# Patient Record
Sex: Female | Born: 1960 | Race: White | Hispanic: No | Marital: Married | Smoking: Current every day smoker
Health system: Southern US, Community
[De-identification: ages and names within clinical notes are randomized; demographics above are authoritative.]

## PROBLEM LIST (undated history)

## (undated) DIAGNOSIS — K7460 Unspecified cirrhosis of liver: Secondary | ICD-10-CM

## (undated) DIAGNOSIS — G43909 Migraine, unspecified, not intractable, without status migrainosus: Secondary | ICD-10-CM

## (undated) DIAGNOSIS — B1920 Unspecified viral hepatitis C without hepatic coma: Secondary | ICD-10-CM

## (undated) DIAGNOSIS — E7800 Pure hypercholesterolemia, unspecified: Secondary | ICD-10-CM

## (undated) DIAGNOSIS — F419 Anxiety disorder, unspecified: Secondary | ICD-10-CM

## (undated) DIAGNOSIS — M549 Dorsalgia, unspecified: Secondary | ICD-10-CM

## (undated) DIAGNOSIS — I10 Essential (primary) hypertension: Secondary | ICD-10-CM

## (undated) DIAGNOSIS — E119 Type 2 diabetes mellitus without complications: Secondary | ICD-10-CM

## (undated) DIAGNOSIS — M1990 Unspecified osteoarthritis, unspecified site: Secondary | ICD-10-CM

## (undated) HISTORY — PX: TONSILLECTOMY: SUR1361

## (undated) HISTORY — PX: TUBAL LIGATION: SHX77

## (undated) HISTORY — PX: CHOLECYSTECTOMY: SHX55

---

## 2006-08-11 ENCOUNTER — Emergency Department (HOSPITAL_COMMUNITY): Admission: EM | Admit: 2006-08-11 | Discharge: 2006-08-11 | Payer: Self-pay | Admitting: Emergency Medicine

## 2007-08-25 ENCOUNTER — Emergency Department (HOSPITAL_COMMUNITY): Admission: EM | Admit: 2007-08-25 | Discharge: 2007-08-25 | Payer: Self-pay | Admitting: Emergency Medicine

## 2010-09-24 DIAGNOSIS — M1990 Unspecified osteoarthritis, unspecified site: Secondary | ICD-10-CM

## 2010-09-24 HISTORY — DX: Unspecified osteoarthritis, unspecified site: M19.90

## 2013-02-11 ENCOUNTER — Emergency Department (HOSPITAL_COMMUNITY)
Admission: EM | Admit: 2013-02-11 | Discharge: 2013-02-11 | Disposition: A | Discharge status: Home / Self Care | Payer: Self-pay | Attending: Emergency Medicine | Admitting: Emergency Medicine

## 2013-02-11 ENCOUNTER — Encounter (HOSPITAL_COMMUNITY): Payer: Self-pay | Admitting: Emergency Medicine

## 2013-02-11 DIAGNOSIS — IMO0002 Reserved for concepts with insufficient information to code with codable children: Secondary | ICD-10-CM | POA: Insufficient documentation

## 2013-02-11 DIAGNOSIS — Z8639 Personal history of other endocrine, nutritional and metabolic disease: Secondary | ICD-10-CM | POA: Insufficient documentation

## 2013-02-11 DIAGNOSIS — F17200 Nicotine dependence, unspecified, uncomplicated: Secondary | ICD-10-CM | POA: Insufficient documentation

## 2013-02-11 DIAGNOSIS — Z862 Personal history of diseases of the blood and blood-forming organs and certain disorders involving the immune mechanism: Secondary | ICD-10-CM | POA: Insufficient documentation

## 2013-02-11 DIAGNOSIS — Y9289 Other specified places as the place of occurrence of the external cause: Secondary | ICD-10-CM | POA: Insufficient documentation

## 2013-02-11 DIAGNOSIS — S46909A Unspecified injury of unspecified muscle, fascia and tendon at shoulder and upper arm level, unspecified arm, initial encounter: Secondary | ICD-10-CM | POA: Insufficient documentation

## 2013-02-11 DIAGNOSIS — S8990XA Unspecified injury of unspecified lower leg, initial encounter: Secondary | ICD-10-CM | POA: Insufficient documentation

## 2013-02-11 DIAGNOSIS — S99929A Unspecified injury of unspecified foot, initial encounter: Secondary | ICD-10-CM | POA: Insufficient documentation

## 2013-02-11 DIAGNOSIS — F411 Generalized anxiety disorder: Secondary | ICD-10-CM | POA: Insufficient documentation

## 2013-02-11 DIAGNOSIS — Z79899 Other long term (current) drug therapy: Secondary | ICD-10-CM | POA: Insufficient documentation

## 2013-02-11 DIAGNOSIS — R296 Repeated falls: Secondary | ICD-10-CM | POA: Insufficient documentation

## 2013-02-11 DIAGNOSIS — W19XXXA Unspecified fall, initial encounter: Secondary | ICD-10-CM

## 2013-02-11 DIAGNOSIS — Y9389 Activity, other specified: Secondary | ICD-10-CM | POA: Insufficient documentation

## 2013-02-11 DIAGNOSIS — S4980XA Other specified injuries of shoulder and upper arm, unspecified arm, initial encounter: Secondary | ICD-10-CM | POA: Insufficient documentation

## 2013-02-11 DIAGNOSIS — I10 Essential (primary) hypertension: Secondary | ICD-10-CM | POA: Insufficient documentation

## 2013-02-11 HISTORY — DX: Anxiety disorder, unspecified: F41.9

## 2013-02-11 HISTORY — DX: Pure hypercholesterolemia, unspecified: E78.00

## 2013-02-11 HISTORY — DX: Essential (primary) hypertension: I10

## 2013-02-11 MED ORDER — IBUPROFEN 800 MG PO TABS
800.0000 mg | ORAL_TABLET | Freq: Once | ORAL | Status: AC
  Administered 2013-02-11: 800 mg via ORAL
  Filled 2013-02-11: qty 1

## 2013-02-11 MED ORDER — HYDROCODONE-ACETAMINOPHEN 5-325 MG PO TABS
1.0000 | ORAL_TABLET | Freq: Once | ORAL | Status: AC
  Administered 2013-02-11: 1 via ORAL
  Filled 2013-02-11: qty 1

## 2013-02-11 MED ORDER — HYDROCODONE-ACETAMINOPHEN 5-325 MG PO TABS: 1.0000 | ORAL_TABLET | ORAL | Status: DC | PRN

## 2013-02-11 NOTE — ED Notes (Signed)
Pt alert & oriented x4, stable gait. Patient given discharge instructions, paperwork & prescription(s). Patient  instructed to stop at the registration desk to finish any additional paperwork. Patient verbalized understanding. Pt left department w/ no further questions. 

## 2013-02-11 NOTE — ED Notes (Signed)
Pt states she feel Sunday getting out the shower, reports pain has just continued to get worse.

## 2013-02-11 NOTE — ED Provider Notes (Addendum)
History     CSN: [id number]  Arrival date & time 02/11/13  0309   First MD Initiated Contact with Patient 02/11/13 0501      Chief Complaint  Patient presents with  . Fall  . Leg Pain  . Shoulder Pain  . Back Pain    (Consider location/radiation/quality/duration/timing/severity/associated sxs/prior treatment) HPI HPI Comments: Melissa Huynh is a 52 y.o. female who presents to the Emergency Department complaining of pain to her back, left arm, right leg as a result in a fall in the shower on Sunday. She fell getting out of the shower and did not feel she had hurt herself. On Monday she began to feel pain in a variety of placed and took tylenol and ibuprofen. Yesterday the pain was worse and she has been unable to sleep. She is here for pain management. She denies fever, chills, weakness, inability to use extremities.     Past Medical History  Diagnosis Date  . Hypertension   . High cholesterol   . Anxiety     Past Surgical History  Procedure Laterality Date  . Cholecystectomy    . Tonsillectomy      No family history on file.  History  Substance Use Topics  . Smoking status: Current Every Day Smoker  . Smokeless tobacco: Not on file  . Alcohol Use: No    OB History   Grav Para Term Preterm Abortions TAB SAB Ect Mult Living                  Review of Systems  Constitutional: Negative for fever.       10  Systems reviewed and are negative for acute change except as noted in the HPI.  HENT: Negative for congestion.   Eyes: Negative for discharge and redness.  Respiratory: Negative for cough and shortness of breath.   Cardiovascular: Negative for chest pain.  Gastrointestinal: Negative for vomiting and abdominal pain.  Musculoskeletal: Positive for back pain.       Left arm pain, right leg pain  Skin: Negative for rash.  Neurological: Negative for syncope, numbness and headaches.  Psychiatric/Behavioral:       No behavior change.    Allergies  Cheese;  Flexeril; Penicillins; Toradol; and Tramadol  Home Medications   Current Outpatient Rx  Name  Route  Sig  Dispense  Refill  . clonazePAM (KLONOPIN) 0.5 MG tablet   Oral   Take 0.5 mg by mouth 3 (three) times daily as needed for anxiety.         Marland Kitchen lisinopril-hydrochlorothiazide (PRINZIDE,ZESTORETIC) 20-12.5 MG per tablet   Oral   Take 1 tablet by mouth 2 (two) times daily.           BP 118/60  Pulse 124  Temp(Src) 98.2 F (36.8 C) (Oral)  Resp 18  Ht 5\' 9"  (1.753 m)  Wt 189 lb (85.73 kg)  BMI 27.9 kg/m2  SpO2 97%  LMP 01/16/2013  Physical Exam  Nursing note and vitals reviewed. Constitutional: She appears well-developed and well-nourished.  Awake, alert, nontoxic appearance.  HENT:  Head: Normocephalic and atraumatic.  Right Ear: External ear normal.  Left Ear: External ear normal.  Eyes: EOM are normal. Pupils are equal, round, and reactive to light. Right eye exhibits no discharge. Left eye exhibits no discharge.  Neck: Normal range of motion. Neck supple.  Cardiovascular: Normal rate and intact distal pulses.   Pulmonary/Chest: Effort normal and breath sounds normal. She exhibits no tenderness.  Abdominal: Soft. Bowel  sounds are normal. There is no tenderness. There is no rebound.  Musculoskeletal: She exhibits no tenderness.  Baseline ROM, no obvious new focal weakness.NO spinal tenderness with percussion. Mild lower back pain with palpation. Left arm and shoulder with FROM, no bruising or lesions noted. Right leg with FROM, no bruising or lesions.   Neurological:  Mental status and motor strength appears baseline for patient and situation.  Skin: No rash noted.  Psychiatric: She has a normal mood and affect.    ED Course  Procedures (including critical care time)   MDM  Patient with pain to back, left arm and right leg after a fall on Sunday. Given hydrocodone. Pt stable in ED with no significant deterioration in condition.The patient appears reasonably  screened and/or stabilized for discharge and I doubt any other medical condition or other [facility] requiring further screening, evaluation, or treatment in the ED at this time prior to discharge.  MDM Reviewed: nursing note and vitals           Nicoletta Dress. Colon Branch, MD 02/11/13 4782  Nicoletta Dress. Colon Branch, MD 02/11/13 [phone]

## 2013-02-11 NOTE — ED Notes (Signed)
Patient states she fell Sunday morning while getting out of shower.  Patient c/o back pain, left shoulder pain and right leg pain.

## 2013-03-29 ENCOUNTER — Other Ambulatory Visit: Payer: Self-pay

## 2013-03-29 ENCOUNTER — Encounter (HOSPITAL_COMMUNITY): Payer: Self-pay | Admitting: *Deleted

## 2013-03-29 ENCOUNTER — Emergency Department (HOSPITAL_COMMUNITY)
Admission: EM | Admit: 2013-03-29 | Discharge: 2013-03-30 | Disposition: A | Discharge status: Home / Self Care | Payer: Self-pay | Attending: Emergency Medicine | Admitting: Emergency Medicine

## 2013-03-29 DIAGNOSIS — M79609 Pain in unspecified limb: Secondary | ICD-10-CM | POA: Insufficient documentation

## 2013-03-29 DIAGNOSIS — F411 Generalized anxiety disorder: Secondary | ICD-10-CM | POA: Insufficient documentation

## 2013-03-29 DIAGNOSIS — Z880 Allergy status to penicillin: Secondary | ICD-10-CM | POA: Insufficient documentation

## 2013-03-29 DIAGNOSIS — Z862 Personal history of diseases of the blood and blood-forming organs and certain disorders involving the immune mechanism: Secondary | ICD-10-CM | POA: Insufficient documentation

## 2013-03-29 DIAGNOSIS — F17200 Nicotine dependence, unspecified, uncomplicated: Secondary | ICD-10-CM | POA: Insufficient documentation

## 2013-03-29 DIAGNOSIS — Y92009 Unspecified place in unspecified non-institutional (private) residence as the place of occurrence of the external cause: Secondary | ICD-10-CM | POA: Insufficient documentation

## 2013-03-29 DIAGNOSIS — Z8639 Personal history of other endocrine, nutritional and metabolic disease: Secondary | ICD-10-CM | POA: Insufficient documentation

## 2013-03-29 DIAGNOSIS — T25229A Burn of second degree of unspecified foot, initial encounter: Secondary | ICD-10-CM | POA: Insufficient documentation

## 2013-03-29 DIAGNOSIS — Y939 Activity, unspecified: Secondary | ICD-10-CM | POA: Insufficient documentation

## 2013-03-29 DIAGNOSIS — M549 Dorsalgia, unspecified: Secondary | ICD-10-CM | POA: Insufficient documentation

## 2013-03-29 DIAGNOSIS — Z79899 Other long term (current) drug therapy: Secondary | ICD-10-CM | POA: Insufficient documentation

## 2013-03-29 DIAGNOSIS — X088XXA Exposure to other specified smoke, fire and flames, initial encounter: Secondary | ICD-10-CM | POA: Insufficient documentation

## 2013-03-29 DIAGNOSIS — I10 Essential (primary) hypertension: Secondary | ICD-10-CM | POA: Insufficient documentation

## 2013-03-29 DIAGNOSIS — G8929 Other chronic pain: Secondary | ICD-10-CM

## 2013-03-29 MED ORDER — IBUPROFEN 800 MG PO TABS: 800.0000 mg | ORAL_TABLET | Freq: Three times a day (TID) | ORAL | Status: DC

## 2013-03-29 MED ORDER — IBUPROFEN 800 MG PO TABS: 800.0000 mg | ORAL_TABLET | Freq: Once | ORAL | Status: DC
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
## 2013-03-30 MED ORDER — ACETAMINOPHEN 500 MG PO TABS
1000.0000 mg | ORAL_TABLET | Freq: Once | ORAL | Status: AC
  Administered 2013-03-30: 1000 mg via ORAL

## 2013-11-27 ENCOUNTER — Encounter (HOSPITAL_COMMUNITY): Payer: Self-pay | Admitting: Emergency Medicine

## 2013-11-27 ENCOUNTER — Emergency Department (HOSPITAL_COMMUNITY): Payer: Self-pay

## 2013-11-27 ENCOUNTER — Emergency Department (HOSPITAL_COMMUNITY)
Admission: EM | Admit: 2013-11-27 | Discharge: 2013-11-28 | Disposition: A | Discharge status: Home / Self Care | Payer: Self-pay | Attending: Emergency Medicine | Admitting: Emergency Medicine

## 2013-11-27 DIAGNOSIS — M51369 Other intervertebral disc degeneration, lumbar region without mention of lumbar back pain or lower extremity pain: Secondary | ICD-10-CM

## 2013-11-27 DIAGNOSIS — Y9289 Other specified places as the place of occurrence of the external cause: Secondary | ICD-10-CM | POA: Insufficient documentation

## 2013-11-27 DIAGNOSIS — M5137 Other intervertebral disc degeneration, lumbosacral region: Secondary | ICD-10-CM | POA: Insufficient documentation

## 2013-11-27 DIAGNOSIS — E119 Type 2 diabetes mellitus without complications: Secondary | ICD-10-CM | POA: Insufficient documentation

## 2013-11-27 DIAGNOSIS — F411 Generalized anxiety disorder: Secondary | ICD-10-CM | POA: Insufficient documentation

## 2013-11-27 DIAGNOSIS — Z791 Long term (current) use of non-steroidal anti-inflammatories (NSAID): Secondary | ICD-10-CM | POA: Insufficient documentation

## 2013-11-27 DIAGNOSIS — M51379 Other intervertebral disc degeneration, lumbosacral region without mention of lumbar back pain or lower extremity pain: Secondary | ICD-10-CM | POA: Insufficient documentation

## 2013-11-27 DIAGNOSIS — Z79899 Other long term (current) drug therapy: Secondary | ICD-10-CM | POA: Insufficient documentation

## 2013-11-27 DIAGNOSIS — Z862 Personal history of diseases of the blood and blood-forming organs and certain disorders involving the immune mechanism: Secondary | ICD-10-CM | POA: Insufficient documentation

## 2013-11-27 DIAGNOSIS — S39012A Strain of muscle, fascia and tendon of lower back, initial encounter: Secondary | ICD-10-CM

## 2013-11-27 DIAGNOSIS — W010XXA Fall on same level from slipping, tripping and stumbling without subsequent striking against object, initial encounter: Secondary | ICD-10-CM | POA: Insufficient documentation

## 2013-11-27 DIAGNOSIS — Z880 Allergy status to penicillin: Secondary | ICD-10-CM | POA: Insufficient documentation

## 2013-11-27 DIAGNOSIS — Y9389 Activity, other specified: Secondary | ICD-10-CM | POA: Insufficient documentation

## 2013-11-27 DIAGNOSIS — I10 Essential (primary) hypertension: Secondary | ICD-10-CM | POA: Insufficient documentation

## 2013-11-27 DIAGNOSIS — W1809XA Striking against other object with subsequent fall, initial encounter: Secondary | ICD-10-CM | POA: Insufficient documentation

## 2013-11-27 DIAGNOSIS — F17200 Nicotine dependence, unspecified, uncomplicated: Secondary | ICD-10-CM | POA: Insufficient documentation

## 2013-11-27 DIAGNOSIS — Z8639 Personal history of other endocrine, nutritional and metabolic disease: Secondary | ICD-10-CM | POA: Insufficient documentation

## 2013-11-27 DIAGNOSIS — M5136 Other intervertebral disc degeneration, lumbar region: Secondary | ICD-10-CM

## 2013-11-27 DIAGNOSIS — S335XXA Sprain of ligaments of lumbar spine, initial encounter: Secondary | ICD-10-CM | POA: Insufficient documentation

## 2013-11-27 HISTORY — DX: Type 2 diabetes mellitus without complications: E11.9

## 2013-11-27 MED ORDER — HYDROCODONE-ACETAMINOPHEN 5-325 MG PO TABS: 1.0000 | ORAL_TABLET | ORAL | Status: DC | PRN

## 2013-11-27 MED ORDER — DIAZEPAM 5 MG PO TABS
5.0000 mg | ORAL_TABLET | Freq: Once | ORAL | Status: AC
Start: 2013-11-27 — End: 2013-11-27
  Administered 2013-11-27: 5 mg via ORAL
  Filled 2013-11-27: qty 1

## 2013-11-27 MED ORDER — HYDROCODONE-ACETAMINOPHEN 5-325 MG PO TABS
1.0000 | ORAL_TABLET | Freq: Once | ORAL | Status: AC
  Administered 2013-11-27: 1 via ORAL
  Filled 2013-11-27: qty 1

## 2013-11-27 MED ORDER — ONDANSETRON HCL 4 MG PO TABS
4.0000 mg | ORAL_TABLET | Freq: Once | ORAL | Status: AC
  Administered 2013-11-27: 4 mg via ORAL
  Filled 2013-11-27: qty 1

## 2013-11-27 MED ORDER — DIAZEPAM 5 MG PO TABS: 5.0000 mg | ORAL_TABLET | Freq: Three times a day (TID) | ORAL | Status: DC

## 2013-11-27 MED ORDER — MORPHINE SULFATE 4 MG/ML IJ SOLN
8.0000 mg | Freq: Once | INTRAMUSCULAR | Status: AC
  Administered 2013-11-27: 8 mg via INTRAMUSCULAR
  Filled 2013-11-27: qty 2

## 2013-11-27 NOTE — ED Provider Notes (Signed)
CSN: [id number]     Arrival date & time 11/27/13  2039 History   This chart was scribed for Ivery Quale by Ladona Ridgel Day, ED scribe. This patient was seen in room APFT21/APFT21 and the patient's care was started at 2039.  Chief Complaint  Patient presents with  . Fall   The history is provided by the patient. No language interpreter was used.   HPI Comments: Melissa Huynh is a 53 y.o. female who presents to the Emergency Department complaining of sudden onset back pain after a fall while getting out of a vehicle, slipped on ice and hit her chin on a handhold and then fell to the ground; this fall occured yesterday AM at 930, she denies any hx of chronic back problems. She takes no blood thinner medicines and has no bleeding disorders. She reports a similar previous episode years ago in which she injured her back. Today she reports painful ROM with her back and having muscle soreness all over.  Past Medical History  Diagnosis Date  . Hypertension   . High cholesterol   . Anxiety   . Diabetes mellitus without complication    Past Surgical History  Procedure Laterality Date  . Cholecystectomy    . Tonsillectomy    . Cesarean section    . Tubal ligation     History reviewed. No pertinent family history. History  Substance Use Topics  . Smoking status: Current Every Day Smoker    Types: Cigarettes  . Smokeless tobacco: Not on file  . Alcohol Use: No   OB History   Grav Para Term Preterm Abortions TAB SAB Ect Mult Living                 Review of Systems  Constitutional: Negative for fever and chills.  Respiratory: Negative for cough and shortness of breath.   Cardiovascular: Negative for chest pain.  Gastrointestinal: Negative for abdominal pain.  Musculoskeletal: Positive for back pain.  All other systems reviewed and are negative.    Allergies  Cheese; Flexeril; Penicillins; Toradol; and Tramadol  Home Medications   Current Outpatient Rx  Name  Route  Sig  Dispense   Refill  . clonazePAM (KLONOPIN) 0.5 MG tablet   Oral   Take 0.5 mg by mouth 3 (three) times daily as needed for anxiety.         Marland Kitchen ibuprofen (ADVIL,MOTRIN) 800 MG tablet   Oral   Take 1 tablet (800 mg total) by mouth 3 (three) times daily.   21 tablet   0   . lisinopril-hydrochlorothiazide (PRINZIDE,ZESTORETIC) 20-12.5 MG per tablet   Oral   Take 1 tablet by mouth 2 (two) times daily.         . metFORMIN (GLUCOPHAGE) 500 MG tablet   Oral   Take 500 mg by mouth 2 (two) times daily with a meal.         . metoprolol tartrate (LOPRESSOR) 25 MG tablet   Oral   Take 25 mg by mouth 2 (two) times daily.          Triage Vitals: BP 149/93  Pulse 64  Temp(Src) 97.5 F (36.4 C) (Oral)  Resp 20  Ht 6' (1.829 m)  Wt 185 lb (83.915 kg)  BMI 25.08 kg/m2  SpO2 99%  LMP 11/20/2013  Physical Exam  Nursing note and vitals reviewed. Constitutional: She is oriented to person, place, and time. She appears well-developed and well-nourished. No distress.  HENT:  Head: Normocephalic and atraumatic.  No mandible step off  Eyes: Conjunctivae are normal. Right eye exhibits no discharge. Left eye exhibits no discharge.  Neck: Normal range of motion.  Cardiovascular: Normal rate, regular rhythm and normal heart sounds.   No murmur heard. Pulmonary/Chest: Effort normal and breath sounds normal. No respiratory distress. She has no wheezes. She has no rales.  Abdominal: Bowel sounds are normal.  Musculoskeletal: Normal range of motion. She exhibits tenderness. She exhibits no edema.  Right and left lumbar paraspinal tenderness. Tenderness over the lumbar spine but no palpable step off. No visible hematoma of the back.   Neurological: She is alert and oriented to person, place, and time.  Skin: Skin is warm and dry.  Psychiatric: She has a normal mood and affect. Thought content normal.    ED Course  Procedures (including critical care time) DIAGNOSTIC STUDIES: Oxygen Saturation is 99%  on room air, normal by my interpretation.    COORDINATION OF CARE: At 1021 PM Discussed treatment plan with patient which includes pain medicine, lumbar spine X-ray. Patient agrees.   Labs Review Labs Reviewed - No data to display Imaging Review No results found.   EKG Interpretation None      MDM Pt sustained a fall and re-injured the lower back. Pt L spine reveals ddd. There is a healing transverse process fx . This is believed to have been present on a Feb xray. Pt [place] Rx for valium and norco given to the patient. Pt to apply heat to the lower back and rest back.   Final diagnoses:  None    *I have reviewed nursing notes, vital signs, and all appropriate lab and imaging results for this patient.**  I personally performed the services described in this documentation, which was scribed in my presence. The recorded information has been reviewed and is accurate.     Kathie DikeHobson M Kie Calvin, PA-C 11/28/13 [phone]
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
## 2013-11-27 NOTE — ED Notes (Signed)
Fell on ice when getting out of car yesterday, back pain, struck chin on door handle of car.  No LOC,  Back of head struck ground.   Neck is "stiff"

## 2013-11-28 NOTE — ED Provider Notes (Signed)
Medical screening examination/treatment/procedure(s) were performed by non-physician practitioner and as supervising physician I was immediately available for consultation/collaboration.   EKG Interpretation None        Tullio Chausse N Elyna Pangilinan, DO 11/28/13 0112 

## 2013-12-03 ENCOUNTER — Emergency Department (HOSPITAL_COMMUNITY)
Admission: EM | Admit: 2013-12-03 | Discharge: 2013-12-04 | Disposition: A | Discharge status: Home / Self Care | Payer: Self-pay | Attending: Emergency Medicine | Admitting: Emergency Medicine

## 2013-12-03 ENCOUNTER — Encounter (HOSPITAL_COMMUNITY): Payer: Self-pay | Admitting: Emergency Medicine

## 2013-12-03 DIAGNOSIS — F411 Generalized anxiety disorder: Secondary | ICD-10-CM | POA: Insufficient documentation

## 2013-12-03 DIAGNOSIS — I10 Essential (primary) hypertension: Secondary | ICD-10-CM | POA: Insufficient documentation

## 2013-12-03 DIAGNOSIS — F17200 Nicotine dependence, unspecified, uncomplicated: Secondary | ICD-10-CM | POA: Insufficient documentation

## 2013-12-03 DIAGNOSIS — R300 Dysuria: Secondary | ICD-10-CM | POA: Insufficient documentation

## 2013-12-03 DIAGNOSIS — IMO0002 Reserved for concepts with insufficient information to code with codable children: Secondary | ICD-10-CM | POA: Insufficient documentation

## 2013-12-03 DIAGNOSIS — M5416 Radiculopathy, lumbar region: Secondary | ICD-10-CM

## 2013-12-03 DIAGNOSIS — Z79899 Other long term (current) drug therapy: Secondary | ICD-10-CM | POA: Insufficient documentation

## 2013-12-03 DIAGNOSIS — Z880 Allergy status to penicillin: Secondary | ICD-10-CM | POA: Insufficient documentation

## 2013-12-03 DIAGNOSIS — E119 Type 2 diabetes mellitus without complications: Secondary | ICD-10-CM | POA: Insufficient documentation

## 2013-12-03 DIAGNOSIS — Z791 Long term (current) use of non-steroidal anti-inflammatories (NSAID): Secondary | ICD-10-CM | POA: Insufficient documentation

## 2013-12-03 MED ORDER — HYDROMORPHONE HCL PF 2 MG/ML IJ SOLN
2.0000 mg | Freq: Once | INTRAMUSCULAR | Status: AC
  Administered 2013-12-04: 2 mg via INTRAMUSCULAR
  Filled 2013-12-03: qty 1

## 2013-12-03 NOTE — ED Provider Notes (Signed)
CSN: [id number]     Arrival date & time 12/03/13  2251 History  This chart was scribed for Shon Batonourtney F Othel Dicostanzo, MD by Bennett Scrapehristina Taylor, ED Scribe. This patient was seen in room APA19/APA19 and the patient's care was started at 11:50 PM.    Chief Complaint  Patient presents with  . Back Pain    The history is provided by the patient. No language interpreter was used.    HPI Comments: Melissa Huynh is a 53 y.o. female who presents to the Emergency Department complaining of severe lower back pain that stared worsening yesterday. She was seen last week for the same back pain and had x-rays done that showed an old lower lumbar fx and DDD.  She was started on pain medications (Norco & Valium) and given a [facility]. She states that she has an appointment with specialist next week. Since then,  states that the pain has been worsening with new radiation down the right leg and new associated decreased sensation in the right foot. She has been taking IBU 800 mg every 4 to 6 hours with no improvement since running out of the pain medications yesterday. She states that she has started walking with a cane due to weakness felt secondary to the pain. At baseline, she ambulates unassisted. She denies any urinary or bowel incontinence or saddle anesthesia. She has a h/o HTN and is on 20 mg lisinopril BID and 25 mg lopressor BID.  Past Medical History  Diagnosis Date  . Hypertension   . High cholesterol   . Anxiety   . Diabetes mellitus without complication    Past Surgical History  Procedure Laterality Date  . Cholecystectomy    . Tonsillectomy    . Cesarean section    . Tubal ligation     No family history on file. History  Substance Use Topics  . Smoking status: Current Every Day Smoker    Types: Cigarettes  . Smokeless tobacco: Not on file  . Alcohol Use: No   No OB history provided.  Review of Systems  Constitutional: Negative for fever.  Respiratory: Negative for  cough, chest tightness and shortness of breath.   Cardiovascular: Negative for chest pain.  Gastrointestinal: Negative for abdominal pain.  Genitourinary: Positive for dysuria.       Denies urinary retention  Musculoskeletal: Positive for back pain and gait problem.  Skin: Negative for wound.  Neurological: Positive for weakness and numbness. Negative for headaches.  Psychiatric/Behavioral: Negative for confusion.  All other systems reviewed and are negative.      Allergies  Cheese; Flexeril; Penicillins; Toradol; and Tramadol  Home Medications   Current Outpatient Rx  Name  Route  Sig  Dispense  Refill  . clonazePAM (KLONOPIN) 0.5 MG tablet   Oral   Take 0.5 mg by mouth 3 (three) times daily as needed for anxiety.         Marland Kitchen. lisinopril-hydrochlorothiazide (PRINZIDE,ZESTORETIC) 20-12.5 MG per tablet   Oral   Take 1 tablet by mouth 2 (two) times daily.         . metFORMIN (GLUCOPHAGE) 500 MG tablet   Oral   Take 500 mg by mouth 2 (two) times daily with a meal.         . metoprolol tartrate (LOPRESSOR) 25 MG tablet   Oral   Take 25 mg by mouth 2 (two) times daily.         . diazepam (VALIUM) 5 MG tablet  Oral   Take 1 tablet (5 mg total) by mouth 3 (three) times daily after meals.   21 tablet   0   . HYDROcodone-acetaminophen (NORCO) 5-325 MG per tablet   Oral   Take 1 tablet by mouth every 4 (four) hours as needed for moderate pain.   20 tablet   0   . ibuprofen (ADVIL,MOTRIN) 800 MG tablet   Oral   Take 1 tablet (800 mg total) by mouth 3 (three) times daily.   21 tablet   0   . methylPREDNIsolone (MEDROL DOSPACK) 4 MG tablet      follow package directions   21 tablet   0   . oxyCODONE-acetaminophen (PERCOCET/ROXICET) 5-325 MG per tablet   Oral   Take 1 tablet by mouth every 6 (six) hours as needed for severe pain.   15 tablet   0    Triage Vitals: BP 128/82  Pulse 67  Temp(Src) 98.1 F (36.7 C) (Oral)  Resp 18  Ht 6' (1.829 m)  Wt 187  lb (84.823 kg)  BMI 25.36 kg/m2  SpO2 98%  LMP 11/20/2013  Physical Exam  Nursing note and vitals reviewed. Constitutional: She is oriented to person, place, and time. She appears well-developed and well-nourished.  Uncomfortable appearing  HENT:  Head: Normocephalic and atraumatic.  Cardiovascular: Normal rate, regular rhythm and normal heart sounds.   No murmur heard. Pulmonary/Chest: Effort normal and breath sounds normal. No respiratory distress. She has no wheezes.  Abdominal: Soft. There is no tenderness.  Musculoskeletal:  No midline tenderness to palpation, no step off or deformity noted  Neurological: She is alert and oriented to person, place, and time.  5 out of 5 strength in the bilateral hip flexors, knee extensors, knee flexors, plantar flexion, and dorsiflexion. 1+ bilateral lower extremity patellar reflexes. No clonus noted. Decreased sensation over the dorsum of the right foot throughout all nerve distributions.  Skin: Skin is warm and dry.  Psychiatric: She has a normal mood and affect.    ED Course  Procedures (including critical care time)  DIAGNOSTIC STUDIES: Oxygen Saturation is 98% on RA, normal by my interpretation.    COORDINATION OF CARE:   Labs Review Labs Reviewed  URINALYSIS, ROUTINE W REFLEX MICROSCOPIC   Imaging Review No results found.   EKG Interpretation None      MDM   Final diagnoses:  Lumbar radicular pain   Patient presents with back pain. Patient reports worsening pain and now is having radicular symptoms including decreased sensation of the right foot. On neurologic exam she has good strength bilaterally and intact reflexes. She does have decreased sensation but not over a specific nerve root. No evidence of cauda equina. I have reviewed patient's recent x-rays which showed an old TP fracture and degenerative disc disease. Suspect patient may have a herniated disc. Do not have access to MRI at this time. Given that the patient  does not have any evidence of acute cauda equina, feel patient can have an urgent MRI as an outpatient tomorrow. Patient was given IM Dilaudid and reports improvement of her pain. Urinalysis was obtained given her symptoms of dysuria and shows no evidence of infection. MRI was ordered. Patient was given strict return precautions.  After history, exam, and medical workup I feel the patient has been appropriately medically screened and is safe for discharge home. Pertinent diagnoses were discussed with the patient. Patient was given return precautions.      Shon Baton, MD 12/04/13 [phone]

## 2013-12-03 NOTE — ED Notes (Signed)
Pt states she was seen here a week ago and diagnosed with a   "fracture in my lower back"  Pt states she ran out of her pain meds 2 days ago and is having severe pain.

## 2013-12-04 LAB — URINALYSIS, ROUTINE W REFLEX MICROSCOPIC
Bilirubin Urine: NEGATIVE
GLUCOSE, UA: NEGATIVE mg/dL
HGB URINE DIPSTICK: NEGATIVE
KETONES UR: NEGATIVE mg/dL
LEUKOCYTES UA: NEGATIVE
Nitrite: NEGATIVE
PROTEIN: NEGATIVE mg/dL
Specific Gravity, Urine: 1.015 (ref 1.005–1.030)
UROBILINOGEN UA: 0.2 mg/dL (ref 0.0–1.0)
pH: 5.5 (ref 5.0–8.0)

## 2013-12-04 MED ORDER — OXYCODONE-ACETAMINOPHEN 5-325 MG PO TABS
1.0000 | ORAL_TABLET | Freq: Four times a day (QID) | ORAL | Status: DC | PRN
Start: 2013-12-04 — End: 2014-01-23

## 2013-12-04 MED ORDER — METHYLPREDNISOLONE (PAK) 4 MG PO TABS: ORAL_TABLET | ORAL | Status: DC

## 2013-12-04 NOTE — Discharge Instructions (Signed)
Radicular Pain  You were seen for back pain. Your noted on prior imaging to have degenerative disc disease. Her treated for her pain. If no evidence of cord impingement at this time. However, given your symptoms may have a herniated disc. MRI will be scheduled for you tomorrow.  Radicular pain in either the arm or leg is usually from a bulging or herniated disk in the spine. A piece of the herniated disk may press against the nerves as the nerves exit the spine. This causes pain which is felt at the tips of the nerves down the arm or leg. Other causes of radicular pain may include:  Fractures.  Heart disease.  Cancer.  An abnormal and usually degenerative state of the nervous system or nerves (neuropathy). Diagnosis may require CT or MRI scanning to determine the primary cause.  Nerves that start at the neck (nerve roots) may cause radicular pain in the outer shoulder and arm. It can spread down to the thumb and fingers. The symptoms vary depending on which nerve root has been affected. In most cases radicular pain improves with conservative treatment. Neck problems may require physical therapy, a neck collar, or cervical traction. Treatment may take many weeks, and surgery may be considered if the symptoms do not improve.  Conservative treatment is also recommended for sciatica. Sciatica causes pain to radiate from the lower back or buttock area down the leg into the foot. Often there is a history of back problems. Most patients with sciatica are better after 2 to 4 weeks of rest and other supportive care. Short term bed rest can reduce the disk pressure considerably. Sitting, however, is not a good position since this increases the pressure on the disk. You should avoid bending, lifting, and all other activities which make the problem worse. Traction can be used in severe cases. Surgery is usually reserved for patients who do not improve within the first months of treatment. Only take  over-the-counter or prescription medicines for pain, discomfort, or fever as directed by your caregiver. Narcotics and muscle relaxants may help by relieving more severe pain and spasm and by providing mild sedation. Cold or massage can give significant relief. Spinal manipulation is not recommended. It can increase the degree of disc protrusion. Epidural steroid injections are often effective treatment for radicular pain. These injections deliver medicine to the spinal nerve in the space between the protective covering of the spinal cord and back bones (vertebrae). Your caregiver can give you more information about steroid injections. These injections are most effective when given within two weeks of the onset of pain.  You should see your caregiver for follow up care as recommended. A program for neck and back injury rehabilitation with stretching and strengthening exercises is an important part of management.  SEEK IMMEDIATE MEDICAL CARE IF:  You develop increased pain, weakness, or numbness in your arm or leg.  You develop difficulty with bladder or bowel control.  You develop abdominal pain. Document Released: 10/18/2004 Document Revised: 12/03/2011 Document Reviewed: 01/03/2009 [facility] Patient Information 2014 [place], [place].

## 2013-12-14 ENCOUNTER — Emergency Department (HOSPITAL_COMMUNITY)
Admission: EM | Admit: 2013-12-14 | Discharge: 2013-12-14 | Disposition: A | Discharge status: Home / Self Care | Payer: Self-pay | Attending: Emergency Medicine | Admitting: Emergency Medicine

## 2013-12-14 ENCOUNTER — Encounter (HOSPITAL_COMMUNITY): Payer: Self-pay | Admitting: Emergency Medicine

## 2013-12-14 DIAGNOSIS — I10 Essential (primary) hypertension: Secondary | ICD-10-CM | POA: Insufficient documentation

## 2013-12-14 DIAGNOSIS — F411 Generalized anxiety disorder: Secondary | ICD-10-CM | POA: Insufficient documentation

## 2013-12-14 DIAGNOSIS — Z880 Allergy status to penicillin: Secondary | ICD-10-CM | POA: Insufficient documentation

## 2013-12-14 DIAGNOSIS — Z791 Long term (current) use of non-steroidal anti-inflammatories (NSAID): Secondary | ICD-10-CM | POA: Insufficient documentation

## 2013-12-14 DIAGNOSIS — G8929 Other chronic pain: Secondary | ICD-10-CM | POA: Insufficient documentation

## 2013-12-14 DIAGNOSIS — Z79899 Other long term (current) drug therapy: Secondary | ICD-10-CM | POA: Insufficient documentation

## 2013-12-14 DIAGNOSIS — M549 Dorsalgia, unspecified: Secondary | ICD-10-CM | POA: Insufficient documentation

## 2013-12-14 DIAGNOSIS — E119 Type 2 diabetes mellitus without complications: Secondary | ICD-10-CM | POA: Insufficient documentation

## 2013-12-14 DIAGNOSIS — F17200 Nicotine dependence, unspecified, uncomplicated: Secondary | ICD-10-CM | POA: Insufficient documentation

## 2013-12-14 MED ORDER — HYDROCODONE-ACETAMINOPHEN 5-325 MG PO TABS: 1.0000 | ORAL_TABLET | Freq: Four times a day (QID) | ORAL | Status: DC | PRN

## 2013-12-14 MED ORDER — MORPHINE SULFATE 4 MG/ML IJ SOLN
4.0000 mg | Freq: Once | INTRAMUSCULAR | Status: AC
  Administered 2013-12-14: 4 mg via INTRAMUSCULAR
  Filled 2013-12-14: qty 1

## 2013-12-14 NOTE — ED Notes (Signed)
C/o pain to legs while urinating but has constant pain to mid back as well, denies burning on urination, one episode of incont of urine but admits to having a "low bladder" and that bladder has dropped

## 2013-12-14 NOTE — ED Provider Notes (Signed)
CSN: [id number]     Arrival date & time 12/14/13  1121 History   First MD Initiated Contact with Patient 12/14/13 1133     Chief Complaint  Patient presents with  . Back Pain     (Consider location/radiation/quality/duration/timing/severity/associated sxs/prior Treatment) The history is provided by the patient. No language interpreter was used.  Pt is a 53 year old female who presents with back pain. He reports that this is his typical chronic back pain. He denies numbness or tingling. No urinary symptoms. He reports that he tried to get into his PCP and was unable.   Past Medical History  Diagnosis Date  . Hypertension   . High cholesterol   . Anxiety   . Diabetes mellitus without complication    Past Surgical History  Procedure Laterality Date  . Cholecystectomy    . Tonsillectomy    . Cesarean section    . Tubal ligation     No family history on file. History  Substance Use Topics  . Smoking status: Current Every Day Smoker    Types: Cigarettes  . Smokeless tobacco: Not on file  . Alcohol Use: No   OB History   Grav Para Term Preterm Abortions TAB SAB Ect Mult Living                 Review of Systems  Genitourinary: Negative for dysuria, urgency and decreased urine volume.  Musculoskeletal: Positive for back pain. Negative for gait problem and joint swelling.  Neurological: Negative for weakness and numbness.  All other systems reviewed and are negative.      Allergies  Cheese; Flexeril; Penicillins; Toradol; and Tramadol  Home Medications   Current Outpatient Rx  Name  Route  Sig  Dispense  Refill  . clonazePAM (KLONOPIN) 0.5 MG tablet   Oral   Take 0.5 mg by mouth 3 (three) times daily as needed for anxiety.         Marland Kitchen HYDROcodone-acetaminophen (NORCO) 5-325 MG per tablet   Oral   Take 1 tablet by mouth every 4 (four) hours as needed for moderate pain.   20 tablet   0   . ibuprofen (ADVIL,MOTRIN) 800 MG tablet   Oral   Take 1 tablet (800 mg  total) by mouth 3 (three) times daily.   21 tablet   0   . lisinopril-hydrochlorothiazide (PRINZIDE,ZESTORETIC) 20-12.5 MG per tablet   Oral   Take 1 tablet by mouth 2 (two) times daily.         . metFORMIN (GLUCOPHAGE) 500 MG tablet   Oral   Take 500 mg by mouth 2 (two) times daily with a meal.         . metoprolol tartrate (LOPRESSOR) 25 MG tablet   Oral   Take 25 mg by mouth 2 (two) times daily.         Marland Kitchen oxyCODONE-acetaminophen (PERCOCET/ROXICET) 5-325 MG per tablet   Oral   Take 1 tablet by mouth every 6 (six) hours as needed for severe pain.   15 tablet   0   . methylPREDNIsolone (MEDROL DOSPACK) 4 MG tablet      follow package directions   21 tablet   0    BP 115/74  Pulse 75  Temp(Src) 98.2 F (36.8 C)  Resp 18  Ht 6' (1.829 m)  Wt 180 lb (81.647 kg)  BMI 24.41 kg/m2  SpO2 98%  LMP 11/20/2013 Physical Exam  Nursing note and vitals reviewed. Constitutional: She is oriented to  person, place, and time. She appears well-developed and well-nourished. No distress.  HENT:  Head: Normocephalic and atraumatic.  Eyes: Conjunctivae and EOM are normal.  Cardiovascular: Normal rate, regular rhythm and normal heart sounds.   Pulmonary/Chest: Effort normal and breath sounds normal. No respiratory distress. She has no wheezes.  Musculoskeletal: Normal range of motion.  Neurological: She is alert and oriented to person, place, and time.  Skin: Skin is warm and dry.  Psychiatric: She has a normal mood and affect. Her behavior is normal. Judgment and thought content normal.    ED Course  Procedures (including critical care time) Labs Review Labs Reviewed - No data to display Imaging Review No results found.   EKG Interpretation None      MDM   Final diagnoses:  Back pain    Morphine injection given. No numbness or tingling. Good ROM, no urinary symptoms, good strength and sensation. No gait disturbances, focal deficits or weakness. Discussed plan of  care with pt. And advised to follow-up with PCP. Prescription for for hydrocodone given.       Irish EldersKelly Anahlia Iseminger, NP 12/18/13 1309

## 2013-12-14 NOTE — Discharge Instructions (Signed)
Back Pain, Adult Low back pain is very common. About 1 in 5 people have back pain.The cause of low back pain is rarely dangerous. The pain often gets better over time.About half of people with a sudden onset of back pain feel better in just 2 weeks. About 8 in 10 people feel better by 6 weeks.  CAUSES Some common causes of back pain include:  Strain of the muscles or ligaments supporting the spine.  Wear and tear (degeneration) of the spinal discs.  Arthritis.  Direct injury to the back. DIAGNOSIS Most of the time, the direct cause of low back pain is not known.However, back pain can be treated effectively even when the exact cause of the pain is unknown.Answering your caregiver's questions about your overall health and symptoms is one of the most accurate ways to make sure the cause of your pain is not dangerous. If your caregiver needs more information, he or she may order lab work or imaging tests (X-rays or MRIs).However, even if imaging tests show changes in your back, this usually does not require surgery. HOME CARE INSTRUCTIONS For many people, back pain returns.Since low back pain is rarely dangerous, it is often a condition that people can learn to [facility] their own.   Remain active. It is stressful on the back to sit or stand in one place. Do not sit, drive, or stand in one place for more than 30 minutes at a time. Take short walks on level surfaces as soon as pain allows.Try to increase the length of time you walk each day.  Do not stay in bed.Resting more than 1 or 2 days can delay your recovery.  Do not avoid exercise or work.Your body is made to move.It is not dangerous to be active, even though your back may hurt.Your back will likely heal faster if you return to being active before your pain is gone.  Pay attention to your body when you bend and lift. Many people have less discomfortwhen lifting if they bend their knees, keep the load close to their bodies,and  avoid twisting. Often, the most comfortable positions are those that put less stress on your recovering back.  Find a comfortable position to sleep. Use a firm mattress and lie on your side with your knees slightly bent. If you lie on your back, put a pillow under your knees.  Only take over-the-counter or prescription medicines as directed by your caregiver. Over-the-counter medicines to reduce pain and inflammation are often the most helpful.Your caregiver may prescribe muscle relaxant drugs.These medicines help dull your pain so you can more quickly return to your normal activities and healthy exercise.  Put ice on the injured area.  Put ice in a plastic bag.  Place a towel between your skin and the bag.  Leave the ice on for 15-20 minutes, 03-04 times a day for the first 2 to 3 days. After that, ice and heat may be alternated to reduce pain and spasms.  Ask your caregiver about trying back exercises and gentle massage. This may be of some benefit.  Avoid feeling anxious or stressed.Stress increases muscle tension and can worsen back pain.It is important to recognize when you are anxious or stressed and learn ways to manage it.Exercise is a great option. SEEK MEDICAL CARE IF:  You have pain that is not relieved with rest or medicine.  You have pain that does not improve in 1 week.  You have new symptoms.  You are generally not feeling well. SEEK  IMMEDIATE MEDICAL CARE IF:   You have pain that radiates from your back into your legs.  You develop new bowel or bladder control problems.  You have unusual weakness or numbness in your arms or legs.  You develop nausea or vomiting.  You develop abdominal pain.  You feel faint. Document Released: 09/10/2005 Document Revised: 03/11/2012 Document Reviewed: 01/29/2011 [facility] IncExitCare Patient Information 2014 [place]ExitCare, [place]LLC.    Follow-up with your doctor at the clinic Take meds as prescribed

## 2013-12-14 NOTE — ED Notes (Signed)
Pt here for continued back pain. Pt states she was unable to be seen at pcp office due to issues with insurance and income.

## 2013-12-14 NOTE — ED Notes (Signed)
Latrelle DodrillK. Walker, NP at bedside

## 2013-12-14 NOTE — ED Notes (Signed)
Pt with continued back pain, had an appt today but unable to see due to pt's financial status, states she finished Medrol pack from last visit and has ran out of pain meds as well

## 2013-12-21 NOTE — ED Provider Notes (Signed)
Medical screening examination/treatment/procedure(s) were performed by non-physician practitioner and as supervising physician I was immediately available for consultation/collaboration.   EKG Interpretation None        Chandlar Staebell W. Quayshawn Nin, MD 12/21/13 0737 

## 2014-01-23 ENCOUNTER — Emergency Department (HOSPITAL_COMMUNITY): Payer: Self-pay

## 2014-01-23 ENCOUNTER — Emergency Department (HOSPITAL_COMMUNITY)
Admission: EM | Admit: 2014-01-23 | Discharge: 2014-01-23 | Disposition: A | Discharge status: Home / Self Care | Payer: Self-pay | Attending: Emergency Medicine | Admitting: Emergency Medicine

## 2014-01-23 ENCOUNTER — Encounter (HOSPITAL_COMMUNITY): Payer: Self-pay | Admitting: Emergency Medicine

## 2014-01-23 DIAGNOSIS — I10 Essential (primary) hypertension: Secondary | ICD-10-CM | POA: Insufficient documentation

## 2014-01-23 DIAGNOSIS — Z79899 Other long term (current) drug therapy: Secondary | ICD-10-CM | POA: Insufficient documentation

## 2014-01-23 DIAGNOSIS — E119 Type 2 diabetes mellitus without complications: Secondary | ICD-10-CM | POA: Insufficient documentation

## 2014-01-23 DIAGNOSIS — M545 Low back pain, unspecified: Secondary | ICD-10-CM

## 2014-01-23 DIAGNOSIS — Z880 Allergy status to penicillin: Secondary | ICD-10-CM | POA: Insufficient documentation

## 2014-01-23 DIAGNOSIS — Y9389 Activity, other specified: Secondary | ICD-10-CM | POA: Insufficient documentation

## 2014-01-23 DIAGNOSIS — Y929 Unspecified place or not applicable: Secondary | ICD-10-CM | POA: Insufficient documentation

## 2014-01-23 DIAGNOSIS — F17200 Nicotine dependence, unspecified, uncomplicated: Secondary | ICD-10-CM | POA: Insufficient documentation

## 2014-01-23 DIAGNOSIS — S5001XA Contusion of right elbow, initial encounter: Secondary | ICD-10-CM

## 2014-01-23 DIAGNOSIS — IMO0002 Reserved for concepts with insufficient information to code with codable children: Secondary | ICD-10-CM | POA: Insufficient documentation

## 2014-01-23 DIAGNOSIS — S5000XA Contusion of unspecified elbow, initial encounter: Secondary | ICD-10-CM | POA: Insufficient documentation

## 2014-01-23 DIAGNOSIS — F411 Generalized anxiety disorder: Secondary | ICD-10-CM | POA: Insufficient documentation

## 2014-01-23 DIAGNOSIS — W010XXA Fall on same level from slipping, tripping and stumbling without subsequent striking against object, initial encounter: Secondary | ICD-10-CM | POA: Insufficient documentation

## 2014-01-23 MED ORDER — HYDROCODONE-ACETAMINOPHEN 5-325 MG PO TABS
ORAL_TABLET | ORAL | Status: DC
Start: 2014-01-23 — End: 2014-01-27

## 2014-01-23 MED ORDER — CYCLOBENZAPRINE HCL 10 MG PO TABS
10.0000 mg | ORAL_TABLET | Freq: Once | ORAL | Status: AC
  Administered 2014-01-23: 10 mg via ORAL
  Filled 2014-01-23: qty 1

## 2014-01-23 MED ORDER — METHOCARBAMOL 500 MG PO TABS: 1000.0000 mg | ORAL_TABLET | Freq: Three times a day (TID) | ORAL | Status: DC

## 2014-01-23 MED ORDER — OXYCODONE-ACETAMINOPHEN 5-325 MG PO TABS
1.0000 | ORAL_TABLET | Freq: Once | ORAL | Status: AC
  Administered 2014-01-23: 1 via ORAL
  Filled 2014-01-23: qty 1

## 2014-01-23 NOTE — ED Notes (Addendum)
Pt c/o mid to lower back pain and right elbow pain. Pt was moving a couch with her sister and heard her right elbow pop. This pt has taken ibuprofen and aleve with no relief.

## 2014-01-23 NOTE — ED Notes (Signed)
Pt received discharge instructions and prescriptions, verbalized understanding and has no further questions. Pt ambulated to exit in stable condition with sling in place.  Advised to return to emergency department with new or worsening symptoms.  

## 2014-01-23 NOTE — Discharge Instructions (Signed)
Contusion A contusion is a deep bruise. Contusions happen when an injury causes bleeding under the skin. Signs of bruising include pain, puffiness (swelling), and discolored skin. The contusion may turn blue, purple, or yellow. HOME CARE   Put ice on the injured area.  Put ice in a plastic bag.  Place a towel between your skin and the bag.  Leave the ice on for 15-20 minutes, 03-04 times a day.  Only take medicine as told by your doctor.  Rest the injured area.  If possible, raise (elevate) the injured area to lessen puffiness. GET HELP RIGHT AWAY IF:   You have more bruising or puffiness.  You have pain that is getting worse.  Your puffiness or pain is not helped by medicine. MAKE SURE YOU:   Understand these instructions.  Will watch your condition.  Will get help right away if you are not doing well or get worse. Document Released: 02/27/2008 Document Revised: 12/03/2011 Document Reviewed: 07/16/2011 [facility]oralExitCare Patient Information 2014 [place] CenterExitCare, [place]LLC.  Back Pain, Adult Back pain is very common. The pain often gets better over time. The cause of back pain is usually not dangerous. Most people can learn to manage their back pain on their own.  HOME CARE   Stay active. Start with short walks on flat ground if you can. Try to walk farther each day.  Do not sit, drive, or stand in one place for more than 30 minutes. Do not stay in bed.  Do not avoid exercise or work. Activity can help your back heal faster.  Be careful when you bend or lift an object. Bend at your knees, keep the object close to you, and do not twist.  Sleep on a firm mattress. Lie on your side, and bend your knees. If you lie on your back, put a pillow under your knees.  Only take medicines as told by your doctor.  Put ice on the injured area.  Put ice in a plastic bag.  Place a towel between your skin and the bag.  Leave the ice on for 15-20 minutes, 03-04 times a day for the first 2 to 3 days.  After that, you can switch between ice and heat packs.  Ask your doctor about back exercises or massage.  Avoid feeling anxious or stressed. Find good ways to deal with stress, such as exercise. GET HELP RIGHT AWAY IF:   Your pain does not go away with rest or medicine.  Your pain does not go away in 1 week.  You have new problems.  You do not feel well.  The pain spreads into your legs.  You cannot control when you poop (bowel movement) or pee (urinate).  Your arms or legs feel weak or lose feeling (numbness).  You feel sick to your stomach (nauseous) or throw up (vomit).  You have belly (abdominal) pain.  You feel like you may pass out (faint). MAKE SURE YOU:   Understand these instructions.  Will watch your condition.  Will get help right away if you are not doing well or get worse. Document Released: 02/27/2008 Document Revised: 12/03/2011 Document Reviewed: 01/29/2011 [facility]Exit[facility] Patient Information 2014 [place]ExitCare, [place]LL[place].

## 2014-01-24 NOTE — ED Provider Notes (Signed)
CSN: [id number]     Arrival date & time 01/23/14  1824 History   First MD Initiated Contact with Patient 01/23/14 1849     Chief Complaint  Patient presents with  . Elbow Pain     (Consider location/radiation/quality/duration/timing/severity/associated sxs/prior Treatment) Patient is a 53 y.o. female presenting with arm injury. The history is provided by the patient.  Arm Injury Location:  Elbow Time since incident:  1 day Injury: yes   Mechanism of injury: fall   Mechanism of injury comment:  Patient was helping someone move a sofa and slipped and fell back, landing on her lower back and right elbow. Fall:    Fall occurred:  Standing   Impact surface:  Hard floor   Point of impact:  Back (right elbow)   Entrapped after fall: no   Elbow location:  R elbow Pain details:    Quality:  Aching and throbbing   Radiates to:  Does not radiate   Severity:  Moderate   Onset quality:  Sudden   Timing:  Constant   Progression:  Unchanged Chronicity:  New Handedness:  Right-handed Dislocation: no   Foreign body present:  No foreign bodies Prior injury to area:  No Relieved by:  Nothing Worsened by:  Movement Ineffective treatments:  NSAIDs and acetaminophen Associated symptoms: back pain   Associated symptoms: no decreased range of motion, no fever, no muscle weakness, no neck pain, no numbness, no stiffness, no swelling and no tingling   Associated symptoms comment:  She also c/o pain across her lower back.  She denies dysuria, numbness or weakness, incontinence of bladder or bowel, neck pain or head injury   Past Medical History  Diagnosis Date  . Hypertension   . High cholesterol   . Anxiety   . Diabetes mellitus without complication    Past Surgical History  Procedure Laterality Date  . Cholecystectomy    . Tonsillectomy    . Cesarean section    . Tubal ligation     History reviewed. No pertinent family history. History  Substance Use Topics  . Smoking status:  Current Every Day Smoker    Types: Cigarettes  . Smokeless tobacco: Not on file  . Alcohol Use: No   OB History   Grav Para Term Preterm Abortions TAB SAB Ect Mult Living                 Review of Systems  Constitutional: Negative for fever.  Respiratory: Negative for shortness of breath.   Gastrointestinal: Negative for vomiting, abdominal pain and constipation.  Genitourinary: Negative for dysuria, hematuria, flank pain, decreased urine volume and difficulty urinating.       No perineal numbness or incontinence of urine or feces  Musculoskeletal: Positive for arthralgias and back pain. Negative for joint swelling, neck pain and stiffness.       Right elbow pain  Skin: Negative for rash.  Neurological: Negative for dizziness, syncope, weakness, numbness and headaches.  All other systems reviewed and are negative.     Allergies  Cheese; Flexeril; Penicillins; Toradol; and Tramadol  Home Medications   Prior to Admission medications   Medication Sig Start Date End Date Taking? Authorizing Provider  clonazePAM (KLONOPIN) 0.5 MG tablet Take 0.5 mg by mouth 3 (three) times daily as needed for anxiety.   Yes Historical Provider, MD  ibuprofen (ADVIL,MOTRIN) 200 MG tablet Take 800 mg by mouth every 6 (six) hours as needed.   Yes Historical Provider, MD  lisinopril-hydrochlorothiazide (PRINZIDE,ZESTORETIC) 20-12.5  MG per tablet Take 1 tablet by mouth 2 (two) times daily.   Yes Historical Provider, MD  metFORMIN (GLUCOPHAGE) 500 MG tablet Take 500 mg by mouth 2 (two) times daily with a meal.   Yes Historical Provider, MD  metoprolol tartrate (LOPRESSOR) 25 MG tablet Take 25 mg by mouth 2 (two) times daily.   Yes Historical Provider, MD  HYDROcodone-acetaminophen (NORCO/VICODIN) 5-325 MG per tablet Take one-two tabs po q 4-6 hrs prn pain 01/23/14   Sagrario Lineberry L. Temisha Murley, PA-C  HYDROcodone-acetaminophen (NORCO/VICODIN) 5-325 MG per tablet Take one-two tabs po q 4-6 hrs prn pain 01/23/14   Shaneen Reeser L.  Divina Neale, PA-C  methocarbamol (ROBAXIN) 500 MG tablet Take 2 tablets (1,000 mg total) by mouth 3 (three) times daily. 01/23/14   Lebert Lovern L. Merlin Ege, PA-C   BP 156/95  Pulse 104  Temp(Src) 98.1 F (36.7 C) (Oral)  Resp 20  SpO2 99%  LMP 01/18/2014 Physical Exam  Nursing note and vitals reviewed. Constitutional: She is oriented to person, place, and time. She appears well-developed and well-nourished. No distress.  HENT:  Head: Normocephalic and atraumatic.  Neck: Normal range of motion. Neck supple.  Cardiovascular: Normal rate, regular rhythm, normal heart sounds and intact distal pulses.   No murmur heard. Pulmonary/Chest: Effort normal and breath sounds normal. No respiratory distress.  Abdominal: Soft. She exhibits no distension. There is no tenderness.  Musculoskeletal: She exhibits tenderness. She exhibits no edema.       Right elbow: She exhibits normal range of motion, no swelling, no effusion and no deformity. Tenderness found. Olecranon process tenderness noted.       Lumbar back: She exhibits tenderness and pain. She exhibits normal range of motion, no bony tenderness, no swelling, no deformity, no laceration and normal pulse.  ttp of the lumbar paraspinal muscles.  No spinal tenderness.  DP pulses are brisk and symmetrical.  Distal sensation intact.  Hip Flexors/Extensors are intact.  Pt has normal strength against resistance of bilateral lower extremities.   Pt has ttp of the olecranon process and lateral epicondyle of the right elbow.  Pt has full ROM of the elbow with mild to moderate crepitus throughout ROM.  Distal sensation intact, grip strength is strong and symmetrical.  No erythema or edema of the bursa  Neurological: She is alert and oriented to person, place, and time. She has normal strength. No sensory deficit. She exhibits normal muscle tone. Coordination and gait normal.  Reflex Scores:      Patellar reflexes are 2+ on the right side and 2+ on the left side.       Achilles reflexes are 2+ on the right side and 2+ on the left side. Skin: Skin is warm and dry. No rash noted.    ED Course  Procedures (including critical care time) Labs Review Labs Reviewed - No data to display  Imaging Review Dg Lumbar Spine Complete  01/23/2014   CLINICAL DATA:  Mid to lower back pain after moving a couch.  EXAM: LUMBAR SPINE - COMPLETE 4+ VIEW  COMPARISON:  11/27/2013.  FINDINGS: Five non-rib-bearing lumbar vertebrae. Anterior and lateral spur formation at multiple levels with disc space narrowing and vacuum phenomena. No fractures, pars defects or subluxations. Mild atheromatous arterial calcifications. Cholecystectomy clips.  IMPRESSION: 1. No fracture or subluxation. 2. Multilevel degenerative changes.   Electronically Signed   By: Gordan PaymentSteve  Reid M.D.   On: 01/23/2014 19:35   Dg Elbow Complete Right  01/23/2014   CLINICAL DATA:  Right elbow  pain after moving a couch and hearing a popping sound.  EXAM: RIGHT ELBOW - COMPLETE 3+ VIEW  COMPARISON:  None.  FINDINGS: Moderate fragmented anterior spur formation with corticated margins involving the radial head and distal humerus. There is also spur formation at the articulation of the radial head and proximal ulna. There is also radial subluxation of the radial head relative to the capitellum. No fracture or effusion seen.  IMPRESSION: Chronic changes, as described above, most likely due to old trauma. No acute fracture seen.   Electronically Signed   By: Gordan PaymentSteve  Reid M.D.   On: 01/23/2014 19:39     EKG Interpretation None      MDM   Final diagnoses:  Contusion of elbow, right  Low back pain    Pt is well appearing, non-toxic.  NV intact.  Ambulates with steady gait.  No concerning sx's for septic joint.  Sling given for comfort and XR results discussed with pt.  She denies previous injury to the elbow.  Referral info given for ortho.  Pt agrees to symptomatic tx and close f/u.  She appears stable for d/c and agrees to plan  including vicodin and robaxin.      Renly Roots L. Ima Hafner, PA-C 01/24/14 2359

## 2014-01-25 MED FILL — Hydrocodone-Acetaminophen Tab 5-325 MG: ORAL | Qty: 6 | Status: AC

## 2014-01-25 NOTE — ED Provider Notes (Signed)
Medical screening examination/treatment/procedure(s) were performed by non-physician practitioner and as supervising physician I was immediately available for consultation/collaboration.   EKG Interpretation None        Laray AngerKathleen M Dearion Huot, DO 01/25/14 1351

## 2014-01-27 ENCOUNTER — Encounter (HOSPITAL_COMMUNITY): Payer: Self-pay | Admitting: Emergency Medicine

## 2014-01-27 ENCOUNTER — Emergency Department (HOSPITAL_COMMUNITY)
Admission: EM | Admit: 2014-01-27 | Discharge: 2014-01-27 | Disposition: A | Discharge status: Home / Self Care | Payer: Self-pay | Attending: Emergency Medicine | Admitting: Emergency Medicine

## 2014-01-27 DIAGNOSIS — F411 Generalized anxiety disorder: Secondary | ICD-10-CM | POA: Insufficient documentation

## 2014-01-27 DIAGNOSIS — R296 Repeated falls: Secondary | ICD-10-CM | POA: Insufficient documentation

## 2014-01-27 DIAGNOSIS — M549 Dorsalgia, unspecified: Secondary | ICD-10-CM

## 2014-01-27 DIAGNOSIS — Z880 Allergy status to penicillin: Secondary | ICD-10-CM | POA: Insufficient documentation

## 2014-01-27 DIAGNOSIS — E119 Type 2 diabetes mellitus without complications: Secondary | ICD-10-CM | POA: Insufficient documentation

## 2014-01-27 DIAGNOSIS — I10 Essential (primary) hypertension: Secondary | ICD-10-CM | POA: Insufficient documentation

## 2014-01-27 DIAGNOSIS — Y939 Activity, unspecified: Secondary | ICD-10-CM | POA: Insufficient documentation

## 2014-01-27 DIAGNOSIS — F17200 Nicotine dependence, unspecified, uncomplicated: Secondary | ICD-10-CM | POA: Insufficient documentation

## 2014-01-27 DIAGNOSIS — Y929 Unspecified place or not applicable: Secondary | ICD-10-CM | POA: Insufficient documentation

## 2014-01-27 DIAGNOSIS — Z79899 Other long term (current) drug therapy: Secondary | ICD-10-CM | POA: Insufficient documentation

## 2014-01-27 DIAGNOSIS — IMO0002 Reserved for concepts with insufficient information to code with codable children: Secondary | ICD-10-CM | POA: Insufficient documentation

## 2014-01-27 MED ORDER — HYDROCODONE-ACETAMINOPHEN 5-325 MG PO TABS: 1.0000 | ORAL_TABLET | Freq: Four times a day (QID) | ORAL | Status: DC | PRN

## 2014-01-27 NOTE — ED Notes (Signed)
Pt c/o back pain that began one week ago after a fall. Pt was seen here last week and given pain medications for home. Pt states she is out of her meds and the pain has not improved.

## 2014-01-27 NOTE — ED Provider Notes (Signed)
Medical screening examination/treatment/procedure(s) were performed by non-physician practitioner and as supervising physician I was immediately available for consultation/collaboration.   EKG Interpretation None       Jamisyn Langer, MD 01/27/14 1455 

## 2014-01-27 NOTE — Discharge Instructions (Signed)
Back Pain, Adult Low back pain is very common. About 1 in 5 people have back pain.The cause of low back pain is rarely dangerous. The pain often gets better over time.About half of people with a sudden onset of back pain feel better in just 2 weeks. About 8 in 10 people feel better by 6 weeks.  CAUSES Some common causes of back pain include:  Strain of the muscles or ligaments supporting the spine.  Wear and tear (degeneration) of the spinal discs.  Arthritis.  Direct injury to the back. DIAGNOSIS Most of the time, the direct cause of low back pain is not known.However, back pain can be treated effectively even when the exact cause of the pain is unknown.Answering your caregiver's questions about your overall health and symptoms is one of the most accurate ways to make sure the cause of your pain is not dangerous. If your caregiver needs more information, he or she may order lab work or imaging tests (X-rays or MRIs).However, even if imaging tests show changes in your back, this usually does not require surgery. HOME CARE INSTRUCTIONS For many people, back pain returns.Since low back pain is rarely dangerous, it is often a condition that people can learn to manageon their own.   Remain active. It is stressful on the back to sit or stand in one place. Do not sit, drive, or stand in one place for more than 30 minutes at a time. Take short walks on level surfaces as soon as pain allows.Try to increase the length of time you walk each day.  Do not stay in bed.Resting more than 1 or 2 days can delay your recovery.  Do not avoid exercise or work.Your body is made to move.It is not dangerous to be active, even though your back may hurt.Your back will likely heal faster if you return to being active before your pain is gone.  Pay attention to your body when you bend and lift. Many people have less discomfortwhen lifting if they bend their knees, keep the load close to their bodies,and  avoid twisting. Often, the most comfortable positions are those that put less stress on your recovering back.  Find a comfortable position to sleep. Use a firm mattress and lie on your side with your knees slightly bent. If you lie on your back, put a pillow under your knees.  Only take over-the-counter or prescription medicines as directed by your caregiver. Over-the-counter medicines to reduce pain and inflammation are often the most helpful.Your caregiver may prescribe muscle relaxant drugs.These medicines help dull your pain so you can more quickly return to your normal activities and healthy exercise.  Put ice on the injured area.  Put ice in a plastic bag.  Place a towel between your skin and the bag.  Leave the ice on for 15-20 minutes, 03-04 times a day for the first 2 to 3 days. After that, ice and heat may be alternated to reduce pain and spasms.  Ask your caregiver about trying back exercises and gentle massage. This may be of some benefit.  Avoid feeling anxious or stressed.Stress increases muscle tension and can worsen back pain.It is important to recognize when you are anxious or stressed and learn ways to manage it.Exercise is a great option. SEEK MEDICAL CARE IF:  You have pain that is not relieved with rest or medicine.  You have pain that does not improve in 1 week.  You have new symptoms.  You are generally not feeling well. SEEK   IMMEDIATE MEDICAL CARE IF:   You have pain that radiates from your back into your legs.  You develop new bowel or bladder control problems.  You have unusual weakness or numbness in your arms or legs.  You develop nausea or vomiting.  You develop abdominal pain.  You feel faint. Document Released: 09/10/2005 Document Revised: 03/11/2012 Document Reviewed: 01/29/2011 [employer] Patient Information 2014 [employer].  

## 2014-01-27 NOTE — ED Provider Notes (Signed)
CSN: [id number]     Arrival date & time 01/27/14  1330 History   First MD Initiated Contact with Patient 01/27/14 1339     Chief Complaint  Patient presents with  . Back Pain     (Consider location/radiation/quality/duration/timing/severity/associated sxs/prior Treatment) HPI Comments: Pt c/o lower back and elbow pain that started after a fall a week ago. Pt denies any numbness, weakness or incontinence. Pt states that seh was given something for pain and she is out and she is continuing to have pain. She states that she doesn't have a pcp so she came in here today.   The history is provided by the patient. No language interpreter was used.    Past Medical History  Diagnosis Date  . Hypertension   . High cholesterol   . Anxiety   . Diabetes mellitus without complication    Past Surgical History  Procedure Laterality Date  . Cholecystectomy    . Tonsillectomy    . Cesarean section    . Tubal ligation     History reviewed. No pertinent family history. History  Substance Use Topics  . Smoking status: Current Every Day Smoker    Types: Cigarettes  . Smokeless tobacco: Not on file  . Alcohol Use: No   OB History   Grav Para Term Preterm Abortions TAB SAB Ect Mult Living                 Review of Systems  Constitutional: Negative.   Respiratory: Negative.   Cardiovascular: Negative.       Allergies  Cheese; Flexeril; Penicillins; Toradol; and Tramadol  Home Medications   Prior to Admission medications   Medication Sig Start Date End Date Taking? Authorizing Provider  clonazePAM (KLONOPIN) 0.5 MG tablet Take 0.5 mg by mouth 3 (three) times daily as needed for anxiety.   Yes Historical Provider, MD  ibuprofen (ADVIL,MOTRIN) 200 MG tablet Take 800 mg by mouth every 6 (six) hours as needed for moderate pain.    Yes Historical Provider, MD  lisinopril-hydrochlorothiazide (PRINZIDE,ZESTORETIC) 20-12.5 MG per tablet Take 1 tablet by mouth 2 (two) times daily.   Yes  Historical Provider, MD  metFORMIN (GLUCOPHAGE) 500 MG tablet Take 500 mg by mouth 2 (two) times daily with a meal.   Yes Historical Provider, MD  methocarbamol (ROBAXIN) 500 MG tablet Take 2 tablets (1,000 mg total) by mouth 3 (three) times daily. 01/23/14  Yes Tammy L. Triplett, PA-C  metoprolol tartrate (LOPRESSOR) 25 MG tablet Take 25 mg by mouth 2 (two) times daily.   Yes Historical Provider, MD  HYDROcodone-acetaminophen (NORCO/VICODIN) 5-325 MG per tablet Take 1-2 tablets by mouth every 6 (six) hours as needed. 01/27/14   Teressa LowerVrinda Alorah Mcree, NP   BP 179/105  Pulse 105  Temp(Src) 98.3 F (36.8 C) (Oral)  Resp 18  Ht 6' (1.829 m)  Wt 182 lb (82.555 kg)  BMI 24.68 kg/m2  SpO2 95%  LMP 01/18/2014 Physical Exam  Nursing note and vitals reviewed. Constitutional: She is oriented to person, place, and time. She appears well-developed and well-nourished.  Cardiovascular: Normal rate and regular rhythm.   Pulmonary/Chest: Effort normal.  Musculoskeletal:  Lumbar spine tenderness  Neurological: She is alert and oriented to person, place, and time. Coordination normal.  Moves all extremities without any problem. Strength is equall  Skin: Skin is warm and dry.  Psychiatric: She has a normal mood and affect.    ED Course  Procedures (including critical care time) Labs Review Labs Reviewed -  No data to display  Imaging Review No results found.   EKG Interpretation None      MDM   Final diagnoses:  Back pain    Will treat with a couple more hydrocodone. Discussed with pt that she needs to follow up and we will not give her more pain medication for this     Teressa LowerVrinda Dashiel Bergquist, NP 01/27/14 1357

## 2014-02-09 ENCOUNTER — Emergency Department (HOSPITAL_COMMUNITY)
Admission: EM | Admit: 2014-02-09 | Discharge: 2014-02-09 | Disposition: A | Discharge status: Home / Self Care | Payer: Self-pay | Attending: Emergency Medicine | Admitting: Emergency Medicine

## 2014-02-09 ENCOUNTER — Encounter (HOSPITAL_COMMUNITY): Payer: Self-pay | Admitting: Emergency Medicine

## 2014-02-09 ENCOUNTER — Emergency Department (HOSPITAL_COMMUNITY): Payer: Self-pay

## 2014-02-09 DIAGNOSIS — I10 Essential (primary) hypertension: Secondary | ICD-10-CM | POA: Insufficient documentation

## 2014-02-09 DIAGNOSIS — Z79899 Other long term (current) drug therapy: Secondary | ICD-10-CM | POA: Insufficient documentation

## 2014-02-09 DIAGNOSIS — S59919A Unspecified injury of unspecified forearm, initial encounter: Secondary | ICD-10-CM

## 2014-02-09 DIAGNOSIS — IMO0002 Reserved for concepts with insufficient information to code with codable children: Secondary | ICD-10-CM | POA: Insufficient documentation

## 2014-02-09 DIAGNOSIS — W19XXXA Unspecified fall, initial encounter: Secondary | ICD-10-CM

## 2014-02-09 DIAGNOSIS — F17200 Nicotine dependence, unspecified, uncomplicated: Secondary | ICD-10-CM | POA: Insufficient documentation

## 2014-02-09 DIAGNOSIS — S59901A Unspecified injury of right elbow, initial encounter: Secondary | ICD-10-CM

## 2014-02-09 DIAGNOSIS — E119 Type 2 diabetes mellitus without complications: Secondary | ICD-10-CM | POA: Insufficient documentation

## 2014-02-09 DIAGNOSIS — F411 Generalized anxiety disorder: Secondary | ICD-10-CM | POA: Insufficient documentation

## 2014-02-09 DIAGNOSIS — Y9301 Activity, walking, marching and hiking: Secondary | ICD-10-CM | POA: Insufficient documentation

## 2014-02-09 DIAGNOSIS — S3992XA Unspecified injury of lower back, initial encounter: Secondary | ICD-10-CM

## 2014-02-09 DIAGNOSIS — S6990XA Unspecified injury of unspecified wrist, hand and finger(s), initial encounter: Secondary | ICD-10-CM

## 2014-02-09 DIAGNOSIS — S59909A Unspecified injury of unspecified elbow, initial encounter: Secondary | ICD-10-CM | POA: Insufficient documentation

## 2014-02-09 DIAGNOSIS — Y9289 Other specified places as the place of occurrence of the external cause: Secondary | ICD-10-CM | POA: Insufficient documentation

## 2014-02-09 DIAGNOSIS — Z880 Allergy status to penicillin: Secondary | ICD-10-CM | POA: Insufficient documentation

## 2014-02-09 DIAGNOSIS — W108XXA Fall (on) (from) other stairs and steps, initial encounter: Secondary | ICD-10-CM | POA: Insufficient documentation

## 2014-02-09 MED ORDER — METHOCARBAMOL 500 MG PO TABS: 500.0000 mg | ORAL_TABLET | Freq: Two times a day (BID) | ORAL | Status: DC | PRN

## 2014-02-09 MED ORDER — METHOCARBAMOL 500 MG PO TABS
500.0000 mg | ORAL_TABLET | Freq: Once | ORAL | Status: AC
  Administered 2014-02-09: 500 mg via ORAL
  Filled 2014-02-09: qty 1

## 2014-02-09 MED ORDER — ACETAMINOPHEN 500 MG PO TABS: 500.0000 mg | ORAL_TABLET | Freq: Four times a day (QID) | ORAL | Status: DC | PRN

## 2014-02-09 NOTE — ED Notes (Signed)
Onset Saturday morning pt was helping sister move furniture, pt was walking backwards up steps fell on left knee and right elbow then hit mid back on steps.  Pain from mid back down to tailbone.  Pt has been taking Ibuprofen, last dose 800 mg @ 8am today, Aleve x 2 @ 10:30am.  Pain getting worse.  Pain to lateral side of right hip to right knee when laying on right side only.  No bowel/bladder problems.

## 2014-02-09 NOTE — ED Notes (Signed)
Also, c/o right elbow pain.

## 2014-02-09 NOTE — ED Provider Notes (Signed)
CSN: [id number]     Arrival date & time 02/09/14  1224 History  This chart was scribed for non-physician practitioner, Emilia BeckKaitlyn Amarrion Pastorino, PA-C working with [name], MD by [name], ED scribe. This patient was seen in room TR10C/TR10C and the patient's care was started at 2:47 PM.   Chief Complaint  Patient presents with  . Fall  . Back Pain   The history is provided by the patient. No language interpreter was used.   HPI Comments: Melissa Huynh is a 53 y.o. female who presents to the Emergency Department complaining of a fall that occurred 3 days ago. Pt was helping her sister move furniture and fell while walking backwards up the steps. She landed on her left knee, right elbow and lower back having sudden onset, worsening pain. States the back pain intermittently radiates into her right buttock and leg. Pt has taken 800 mg ibuprofen and aleve with no relief. Takes the ibuprofen every 4-6 hours and aleve every 6 hours. Certain movements worsen the pain.   Past Medical History  Diagnosis Date  . Hypertension   . High cholesterol   . Anxiety   . Diabetes mellitus without complication    Past Surgical History  Procedure Laterality Date  . Cholecystectomy    . Tonsillectomy    . Cesarean section    . Tubal ligation     No family history on file. History  Substance Use Topics  . Smoking status: Current Every Day Smoker -- 0.50 packs/day    Types: Cigarettes  . Smokeless tobacco: Not on file  . Alcohol Use: No   OB History   Grav Para Term Preterm Abortions TAB SAB Ect Mult Living                 Review of Systems  Musculoskeletal: Positive for arthralgias and back pain.  All other systems reviewed and are negative.  Allergies  Cheese; Flexeril; Penicillins; Toradol; and Tramadol  Home Medications   Prior to Admission medications   Medication Sig Start Date End Date Taking? Authorizing Provider  clonazePAM (KLONOPIN) 0.5 MG tablet Take 0.5 mg by mouth 3  (three) times daily as needed for anxiety.    Historical Provider, MD  HYDROcodone-acetaminophen (NORCO/VICODIN) 5-325 MG per tablet Take 1-2 tablets by mouth every 6 (six) hours as needed. 01/27/14   Teressa LowerVrinda Pickering, NP  ibuprofen (ADVIL,MOTRIN) 200 MG tablet Take 800 mg by mouth every 6 (six) hours as needed for moderate pain.     Historical Provider, MD  lisinopril-hydrochlorothiazide (PRINZIDE,ZESTORETIC) 20-12.5 MG per tablet Take 1 tablet by mouth 2 (two) times daily.    Historical Provider, MD  metFORMIN (GLUCOPHAGE) 500 MG tablet Take 500 mg by mouth 2 (two) times daily with a meal.    Historical Provider, MD  methocarbamol (ROBAXIN) 500 MG tablet Take 2 tablets (1,000 mg total) by mouth 3 (three) times daily. 01/23/14   Tammy L. Triplett, PA-C  metoprolol tartrate (LOPRESSOR) 25 MG tablet Take 25 mg by mouth 2 (two) times daily.    Historical Provider, MD   BP 121/77  Pulse 89  Temp(Src) 97.6 F (36.4 C) (Oral)  Resp 16  SpO2 97%  LMP 01/18/2014  Physical Exam  Nursing note and vitals reviewed. Constitutional: She is oriented to person, place, and time. She appears well-developed and well-nourished. No distress.  HENT:  Head: Normocephalic and atraumatic.  Eyes: EOM are normal.  Neck: Neck supple. No tracheal deviation present.  Cardiovascular: Normal rate.  Pulmonary/Chest: Effort normal. No respiratory distress.  Musculoskeletal: Normal range of motion.  T10-L3 tenderness to palpation with associated paraspinal tenderness. Right elbow generalized tenderness to palpation without obvious deformity. Full ROM.   Neurological: She is alert and oriented to person, place, and time.  Skin: Skin is warm and dry.  Psychiatric: She has a normal mood and affect. Her behavior is normal.    ED Course  Procedures (including critical care time)  DIAGNOSTIC STUDIES: Oxygen Saturation is 97% on RA, normal by my interpretation.    COORDINATION OF CARE: 2:51 PM-Discussed treatment plan  which includes tylenol, a muscle relaxer and a heating pad with pt at bedside and pt agreed to plan.   Labs Review Labs Reviewed - No data to display  Imaging Review Dg Thoracic Spine 2 View  02/09/2014   CLINICAL DATA:  Back pain status post fall  EXAM: THORACIC SPINE - 2 VIEW  COMPARISON:  11/17/2013  FINDINGS: There is no evidence of thoracic spine fracture. Alignment is normal. There is mild thoracic spine spondylosis. No other significant bone abnormalities are identified.  IMPRESSION: No acute osseous injury of the thoracic spine.   Electronically Signed   By: Elige KoHetal  Patel   On: 02/09/2014 14:32   Dg Lumbar Spine 2-3 Views  02/09/2014   CLINICAL DATA:  Back pain status post fall  EXAM: LUMBAR SPINE - 2-3 VIEW  COMPARISON:  None.  FINDINGS: There are 5 nonrib bearing lumbar-type vertebral bodies. The vertebral body heights are maintained. The alignment is anatomic. There is no spondylolysis. There is no acute fracture or static listhesis. There is mild degenerative disc disease throughout the lumbar spine.  The SI joints are unremarkable.  IMPRESSION: No acute osseous injury of the lumbar spine.   Electronically Signed   By: Elige KoHetal  Patel   On: 02/09/2014 14:33   Dg Elbow 2 Views Right  02/09/2014   CLINICAL DATA:  Right elbow pain  EXAM: RIGHT ELBOW - 2 VIEW  COMPARISON:  01/23/2014  FINDINGS: There is no acute fracture or dislocation. There is osteoarthritis of the radiocapitellar joint with an osteochondral lesion involving the radial head. There are mild degenerative changes of the ulnohumeral joint. There is no joint effusion.  IMPRESSION: 1. No acute osseous injury of the right elbow. 2. 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   Electronically Signed   By: Elige KoHetal  Patel   On: 02/09/2014 14:38     EKG Interpretation None      MDM   Final diagnoses:  Fall  Back injury  Injury of right elbow    3:22 PM Patient has no acute fracture.  Patient given robaxin for pain. Patient was able to ambulate in the ED and out of the ED without difficulty. She is unhappy with my prescriptions. Vitals stable and patient afebrile.   I personally performed the services described in this documentation, which was scribed in my presence. The recorded information has been reviewed and is accurate.  Emilia BeckKaitlyn Saina Waage, PA-C 02/09/14 1523

## 2014-02-09 NOTE — Discharge Instructions (Signed)
Take tylenol and robaxin as needed for your pain. Apply heat to the affected area. Refer to attached documents for more information.

## 2014-02-13 NOTE — ED Provider Notes (Signed)
Medical screening examination/treatment/procedure(s) were performed by non-physician practitioner and as supervising physician I was immediately available for consultation/collaboration.   EKG Interpretation None       Caramia Boutin, MD 02/13/14 1339 

## 2014-05-02 ENCOUNTER — Encounter (HOSPITAL_COMMUNITY): Payer: Self-pay | Admitting: Emergency Medicine

## 2014-05-02 ENCOUNTER — Emergency Department (HOSPITAL_COMMUNITY)
Admission: EM | Admit: 2014-05-02 | Discharge: 2014-05-03 | Disposition: A | Discharge status: Home / Self Care | Payer: Self-pay | Attending: Emergency Medicine | Admitting: Emergency Medicine

## 2014-05-02 DIAGNOSIS — I10 Essential (primary) hypertension: Secondary | ICD-10-CM | POA: Insufficient documentation

## 2014-05-02 DIAGNOSIS — Z880 Allergy status to penicillin: Secondary | ICD-10-CM | POA: Insufficient documentation

## 2014-05-02 DIAGNOSIS — G8929 Other chronic pain: Secondary | ICD-10-CM | POA: Insufficient documentation

## 2014-05-02 DIAGNOSIS — M5432 Sciatica, left side: Secondary | ICD-10-CM

## 2014-05-02 DIAGNOSIS — M545 Low back pain, unspecified: Secondary | ICD-10-CM | POA: Insufficient documentation

## 2014-05-02 DIAGNOSIS — Z79899 Other long term (current) drug therapy: Secondary | ICD-10-CM | POA: Insufficient documentation

## 2014-05-02 DIAGNOSIS — E119 Type 2 diabetes mellitus without complications: Secondary | ICD-10-CM | POA: Insufficient documentation

## 2014-05-02 DIAGNOSIS — M5430 Sciatica, unspecified side: Secondary | ICD-10-CM | POA: Insufficient documentation

## 2014-05-02 DIAGNOSIS — Z791 Long term (current) use of non-steroidal anti-inflammatories (NSAID): Secondary | ICD-10-CM | POA: Insufficient documentation

## 2014-05-02 DIAGNOSIS — F411 Generalized anxiety disorder: Secondary | ICD-10-CM | POA: Insufficient documentation

## 2014-05-02 DIAGNOSIS — F17200 Nicotine dependence, unspecified, uncomplicated: Secondary | ICD-10-CM | POA: Insufficient documentation

## 2014-05-02 NOTE — ED Notes (Signed)
Pt with chronic lower back problems, states she was helping to move furniture yesterday and feels she strained it.

## 2014-05-02 NOTE — ED Provider Notes (Signed)
CSN: [id number]     Arrival date & time 05/02/14  2335 History   First MD Initiated Contact with Patient 05/02/14 2346     Chief Complaint  Patient presents with  . Back Pain     (Consider location/radiation/quality/duration/timing/severity/associated sxs/prior Treatment)  Katrinka BlazingCheryl Choung is a 53 y.o. female who presents to the Emergency Department complaining of worsening of her chronic low back pain.  Pain became worse yesterday while helping someone move furniture.  She states she felt a sharp pain in her left lower back that radiates to her left knee.  She also reports hx of chronic left knee pain and states that she has an appt with her PMD on Friday and states that OTC medications usually help her pain , but have not controlled her current pain.     Patient is a 53 y.o. female presenting with back pain. The history is provided by the patient.  Back Pain Location:  Lumbar spine Quality:  Aching and shooting Radiates to:  L posterior upper leg, L knee and L thigh Pain severity:  Severe Pain is:  Same all the time Onset quality:  Gradual Duration:  1 day Timing:  Constant Progression:  Worsening Chronicity:  Recurrent Context: lifting heavy objects, recent injury and twisting   Context: not falling   Relieved by:  Nothing Worsened by:  Bending, twisting and standing Ineffective treatments:  NSAIDs and OTC medications Associated symptoms: leg pain   Associated symptoms: no abdominal pain, no abdominal swelling, no bladder incontinence, no bowel incontinence, no chest pain, no dysuria, no fever, no headaches, no numbness, no paresthesias, no pelvic pain, no perianal numbness, no tingling and no weakness     Past Medical History  Diagnosis Date  . Hypertension   . High cholesterol   . Anxiety   . Diabetes mellitus without complication    Past Surgical History  Procedure Laterality Date  . Cholecystectomy    . Tonsillectomy    . Cesarean section    . Tubal ligation     No  family history on file. History  Substance Use Topics  . Smoking status: Current Every Day Smoker -- 0.50 packs/day    Types: Cigarettes  . Smokeless tobacco: Not on file  . Alcohol Use: No   OB History   Grav Para Term Preterm Abortions TAB SAB Ect Mult Living                 Review of Systems  Constitutional: Negative for fever.  Respiratory: Negative for shortness of breath.   Cardiovascular: Negative for chest pain.  Gastrointestinal: Negative for vomiting, abdominal pain, constipation and bowel incontinence.  Genitourinary: Negative for bladder incontinence, dysuria, hematuria, flank pain, decreased urine volume, difficulty urinating and pelvic pain.       No perineal numbness or incontinence of urine or feces  Musculoskeletal: Positive for back pain. Negative for joint swelling.  Skin: Negative for rash.  Neurological: Negative for tingling, weakness, numbness, headaches and paresthesias.  All other systems reviewed and are negative.     Allergies  Cheese; Flexeril; Penicillins; Toradol; and Tramadol  Home Medications   Prior to Admission medications   Medication Sig Start Date End Date Taking? Authorizing Provider  acetaminophen (TYLENOL) 500 MG tablet Take 1 tablet (500 mg total) by mouth every 6 (six) hours as needed. 02/09/14  Yes Kaitlyn Szekalski, PA-C  clonazePAM (KLONOPIN) 0.5 MG tablet Take 0.5 mg by mouth 3 (three) times daily as needed for anxiety.   Yes  Historical Provider, MD  ibuprofen (ADVIL,MOTRIN) 200 MG tablet Take 800 mg by mouth every 6 (six) hours as needed for moderate pain.    Yes Historical Provider, MD  lisinopril-hydrochlorothiazide (PRINZIDE,ZESTORETIC) 20-12.5 MG per tablet Take 1 tablet by mouth 2 (two) times daily.   Yes Historical Provider, MD  metFORMIN (GLUCOPHAGE) 500 MG tablet Take 500 mg by mouth 2 (two) times daily with a meal.   Yes Historical Provider, MD  metoprolol tartrate (LOPRESSOR) 25 MG tablet Take 25 mg by mouth 2 (two) times  daily.   Yes Historical Provider, MD  HYDROcodone-acetaminophen (NORCO/VICODIN) 5-325 MG per tablet Take 1-2 tablets by mouth every 6 (six) hours as needed. 01/27/14   Teressa Lower, NP  methocarbamol (ROBAXIN) 500 MG tablet Take 2 tablets (1,000 mg total) by mouth 3 (three) times daily. 01/23/14   Yovani Cogburn L. Nikki Rusnak, PA-C  methocarbamol (ROBAXIN) 500 MG tablet Take 1 tablet (500 mg total) by mouth 2 (two) times daily as needed for muscle spasms. 02/09/14   Kaitlyn Szekalski, PA-C   BP 140/106  Pulse 90  Temp(Src) 98.1 F (36.7 C) (Oral)  Resp 20  Ht 5\' 9"  (1.753 m)  Wt 191 lb (86.637 kg)  BMI 28.19 kg/m2  SpO2 100% Physical Exam  Nursing note and vitals reviewed. Constitutional: She is oriented to person, place, and time. She appears well-developed and well-nourished. No distress.  HENT:  Head: Normocephalic and atraumatic.  Neck: Normal range of motion. Neck supple.  Cardiovascular: Normal rate, regular rhythm, normal heart sounds and intact distal pulses.   No murmur heard. Pulmonary/Chest: Effort normal and breath sounds normal. No respiratory distress. She exhibits no tenderness.  Abdominal: Soft. She exhibits no distension. There is no tenderness.  Musculoskeletal: She exhibits tenderness. She exhibits no edema.       Lumbar back: She exhibits tenderness and pain. She exhibits normal range of motion, no swelling, no deformity, no laceration and normal pulse.  ttp of the left lumbar paraspinal muscles and SI joint.  No spinal tenderness.  DP pulses are brisk and symmetrical.  Distal sensation intact.  Hip Flexors/Extensors are intact.  Pt has 5/5 strength against resistance of bilateral lower extremities.     Neurological: She is alert and oriented to person, place, and time. She has normal strength. No sensory deficit. She exhibits normal muscle tone. Coordination and gait normal.  Reflex Scores:      Patellar reflexes are 2+ on the right side and 2+ on the left side.      Achilles  reflexes are 2+ on the right side and 2+ on the left side. Skin: Skin is warm and dry. No rash noted.    ED Course  Procedures (including critical care time) Labs Review Labs Reviewed - No data to display  Imaging Review No results found.   EKG Interpretation None      MDM   Final diagnoses:  Sciatica, left    Pt was [place] the [place] and [place].  No narcotics filed since February.  Patient is feeling better.  Ambulates with steady gait.  Vitals improved.  No concerning sx's for emergent neurological or infectious process.  Pain likely related to sciatica.  Pt agrees to close f/u with her PMD on Friday of this week.  She appears stable for d/c.    Saamir Armstrong L. Trisha Mangle, PA-C 05/03/14 [phone]

## 2014-05-03 MED ORDER — METHOCARBAMOL 500 MG PO TABS: 500.0000 mg | ORAL_TABLET | Freq: Three times a day (TID) | ORAL | Status: DC

## 2014-05-03 MED ORDER — HYDROMORPHONE HCL PF 1 MG/ML IJ SOLN
1.0000 mg | Freq: Once | INTRAMUSCULAR | Status: AC
  Administered 2014-05-03: 1 mg via INTRAMUSCULAR
  Filled 2014-05-03: qty 1

## 2014-05-03 MED ORDER — OXYCODONE-ACETAMINOPHEN 5-325 MG PO TABS: 1.0000 | ORAL_TABLET | ORAL | Status: DC | PRN

## 2014-05-03 MED ORDER — ONDANSETRON 8 MG PO TBDP
8.0000 mg | ORAL_TABLET | Freq: Once | ORAL | Status: AC
  Administered 2014-05-03: 8 mg via ORAL
  Filled 2014-05-03: qty 1

## 2014-05-03 MED ORDER — METHOCARBAMOL 500 MG PO TABS
500.0000 mg | ORAL_TABLET | Freq: Once | ORAL | Status: AC
  Administered 2014-05-03: 500 mg via ORAL
  Filled 2014-05-03: qty 1

## 2014-05-03 MED FILL — Oxycodone w/ Acetaminophen Tab 5-325 MG: ORAL | Qty: 6 | Status: AC

## 2014-05-03 NOTE — Discharge Instructions (Signed)
Back Pain, Adult °Back pain is very common. The pain often gets better over time. The cause of back pain is usually not dangerous. Most people can learn to manage their back pain on their own.  °HOME CARE  °· Stay active. Start with short walks on flat ground if you can. Try to walk farther each day. °· Do not sit, drive, or stand in one place for more than 30 minutes. Do not stay in bed. °· Do not avoid exercise or work. Activity can help your back heal faster. °· Be careful when you bend or lift an object. Bend at your knees, keep the object close to you, and do not twist. °· Sleep on a firm mattress. Lie on your side, and bend your knees. If you lie on your back, put a pillow under your knees. °· Only take medicines as told by your doctor. °· Put ice on the injured area. °¨ Put ice in a plastic bag. °¨ Place a towel between your skin and the bag. °¨ Leave the ice on for 15-20 minutes, 03-04 times a day for the first 2 to 3 days. After that, you can switch between ice and heat packs. °· Ask your doctor about back exercises or massage. °· Avoid feeling anxious or stressed. Find good ways to deal with stress, such as exercise. °GET HELP RIGHT AWAY IF:  °· Your pain does not go away with rest or medicine. °· Your pain does not go away in 1 week. °· You have new problems. °· You do not feel well. °· The pain spreads into your legs. °· You cannot control when you poop (bowel movement) or pee (urinate). °· Your arms or legs feel weak or lose feeling (numbness). °· You feel sick to your stomach (nauseous) or throw up (vomit). °· You have belly (abdominal) pain. °· You feel like you may pass out (faint). °MAKE SURE YOU:  °· Understand these instructions. °· Will watch your condition. °· Will get help right away if you are not doing well or get worse. °Document Released: 02/27/2008 Document Revised: 12/03/2011 Document Reviewed: 01/12/2014 °[employer]® Patient Information ©2015 [employer]. This information is not intended  to replace advice given to you by your health care provider. Make sure you discuss any questions you have with your health care provider. ° °Sciatica °Sciatica is pain, weakness, numbness, or tingling along your sciatic nerve. The nerve starts in the lower back and runs down the back of each leg. Nerve damage or certain conditions pinch or put pressure on the sciatic nerve. This causes the pain, weakness, and other discomforts of sciatica. °HOME CARE  °· Only take medicine as told by your doctor. °· Apply ice to the affected area for 20 minutes. Do this 3-4 times a day for the first 48-72 hours. Then try heat in the same way. °· Exercise, stretch, or do your usual activities if these do not make your pain worse. °· Go to physical therapy as told by your doctor. °· Keep all doctor visits as told. °· Do not wear high heels or shoes that are not supportive. °· Get a firm mattress if your mattress is too soft to lessen pain and discomfort. °GET HELP RIGHT AWAY IF:  °· You cannot control when you poop (bowel movement) or pee (urinate). °· You have more weakness in your lower back, lower belly (pelvis), butt (buttocks), or legs. °· You have redness or puffiness (swelling) of your back. °· You have a burning feeling   when you pee. °· You have pain that gets worse when you lie down. °· You have pain that wakes you from your sleep. °· Your pain is worse than past pain. °· Your pain lasts longer than 4 weeks. °· You are suddenly losing weight without reason. °MAKE SURE YOU:  °· Understand these instructions. °· Will watch this condition. °· Will get help right away if you are not doing well or get worse. °Document Released: 06/19/2008 Document Revised: 03/11/2012 Document Reviewed: 01/20/2012 °[employer]® Patient Information ©2015 [employer]. This information is not intended to replace advice given to you by your health care provider. Make sure you discuss any questions you have with your health care provider. ° °

## 2014-05-03 NOTE — ED Provider Notes (Signed)
Medical screening examination/treatment/procedure(s) were performed by non-physician practitioner and as supervising physician I was immediately available for consultation/collaboration.    Md Linwood DibblesJon Isiah Scheel, MD 05/03/14 [phone]

## 2014-05-12 ENCOUNTER — Encounter (HOSPITAL_COMMUNITY): Payer: Self-pay | Admitting: Emergency Medicine

## 2014-05-12 ENCOUNTER — Emergency Department (HOSPITAL_COMMUNITY)
Admission: EM | Admit: 2014-05-12 | Discharge: 2014-05-12 | Disposition: A | Discharge status: Home / Self Care | Payer: Self-pay | Attending: Emergency Medicine | Admitting: Emergency Medicine

## 2014-05-12 DIAGNOSIS — Z862 Personal history of diseases of the blood and blood-forming organs and certain disorders involving the immune mechanism: Secondary | ICD-10-CM | POA: Insufficient documentation

## 2014-05-12 DIAGNOSIS — Z880 Allergy status to penicillin: Secondary | ICD-10-CM | POA: Insufficient documentation

## 2014-05-12 DIAGNOSIS — M549 Dorsalgia, unspecified: Secondary | ICD-10-CM

## 2014-05-12 DIAGNOSIS — G8929 Other chronic pain: Secondary | ICD-10-CM | POA: Insufficient documentation

## 2014-05-12 DIAGNOSIS — F17200 Nicotine dependence, unspecified, uncomplicated: Secondary | ICD-10-CM | POA: Insufficient documentation

## 2014-05-12 DIAGNOSIS — M545 Low back pain, unspecified: Secondary | ICD-10-CM | POA: Insufficient documentation

## 2014-05-12 DIAGNOSIS — E119 Type 2 diabetes mellitus without complications: Secondary | ICD-10-CM | POA: Insufficient documentation

## 2014-05-12 DIAGNOSIS — F411 Generalized anxiety disorder: Secondary | ICD-10-CM | POA: Insufficient documentation

## 2014-05-12 DIAGNOSIS — I10 Essential (primary) hypertension: Secondary | ICD-10-CM | POA: Insufficient documentation

## 2014-05-12 DIAGNOSIS — Z8639 Personal history of other endocrine, nutritional and metabolic disease: Secondary | ICD-10-CM | POA: Insufficient documentation

## 2014-05-12 DIAGNOSIS — R110 Nausea: Secondary | ICD-10-CM | POA: Insufficient documentation

## 2014-05-12 LAB — CBG MONITORING, ED: Glucose-Capillary: 161 mg/dL — ABNORMAL HIGH (ref 70–99)

## 2014-05-12 MED ORDER — OXYCODONE-ACETAMINOPHEN 5-325 MG PO TABS
1.0000 | ORAL_TABLET | Freq: Four times a day (QID) | ORAL | Status: DC | PRN
Start: 2014-05-12 — End: 2014-05-16

## 2014-05-12 MED ORDER — OXYCODONE-ACETAMINOPHEN 5-325 MG PO TABS
2.0000 | ORAL_TABLET | Freq: Once | ORAL | Status: AC
  Administered 2014-05-12: 2 via ORAL
  Filled 2014-05-12: qty 2

## 2014-05-12 MED ORDER — OXYCODONE-ACETAMINOPHEN 5-325 MG PO TABS: 1.0000 | ORAL_TABLET | Freq: Three times a day (TID) | ORAL | Status: DC | PRN

## 2014-05-12 NOTE — Discharge Instructions (Signed)
Chronic Pain Stop ibuprofen. Ibuprofen can cause nausea. Take Tylenol for mild pain or the pain medicine prescribed for bad pain. Do not take the pain medicine prescribed together with on clonezepam, as the combination can be dangerous. Call your doctor tomorrow to tell him that you were here today. Have him write for further prescriptions for pain medicine for back pain for you until he gets into the pain clinic Chronic pain can be defined as pain that is off and on and lasts for 3-6 months or longer. Many things cause chronic pain, which can make it difficult to make a diagnosis. There are many treatment options available for chronic pain. However, finding a treatment that works well for you may require trying various approaches until the right one is found. Many people benefit from a combination of two or more types of treatment to control their pain. SYMPTOMS  Chronic pain can occur anywhere in the body and can range from mild to very severe. Some types of chronic pain include:  Headache.  Low back pain.  Cancer pain.  Arthritis pain.  Neurogenic pain. This is pain resulting from damage to nerves. People with chronic pain may also have other symptoms such as:  Depression.  Anger.  Insomnia.  Anxiety. DIAGNOSIS  Your health care provider will help diagnose your condition over time. In many cases, the initial focus will be on excluding possible conditions that could be causing the pain. Depending on your symptoms, your health care provider may order tests to diagnose your condition. Some of these tests may include:   Blood tests.   CT scan.   MRI.   X-rays.   Ultrasounds.   Nerve conduction studies.  You may need to see a specialist.  TREATMENT  Finding treatment that works well may take time. You may be referred to a pain specialist. He or she may prescribe medicine or therapies, such as:   Mindful meditation or yoga.  Shots (injections) of numbing or  pain-relieving medicines into the spine or area of pain.  Local electrical stimulation.  Acupuncture.   Massage therapy.   Aroma, color, light, or sound therapy.   Biofeedback.   Working with a physical therapist to keep from getting stiff.   Regular, gentle exercise.   Cognitive or behavioral therapy.   Group support.  Sometimes, surgery may be recommended.  HOME CARE INSTRUCTIONS   Take all medicines as directed by your health care provider.   Lessen stress in your life by relaxing and doing things such as listening to calming music.   Exercise or be active as directed by your health care provider.   Eat a healthy diet and include things such as vegetables, fruits, fish, and lean meats in your diet.   Keep all follow-up appointments with your health care provider.   Attend a support group with others suffering from chronic pain. SEEK MEDICAL CARE IF:   Your pain gets worse.   You develop a new pain that was not there before.   You cannot tolerate medicines given to you by your health care provider.   You have new symptoms since your last visit with your health care provider.  SEEK IMMEDIATE MEDICAL CARE IF:   You feel weak.   You have decreased sensation or numbness.   You lose control of bowel or bladder function.   Your pain suddenly gets much worse.   You develop shaking.  You develop chills.  You develop confusion.  You develop chest pain.  You develop shortness of breath.  MAKE SURE YOU:  Understand these instructions.  Will watch your condition.  Will get help right away if you are not doing well or get worse. Document Released: 06/02/2002 Document Revised: 05/13/2013 Document Reviewed: 03/06/2013 [employer] Patient Information 2015 [place], [place]. This information is not intended to replace advice given to you by your health care provider. Make sure you discuss any questions you have with your health care provider.

## 2014-05-12 NOTE — ED Notes (Signed)
Pt states chronic back pain, waiting to get a referral to pain clinic from cardiologist. Lower back pain "all in my spine"

## 2014-05-12 NOTE — ED Provider Notes (Addendum)
CSN: [id number]     Arrival date & time 05/12/14  1437 History   First MD Initiated Contact with Patient 05/12/14 1654     Chief Complaint  Patient presents with  . Back Pain     (Consider location/radiation/quality/duration/timing/severity/associated sxs/prior Treatment) HPI Complaint of low back pain radiating to both knees onset 3 days ago. Worse with movement improved with remaining still. No fever no loss of bladder or bowel control no trauma. No other associated symptoms. Patient suffers from chronic back pain. She's been treating herself with ibuprofen and Tylenol, without relief the Past Medical History  Diagnosis Date  . Hypertension   . High cholesterol   . Anxiety   . Diabetes mellitus without complication    panic attack Past Surgical History  Procedure Laterality Date  . Cholecystectomy    . Tonsillectomy    . Cesarean section    . Tubal ligation     No family history on file. History  Substance Use Topics  . Smoking status: Current Every Day Smoker -- 0.50 packs/day    Types: Cigarettes  . Smokeless tobacco: Not on file  . Alcohol Use: No   no illicit drug use OB History   Grav Para Term Preterm Abortions TAB SAB Ect Mult Living                 Review of Systems  Gastrointestinal: Positive for nausea.       Nausea since taking Motrin yesterday  Musculoskeletal: Positive for back pain.      Allergies  Cheese; Flexeril; Penicillins; Toradol; and Tramadol  Home Medications   Prior to Admission medications   Medication Sig Start Date End Date Taking? Authorizing Provider  clonazePAM (KLONOPIN) 0.5 MG tablet Take 0.5 mg by mouth 3 (three) times daily as needed for anxiety.   Yes Historical Provider, MD  ibuprofen (ADVIL,MOTRIN) 200 MG tablet Take 800 mg by mouth every 6 (six) hours as needed for moderate pain.    Yes Historical Provider, MD  lisinopril-hydrochlorothiazide (PRINZIDE,ZESTORETIC) 20-12.5 MG per tablet Take 1 tablet by mouth 2 (two) times  daily.   Yes Historical Provider, MD  metFORMIN (GLUCOPHAGE) 500 MG tablet Take 500 mg by mouth 2 (two) times daily with a meal.   Yes Historical Provider, MD  metoprolol tartrate (LOPRESSOR) 25 MG tablet Take 25 mg by mouth 2 (two) times daily.   Yes Historical Provider, MD  acetaminophen (TYLENOL) 500 MG tablet Take 1 tablet (500 mg total) by mouth every 6 (six) hours as needed. 02/09/14   Emilia BeckKaitlyn Szekalski, PA-C  oxyCODONE-acetaminophen (PERCOCET/ROXICET) 5-325 MG per tablet Take 1 tablet by mouth every 4 (four) hours as needed. 05/03/14   Tammy L. Triplett, PA-C   BP 107/64  Pulse 75  Temp(Src) 98.7 F (37.1 C) (Oral)  Resp 16  Ht 6' (1.829 m)  Wt 200 lb (90.719 kg)  BMI 27.12 kg/m2  SpO2 98% Physical Exam  Nursing note and vitals reviewed. Constitutional: She appears well-developed and well-nourished.  HENT:  Head: Normocephalic and atraumatic.  Eyes: Conjunctivae are normal. Pupils are equal, round, and reactive to light.  Neck: Neck supple. No tracheal deviation present. No thyromegaly present.  Cardiovascular: Normal rate and regular rhythm.   No murmur heard. Pulmonary/Chest: Effort normal and breath sounds normal.  Abdominal: Soft. Bowel sounds are normal. She exhibits no distension. There is no tenderness.  Musculoskeletal: Normal range of motion. She exhibits no edema and no tenderness.  Tender at lower thoracic and lumbar spine  Neurological: She is alert. She has normal reflexes. Coordination normal.  DTRs symmetric bilaterally knee jerk ankle jerk and biceps toes downgoing bilaterally. Gait normal  Skin: Skin is warm and dry. No rash noted.  Psychiatric: She has a normal mood and affect.    ED Course  Procedures (including critical care time) Labs Review Labs Reviewed  CBG MONITORING, ED - Abnormal; Notable for the following:    Glucose-Capillary 161 (*)    All other components within normal limits    Imaging Review No results found.   EKG  Interpretation None      MDM  Plan Percocet 2 tablets by mouth prior to discharge, prescription Percocet Final diagnoses:  None   Patient suffers from chronic back pain. I do not feel that she is need for imaging. Diagnosis #1 low back pain with lumbar radiculopathy #2 hyperglycemia    Doug Sou, MD 05/12/14 1717  Doug Sou, MD 05/12/14 1718

## 2014-05-16 ENCOUNTER — Emergency Department (HOSPITAL_COMMUNITY): Payer: Self-pay

## 2014-05-16 ENCOUNTER — Emergency Department (HOSPITAL_COMMUNITY)
Admission: EM | Admit: 2014-05-16 | Discharge: 2014-05-16 | Disposition: A | Discharge status: Home / Self Care | Payer: Self-pay | Attending: Emergency Medicine | Admitting: Emergency Medicine

## 2014-05-16 ENCOUNTER — Encounter (HOSPITAL_COMMUNITY): Payer: Self-pay | Admitting: Emergency Medicine

## 2014-05-16 DIAGNOSIS — Z79899 Other long term (current) drug therapy: Secondary | ICD-10-CM | POA: Insufficient documentation

## 2014-05-16 DIAGNOSIS — R52 Pain, unspecified: Secondary | ICD-10-CM | POA: Insufficient documentation

## 2014-05-16 DIAGNOSIS — E119 Type 2 diabetes mellitus without complications: Secondary | ICD-10-CM | POA: Insufficient documentation

## 2014-05-16 DIAGNOSIS — I10 Essential (primary) hypertension: Secondary | ICD-10-CM | POA: Insufficient documentation

## 2014-05-16 DIAGNOSIS — G8929 Other chronic pain: Secondary | ICD-10-CM | POA: Insufficient documentation

## 2014-05-16 DIAGNOSIS — Z880 Allergy status to penicillin: Secondary | ICD-10-CM | POA: Insufficient documentation

## 2014-05-16 DIAGNOSIS — F17200 Nicotine dependence, unspecified, uncomplicated: Secondary | ICD-10-CM | POA: Insufficient documentation

## 2014-05-16 DIAGNOSIS — Z791 Long term (current) use of non-steroidal anti-inflammatories (NSAID): Secondary | ICD-10-CM | POA: Insufficient documentation

## 2014-05-16 DIAGNOSIS — M545 Low back pain, unspecified: Secondary | ICD-10-CM | POA: Insufficient documentation

## 2014-05-16 DIAGNOSIS — F411 Generalized anxiety disorder: Secondary | ICD-10-CM | POA: Insufficient documentation

## 2014-05-16 LAB — CBG MONITORING, ED: GLUCOSE-CAPILLARY: 164 mg/dL — AB (ref 70–99)

## 2014-05-16 MED ORDER — METHOCARBAMOL 500 MG PO TABS
500.0000 mg | ORAL_TABLET | Freq: Once | ORAL | Status: AC
  Administered 2014-05-16: 500 mg via ORAL
  Filled 2014-05-16: qty 1

## 2014-05-16 MED ORDER — PREDNISONE 20 MG PO TABS
40.0000 mg | ORAL_TABLET | Freq: Once | ORAL | Status: AC
  Administered 2014-05-16: 40 mg via ORAL
  Filled 2014-05-16: qty 2

## 2014-05-16 MED ORDER — BUTALBITAL-APAP-CAFFEINE 50-325-40 MG PO TABS: 1.0000 | ORAL_TABLET | Freq: Four times a day (QID) | ORAL | Status: DC | PRN

## 2014-05-16 MED ORDER — OXYCODONE-ACETAMINOPHEN 5-325 MG PO TABS
1.0000 | ORAL_TABLET | Freq: Once | ORAL | Status: AC
  Administered 2014-05-16: 1 via ORAL
  Filled 2014-05-16: qty 1

## 2014-05-16 MED ORDER — METHOCARBAMOL 500 MG PO TABS: 500.0000 mg | ORAL_TABLET | Freq: Two times a day (BID) | ORAL | Status: DC

## 2014-05-16 NOTE — ED Provider Notes (Signed)
CSN: [id number]     Arrival date & time 05/16/14  1411 History  This chart was scribed for a non-physician practitioner, Kerrie Buffalo, NP-C, working with [employer]. Rubin Payor, MD by [place] Peace, ED Scribe. The patient was seen in APFT22/APFT22. The patient's care was started at 2:56 PM.    Chief Complaint  Patient presents with  . Back Pain      Patient is a 53 y.o. female presenting with back pain. The history is provided by the patient. No language interpreter was used.  Back Pain Location:  Lumbar spine Quality:  Shooting Radiates to:  L posterior upper leg and R posterior upper leg Pain severity:  Severe Pain is:  Same all the time Onset quality:  Sudden Duration:  2 hours Timing:  Constant Progression:  Worsening Context: falling   Relieved by:  Nothing Worsened by:  Bending, movement and twisting Associated symptoms: no abdominal pain and no fever   HPI Comments: Melissa Huynh is a 53 y.o. female who presents to the Emergency Department complaining of lower back pain. She has a history of chronic back pain with radiating pain to her legs with associated numbness and has been to the ED twice this month for pain medication. She states that she is here today because she was at her sister's house and fell against a dresser. Pt reports that she fell today at home and hit her lower back on the edge of her sister's dresser which has caused her pain to worsen. She states that she was on Percocet but ran out yesterday. She goes on to state that she has an appointment set up with a pain clinic for 05/27/2014. Pt denies urinary problems or loss of control of bladder or bowels. She is ambulatory to the ED.   Past Medical History  Diagnosis Date  . Hypertension   . High cholesterol   . Anxiety   . Diabetes mellitus without complication    Past Surgical History  Procedure Laterality Date  . Cholecystectomy    . Tonsillectomy    . Cesarean section    . Tubal ligation     No family  history on file. History  Substance Use Topics  . Smoking status: Current Every Day Smoker -- 0.50 packs/day    Types: Cigarettes  . Smokeless tobacco: Not on file  . Alcohol Use: No   OB History   Grav Para Term Preterm Abortions TAB SAB Ect Mult Living                 Review of Systems  Constitutional: Negative for fever and chills.  Gastrointestinal: Negative for nausea, vomiting, abdominal pain, diarrhea, blood in stool and anal bleeding.  Musculoskeletal: Positive for back pain (lower).  all other systems negative    Allergies  Cheese; Flexeril; Penicillins; Toradol; and Tramadol  Home Medications   Prior to Admission medications   Medication Sig Start Date End Date Taking? Authorizing Provider  acetaminophen (TYLENOL) 500 MG tablet Take 1 tablet (500 mg total) by mouth every 6 (six) hours as needed. 02/09/14   Kaitlyn Szekalski, PA-C  clonazePAM (KLONOPIN) 0.5 MG tablet Take 0.5 mg by mouth 3 (three) times daily as needed for anxiety.    Historical Provider, MD  ibuprofen (ADVIL,MOTRIN) 200 MG tablet Take 800 mg by mouth every 6 (six) hours as needed for moderate pain.     Historical Provider, MD  lisinopril-hydrochlorothiazide (PRINZIDE,ZESTORETIC) 20-12.5 MG per tablet Take 1 tablet by mouth 2 (two) times daily.  Historical Provider, MD  metFORMIN (GLUCOPHAGE) 500 MG tablet Take 500 mg by mouth 2 (two) times daily with a meal.    Historical Provider, MD  metoprolol tartrate (LOPRESSOR) 25 MG tablet Take 25 mg by mouth 2 (two) times daily.    Historical Provider, MD  oxyCODONE-acetaminophen (PERCOCET) 5-325 MG per tablet Take 1-2 tablets by mouth every 6 (six) hours as needed for severe pain. 05/12/14   Doug Sou, MD  oxyCODONE-acetaminophen (PERCOCET/ROXICET) 5-325 MG per tablet Take 1 tablet by mouth every 4 (four) hours as needed. 05/03/14   Tammy L. Triplett, PA-C   BP 121/87  Pulse 98  Temp(Src) 97.9 F (36.6 C) (Oral)  Resp 16  Ht 6' (1.829 m)  Wt 200 lb  (90.719 kg)  BMI 27.12 kg/m2  SpO2 99% Physical Exam  Nursing note and vitals reviewed. Constitutional: She is oriented to person, place, and time. She appears well-developed and well-nourished. No distress.  HENT:  Head: Normocephalic and atraumatic.  Right Ear: Tympanic membrane normal.  Left Ear: Tympanic membrane normal.  Nose: Nose normal.  Mouth/Throat: Uvula is midline, oropharynx is clear and moist and mucous membranes are normal.  Eyes: Conjunctivae and EOM are normal.  Neck: Normal range of motion. Neck supple. No spinous process tenderness present. No tracheal deviation present.  Cardiovascular: Normal rate and regular rhythm.   Pulmonary/Chest: Effort normal. No respiratory distress. She has no wheezes. She has no rales.  Abdominal: Soft. Bowel sounds are normal. There is no tenderness.  Musculoskeletal: Normal range of motion.       Lumbar back: She exhibits tenderness, pain and spasm. She exhibits normal pulse.  Neurological: She is alert and oriented to person, place, and time. She has normal strength. No cranial nerve deficit or sensory deficit. Gait normal.  Reflex Scores:      Bicep reflexes are 2+ on the right side and 2+ on the left side.      Brachioradialis reflexes are 2+ on the right side and 2+ on the left side.      Patellar reflexes are 2+ on the right side and 2+ on the left side.      Achilles reflexes are 2+ on the right side and 2+ on the left side. Pedal pulses equal adequate circulation. Ambulatory without foot drag.   Skin: Skin is warm and dry.  Psychiatric: She has a normal mood and affect. Her behavior is normal.    ED Course  Procedures (including critical care time) Labs Review Labs Reviewed  CBG MONITORING, ED - Abnormal; Notable for the following:    Glucose-Capillary 164 (*)    All other components within normal limits   Results for orders placed during the hospital encounter of 05/16/14  CBG MONITORING, ED      Result Value Ref Range    Glucose-Capillary 164 (*) 70 - 99 mg/dL   Dg Lumbar Spine Complete  05/16/2014   CLINICAL DATA:  Low back pain with lower extremity numbness; recent trauma  EXAM: LUMBAR SPINE - COMPLETE 4+ VIEW  COMPARISON:  Feb 09, 2014  FINDINGS: Frontal, lateral, spot lumbosacral lateral, and bilateral oblique views were obtained. There are 5 non-rib-bearing lumbar type vertebral bodies. There is mild thoracolumbar dextroscoliosis. There is no fracture or spondylolisthesis. There is moderate disc space narrowing at L2-3, L4-5, and L5-S1. There is facet osteoarthritic change at all levels bilaterally.  IMPRESSION: Multilevel osteoarthritic change.  No fracture or spondylolisthesis.   Electronically Signed   By: Bretta Bang M.D.  On: 05/16/2014 15:39    3:03 PM- Treatment plan was discussed with patient who verbalizes understanding and agrees.  I discussed this case with Dr. Hyacinth Meeker, will give patient Fioricet and muscle relaxant.   MDM  53 y.o. female with low back pain that is chronic and acute low back pain due to contusion of lower back. Stable for discharge without neuro deficits. Will treat for pain and she will keep her appointment with the pain management clinic. I have reviewed this patient's vital signs, nurses notes, appropriate labs and imaging.  I have discussed findings and plan of care with the patient and she voices understanding.    Medication List    TAKE these medications       butalbital-acetaminophen-caffeine 50-325-40 MG per tablet  Commonly known as:  FIORICET  Take 1 tablet by mouth every 6 (six) hours as needed (pain).     methocarbamol 500 MG tablet  Commonly known as:  ROBAXIN  Take 1 tablet (500 mg total) by mouth 2 (two) times daily.      ASK your doctor about these medications       acetaminophen 500 MG tablet  Commonly known as:  TYLENOL  Take 1 tablet (500 mg total) by mouth every 6 (six) hours as needed.     clonazePAM 0.5 MG tablet  Commonly known as:   KLONOPIN  Take 0.5 mg by mouth 3 (three) times daily as needed for anxiety.     ibuprofen 200 MG tablet  Commonly known as:  ADVIL,MOTRIN  Take 800 mg by mouth every 6 (six) hours as needed for moderate pain.     lisinopril-hydrochlorothiazide 20-12.5 MG per tablet  Commonly known as:  PRINZIDE,ZESTORETIC  Take 1 tablet by mouth 2 (two) times daily.     metFORMIN 500 MG tablet  Commonly known as:  GLUCOPHAGE  Take 500 mg by mouth 2 (two) times daily with a meal.     metoprolol tartrate 25 MG tablet  Commonly known as:  LOPRESSOR  Take 25 mg by mouth 2 (two) times daily.        I personally performed the services described in this documentation, which was scribed in my presence. The recorded information has been reviewed and is accurate.    [facility] Orlene Och, [place] 05/17/14 [phone]

## 2014-05-16 NOTE — ED Notes (Signed)
Pt has hx  lower back pain that radiates down both legs that started "awhile ago" pt reports that she fell today tripping in sisters bedroom making the pain worse, pt denies any problems with urinary or bowel function, states that she has an appointment to be set up with pain clinic 05/27/2014

## 2014-05-17 NOTE — ED Provider Notes (Signed)
Medical screening examination/treatment/procedure(s) were performed by non-physician practitioner and as supervising physician I was immediately available for consultation/collaboration.    Vida Roller, MD 05/17/14 [phone]

## 2014-06-23 ENCOUNTER — Encounter (HOSPITAL_COMMUNITY): Payer: Self-pay | Admitting: Emergency Medicine

## 2014-06-23 ENCOUNTER — Emergency Department (HOSPITAL_COMMUNITY): Payer: Self-pay

## 2014-06-23 ENCOUNTER — Emergency Department (HOSPITAL_COMMUNITY)
Admission: EM | Admit: 2014-06-23 | Discharge: 2014-06-24 | Disposition: A | Discharge status: Home / Self Care | Payer: Self-pay | Attending: Emergency Medicine | Admitting: Emergency Medicine

## 2014-06-23 DIAGNOSIS — F419 Anxiety disorder, unspecified: Secondary | ICD-10-CM | POA: Insufficient documentation

## 2014-06-23 DIAGNOSIS — Y9389 Activity, other specified: Secondary | ICD-10-CM | POA: Insufficient documentation

## 2014-06-23 DIAGNOSIS — G8929 Other chronic pain: Secondary | ICD-10-CM | POA: Insufficient documentation

## 2014-06-23 DIAGNOSIS — S3992XA Unspecified injury of lower back, initial encounter: Secondary | ICD-10-CM | POA: Insufficient documentation

## 2014-06-23 DIAGNOSIS — Y9289 Other specified places as the place of occurrence of the external cause: Secondary | ICD-10-CM | POA: Insufficient documentation

## 2014-06-23 DIAGNOSIS — M5431 Sciatica, right side: Secondary | ICD-10-CM

## 2014-06-23 DIAGNOSIS — E119 Type 2 diabetes mellitus without complications: Secondary | ICD-10-CM | POA: Insufficient documentation

## 2014-06-23 DIAGNOSIS — F17210 Nicotine dependence, cigarettes, uncomplicated: Secondary | ICD-10-CM | POA: Insufficient documentation

## 2014-06-23 DIAGNOSIS — M25561 Pain in right knee: Secondary | ICD-10-CM

## 2014-06-23 DIAGNOSIS — I10 Essential (primary) hypertension: Secondary | ICD-10-CM | POA: Insufficient documentation

## 2014-06-23 DIAGNOSIS — S8991XA Unspecified injury of right lower leg, initial encounter: Secondary | ICD-10-CM | POA: Insufficient documentation

## 2014-06-23 DIAGNOSIS — W010XXA Fall on same level from slipping, tripping and stumbling without subsequent striking against object, initial encounter: Secondary | ICD-10-CM | POA: Insufficient documentation

## 2014-06-23 MED ORDER — DIAZEPAM 5 MG PO TABS
5.0000 mg | ORAL_TABLET | Freq: Once | ORAL | Status: AC
Start: 2014-06-23 — End: 2014-06-23
  Administered 2014-06-23: 5 mg via ORAL
  Filled 2014-06-23: qty 1

## 2014-06-23 MED ORDER — OXYCODONE-ACETAMINOPHEN 5-325 MG PO TABS
2.0000 | ORAL_TABLET | Freq: Once | ORAL | Status: AC
  Administered 2014-06-23: 2 via ORAL
  Filled 2014-06-23: qty 2

## 2014-06-23 MED ORDER — IBUPROFEN 800 MG PO TABS
800.0000 mg | ORAL_TABLET | Freq: Once | ORAL | Status: AC
  Administered 2014-06-23: 800 mg via ORAL
  Filled 2014-06-23: qty 1

## 2014-06-23 NOTE — ED Notes (Signed)
Also c/o diarrhea since yesterday

## 2014-06-23 NOTE — ED Notes (Signed)
Delay explained to family member, verbalized understanding. Patient complaining of increasing pain at this time.

## 2014-06-23 NOTE — ED Provider Notes (Signed)
This chart was scribed for Layla Maw Tyr Franca, DO by Milly Jakob, ED Scribe. The patient was seen in room APA08/APA08. Patient's care was started at 10:29 PM.  CHIEF COMPLAINT: Back Pain  HPI:  HPI Comments: Melissa Huynh is a 53 y.o. female with a history of HTN, High cholesterol, and DM who presents to the Emergency Department complaining of severe, constant back pain that radiates down her right leg. She reports severe, constant pain in her right knee. She reports that she was helping someone move yesterday, and she threw out her back and fell to the ground. She reports that she rolled over onto her knee trying to get up. She reports that she is scheduled to meet with Dr. Harlon Flor on October 8th for pain management for her chronic back pain.  Patient denies numbness, tingling, focal weakness, bowel or bladder incontinence. No fever. No history of back surgery or epidural injections.  ROS: See HPI Constitutional: no fever  Eyes: no drainage  ENT: no runny nose   Cardiovascular:  no chest pain  Resp: no SOB  GI: no vomiting GU: no dysuria Integumentary: no rash  Allergy: no hives  Musculoskeletal: no leg swelling  Neurological: no slurred speech ROS otherwise negative  PAST MEDICAL HISTORY/PAST SURGICAL HISTORY:  Past Medical History  Diagnosis Date  . Hypertension   . High cholesterol   . Anxiety   . Diabetes mellitus without complication     MEDICATIONS:  Prior to Admission medications   Medication Sig Start Date End Date Taking? Authorizing Provider  acetaminophen (TYLENOL) 500 MG tablet Take 1 tablet (500 mg total) by mouth every 6 (six) hours as needed. 02/09/14  Yes Kaitlyn Szekalski, PA-C  clonazePAM (KLONOPIN) 0.5 MG tablet Take 0.5 mg by mouth 3 (three) times daily as needed for anxiety.   Yes Historical Provider, MD  ibuprofen (ADVIL,MOTRIN) 200 MG tablet Take 800 mg by mouth every 6 (six) hours as needed for moderate pain.    Yes Historical Provider, MD   lisinopril-hydrochlorothiazide (PRINZIDE,ZESTORETIC) 20-12.5 MG per tablet Take 1 tablet by mouth 2 (two) times daily.   Yes Historical Provider, MD  metFORMIN (GLUCOPHAGE) 500 MG tablet Take 500 mg by mouth 2 (two) times daily with a meal.   Yes Historical Provider, MD  metoprolol tartrate (LOPRESSOR) 25 MG tablet Take 25 mg by mouth 2 (two) times daily.   Yes Historical Provider, MD    ALLERGIES:  Allergies  Allergen Reactions  . Cheese   . Flexeril [Cyclobenzaprine] Other (See Comments)    Tachycardia   . Penicillins Hives  . Toradol [Ketorolac Tromethamine] Rash  . Tramadol Rash    SOCIAL HISTORY:  History  Substance Use Topics  . Smoking status: Current Every Day Smoker -- 0.50 packs/day    Types: Cigarettes  . Smokeless tobacco: Not on file  . Alcohol Use: No    FAMILY HISTORY: History reviewed. No pertinent family history.  EXAM: Triage Vitals: BP 138/78  Pulse 96  Temp(Src) 98.2 F (36.8 C) (Oral)  Resp 20  Ht 6' (1.829 m)  Wt 199 lb (90.266 kg)  BMI 26.98 kg/m2  SpO2 100%  LMP 01/18/2014  CONSTITUTIONAL: Alert and oriented and responds appropriately to questions. Well-appearing; well-nourished, appears uncomfortable but nontoxic HEAD: Normocephalic EYES: Conjunctivae clear, PERRL ENT: normal nose; no rhinorrhea; moist mucous membranes; pharynx without lesions noted NECK: Supple, no meningismus, no LAD; no midline spinal tenderness or step-off or deformity  CARD: RRR; S1 and S2 appreciated; no murmurs, no clicks,  no rubs, no gallops RESP: Normal chest excursion without splinting or tachypnea; breath sounds clear and equal bilaterally; no wheezes, no rhonchi, no rales,  ABD/GI: Normal bowel sounds; non-distended; soft, non-tender, no rebound, no guarding BACK:  The back appears normal and is tender to palpation diffusely throughout the thoracic and lumbar spine with midline tenderness and bilateral paraspinal tenderness and spasm, no step-off or deformity, no  CVA tenderness EXT: Tender to palpation over the right patella without obvious ecchymosis or swelling or deformity, no joint effusion, Normal ROM in all joints; no ligamentous laxity, otherwise extremities are non-tender to palpation; no edema; normal capillary refill; no cyanosis    SKIN: Normal color for age and race; warm NEURO: Moves all extremities equally strength 5/5 in all 4 extremities, sensation to light touch intact diffusely, 2+ deep tendon reflexes in bilateral upper and lower extremities, no clonus PSYCH: The patient's mood and manner are appropriate. Grooming and personal hygiene are appropriate.  MEDICAL DECISION MAKING: Patient here with mechanical fall complaining of back pain. She has a history of chronic back pain. She's also complaining of right knee pain. She is neurovascularly intact on exam. No neurologic deficits. We'll obtain x-rays of her thoracic and lumbar spine given she does have midline spinal tenderness or port she had a fall. We'll give pain medication and reassess. We'll also obtain an x-ray of her right knee and she has significant tenderness over her patella with no ligamentous laxity or joint effusion. Anticipate if workup is negative, patient will be discharged home with pain medication. She has an appointment with a pain management specialist October 8.  ED PROGRESS: X-ray is unremarkable for acute injury. We'll discharge with pain medication. Have discussed supportive care instructions and return precautions. She has outpatient followup scheduled.      I personally performed the services described in this documentation, which was scribed in my presence. The recorded information has been reviewed and is accurate.      Layla MawKristen N Helton Oleson, DO 06/24/14 0021

## 2014-06-23 NOTE — ED Notes (Signed)
Pt slipped and fell, landing on her back yesterday, c/o lower back pain that radiates down right leg

## 2014-06-24 MED ORDER — DIAZEPAM 5 MG PO TABS: 5.0000 mg | ORAL_TABLET | Freq: Three times a day (TID) | ORAL | Status: DC | PRN

## 2014-06-24 MED ORDER — MORPHINE SULFATE 4 MG/ML IJ SOLN
4.0000 mg | Freq: Once | INTRAMUSCULAR | Status: AC
  Administered 2014-06-24: 4 mg via INTRAMUSCULAR
  Filled 2014-06-24: qty 1

## 2014-06-24 MED ORDER — OXYCODONE-ACETAMINOPHEN 5-325 MG PO TABS: 1.0000 | ORAL_TABLET | ORAL | Status: DC | PRN

## 2014-06-24 MED ORDER — IBUPROFEN 800 MG PO TABS: 800.0000 mg | ORAL_TABLET | Freq: Three times a day (TID) | ORAL | Status: DC | PRN

## 2014-06-24 NOTE — Discharge Instructions (Signed)
Knee Pain °The knee is the complex joint between your thigh and your lower leg. It is made up of bones, tendons, ligaments, and cartilage. The bones that make up the knee are: °· The femur in the thigh. °· The tibia and fibula in the lower leg. °· The patella or kneecap riding in the groove on the lower femur. °CAUSES  °Knee pain is a common complaint with many causes. A few of these causes are: °· Injury, such as: °¨ A ruptured ligament or tendon injury. °¨ Torn cartilage. °· Medical conditions, such as: °¨ Gout °¨ Arthritis °¨ Infections °· Overuse, over training, or overdoing a physical activity. °Knee pain can be minor or severe. Knee pain can accompany debilitating injury. Minor knee problems often respond well to self-care measures or get well on their own. More serious injuries may need medical intervention or even surgery. °SYMPTOMS °The knee is complex. Symptoms of knee problems can vary widely. Some of the problems are: °· Pain with movement and weight bearing. °· Swelling and tenderness. °· Buckling of the knee. °· Inability to straighten or extend your knee. °· Your knee locks and you cannot straighten it. °· Warmth and redness with pain and fever. °· Deformity or dislocation of the kneecap. °DIAGNOSIS  °Determining what is wrong may be very straight forward such as when there is an injury. It can also be challenging because of the complexity of the knee. Tests to make a diagnosis may include: °· Your caregiver taking a history and doing a physical exam. °· Routine X-rays can be used to rule out other problems. X-rays will not reveal a cartilage tear. Some injuries of the knee can be diagnosed by: °¨ Arthroscopy a surgical technique by which a small video camera is inserted through tiny incisions on the sides of the knee. This procedure is used to examine and repair internal knee joint problems. Tiny instruments can be used during arthroscopy to repair the torn knee cartilage (meniscus). °¨ Arthrography  is a radiology technique. A contrast liquid is directly injected into the knee joint. Internal structures of the knee joint then become visible on X-ray film. °¨ An MRI scan is a non X-ray radiology procedure in which magnetic fields and a computer produce two- or three-dimensional images of the inside of the knee. Cartilage tears are often visible using an MRI scanner. MRI scans have largely replaced arthrography in diagnosing cartilage tears of the knee. °· Blood work. °· Examination of the fluid that helps to lubricate the knee joint (synovial fluid). This is done by taking a sample out using a needle and a syringe. °TREATMENT °The treatment of knee problems depends on the cause. Some of these treatments are: °· Depending on the injury, proper casting, splinting, surgery, or physical therapy care will be needed. °· Give yourself adequate recovery time. Do not overuse your joints. If you begin to get sore during workout routines, back off. Slow down or do fewer repetitions. °· For repetitive activities such as cycling or running, maintain your strength and nutrition. °· Alternate muscle groups. For example, if you are a weight lifter, work the upper body on one day and the lower body the next. °· Either tight or weak muscles do not give the proper support for your knee. Tight or weak muscles do not absorb the stress placed on the knee joint. Keep the muscles surrounding the knee strong. °· Take care of mechanical problems. °¨ If you have flat feet, orthotics or special shoes may help.   See your caregiver if you need help. °¨ Arch supports, sometimes with wedges on the inner or outer aspect of the heel, can help. These can shift pressure away from the side of the knee most bothered by osteoarthritis. °¨ A brace called an "unloader" brace also may be used to help ease the pressure on the most arthritic side of the knee. °· If your caregiver has prescribed crutches, braces, wraps or ice, use as directed. The acronym  for this is PRICE. This means protection, rest, ice, compression, and elevation. °· Nonsteroidal anti-inflammatory drugs (NSAIDs), can help relieve pain. But if taken immediately after an injury, they may actually increase swelling. Take NSAIDs with food in your stomach. Stop them if you develop stomach problems. Do not take these if you have a history of ulcers, stomach pain, or bleeding from the bowel. Do not take without your caregiver's approval if you have problems with fluid retention, heart failure, or kidney problems. °· For ongoing knee problems, physical therapy may be helpful. °· Glucosamine and chondroitin are over-the-counter dietary supplements. Both may help relieve the pain of osteoarthritis in the knee. These medicines are different from the usual anti-inflammatory drugs. Glucosamine may decrease the rate of cartilage destruction. °· Injections of a corticosteroid drug into your knee joint may help reduce the symptoms of an arthritis flare-up. They may provide pain relief that lasts a few months. You may have to wait a few months between injections. The injections do have a small increased risk of infection, water retention, and elevated blood sugar levels. °· Hyaluronic acid injected into damaged joints may ease pain and provide lubrication. These injections may work by reducing inflammation. A series of shots may give relief for as long as 6 months. °· Topical painkillers. Applying certain ointments to your skin may help relieve the pain and stiffness of osteoarthritis. Ask your pharmacist for suggestions. Many over the-counter products are approved for temporary relief of arthritis pain. °· In some countries, doctors often prescribe topical NSAIDs for relief of chronic conditions such as arthritis and tendinitis. A review of treatment with NSAID creams found that they worked as well as oral medications but without the serious side effects. °PREVENTION °· Maintain a healthy weight. Extra pounds  put more strain on your joints. °· Get strong, stay limber. Weak muscles are a common cause of knee injuries. Stretching is important. Include flexibility exercises in your workouts. °· Be smart about exercise. If you have osteoarthritis, chronic knee pain or recurring injuries, you may need to change the way you exercise. This does not mean you have to stop being active. If your knees ache after jogging or playing basketball, consider switching to swimming, water aerobics, or other low-impact activities, at least for a few days a week. Sometimes limiting high-impact activities will provide relief. °· Make sure your shoes fit well. Choose footwear that is right for your sport. °· Protect your knees. Use the proper gear for knee-sensitive activities. Use kneepads when playing volleyball or laying carpet. Buckle your seat belt every time you drive. Most shattered kneecaps occur in car accidents. °· Rest when you are tired. °SEEK MEDICAL CARE IF:  °You have knee pain that is continual and does not seem to be getting better.  °SEEK IMMEDIATE MEDICAL CARE IF:  °Your knee joint feels hot to the touch and you have a high fever. °MAKE SURE YOU:  °· Understand these instructions. °· Will watch your condition. °· Will get help right away if you are not   doing well or get worse. Document Released: 07/08/2007 Document Revised: 12/03/2011 Document Reviewed: 07/08/2007 [facility] Patient Information 2015 [place], [place]. This information is not intended to replace advice given to you by your health care provider. Make sure you discuss any questions you have with your health care provider.  Sciatica Sciatica is pain, weakness, numbness, or tingling along the path of the sciatic nerve. The nerve starts in the lower back and runs down the back of each leg. The nerve controls the muscles in the lower leg and in the back of the knee, while also providing sensation to the back of the thigh, lower leg, and the sole of your foot.  Sciatica is a symptom of another medical condition. For instance, nerve damage or certain conditions, such as a herniated disk or bone spur on the spine, pinch or put pressure on the sciatic nerve. This causes the pain, weakness, or other sensations normally associated with sciatica. Generally, sciatica only affects one side of the body. CAUSES   Herniated or slipped disc.  Degenerative disk disease.  A pain disorder involving the narrow muscle in the buttocks (piriformis syndrome).  Pelvic injury or fracture.  Pregnancy.  Tumor (rare). SYMPTOMS  Symptoms can vary from mild to very severe. The symptoms usually travel from the low back to the buttocks and down the back of the leg. Symptoms can include:  Mild tingling or dull aches in the lower back, leg, or hip.  Numbness in the back of the calf or sole of the foot.  Burning sensations in the lower back, leg, or hip.  Sharp pains in the lower back, leg, or hip.  Leg weakness.  Severe back pain inhibiting movement. These symptoms may get worse with coughing, sneezing, laughing, or prolonged sitting or standing. Also, being overweight may worsen symptoms. DIAGNOSIS  Your caregiver will perform a physical exam to look for common symptoms of sciatica. He or she may ask you to do certain movements or activities that would trigger sciatic nerve pain. Other tests may be performed to find the cause of the sciatica. These may include:  Blood tests.  X-rays.  Imaging tests, such as an MRI or CT scan. TREATMENT  Treatment is directed at the cause of the sciatic pain. Sometimes, treatment is not necessary and the pain and discomfort goes away on its own. If treatment is needed, your caregiver may suggest:  Over-the-counter medicines to relieve pain.  Prescription medicines, such as anti-inflammatory medicine, muscle relaxants, or narcotics.  Applying heat or ice to the painful area.  Steroid injections to lessen pain, irritation, and  inflammation around the nerve.  Reducing activity during periods of pain.  Exercising and stretching to strengthen your abdomen and improve flexibility of your spine. Your caregiver may suggest losing weight if the extra weight makes the back pain worse.  Physical therapy.  Surgery to eliminate what is pressing or pinching the nerve, such as a bone spur or part of a herniated disk. HOME CARE INSTRUCTIONS   Only take over-the-counter or prescription medicines for pain or discomfort as directed by your caregiver.  Apply ice to the affected area for 20 minutes, 3-4 times a day for the first 48-72 hours. Then try heat in the same way.  Exercise, stretch, or perform your usual activities if these do not aggravate your pain.  Attend physical therapy sessions as directed by your caregiver.  Keep all follow-up appointments as directed by your caregiver.  Do not wear high heels or shoes that do  not provide proper support.  Check your mattress to see if it is too soft. A firm mattress may lessen your pain and discomfort. SEEK IMMEDIATE MEDICAL CARE IF:   You lose control of your bowel or bladder (incontinence).  You have increasing weakness in the lower back, pelvis, buttocks, or legs.  You have redness or swelling of your back.  You have a burning sensation when you urinate.  You have pain that gets worse when you lie down or awakens you at night.  Your pain is worse than you have experienced in the past.  Your pain is lasting longer than 4 weeks.  You are suddenly losing weight without reason. MAKE SURE YOU:  Understand these instructions.  Will watch your condition.  Will get help right away if you are not doing well or get worse. Document Released: 09/04/2001 Document Revised: 03/11/2012 Document Reviewed: 01/20/2012 [facility] Patient Information 2015 [place], [place]. This information is not intended to replace advice given to you by your health care provider. Make sure you  discuss any questions you have with your health care provider.    Emergency Department Resource Guide 1) Find a Doctor and Pay Out of Pocket Although you won't have to find out who is covered by your insurance plan, it is a good idea to ask around and get recommendations. You will then need to call the office and see if the doctor you have chosen will accept you as a new patient and what types of options they offer for patients who are self-pay. Some doctors offer discounts or will set up payment plans for their patients who do not have insurance, but you will need to ask so you aren't surprised when you get to your appointment.  2) Contact Your Local Health Department Not all health departments have doctors that can see patients for sick visits, but many do, so it is worth a call to see if yours does. If you don't know where your local health department is, you can check in your phone book. The CDC also has a tool to help you locate your state's health department, and many state websites also have listings of all of their local health departments.  3) Find a Walk-in Clinic If your illness is not likely to be very severe or complicated, you may want to try a walk in clinic. These are popping up all over the country in pharmacies, drugstores, and shopping centers. They're usually staffed by nurse practitioners or physician assistants that have been trained to treat common illnesses and complaints. They're usually fairly quick and inexpensive. However, if you have serious medical issues or chronic medical problems, these are probably not your best option.  No Primary Care Doctor: - Call Health Connect at  [phone] - they can help you locate a primary care doctor that  accepts your insurance, provides certain services, etc. - Physician Referral Service- [phone]  Chronic Pain Problems: Organization         Address  Phone   Notes  [facility]  [phone] Patients need to  be referred by their primary care doctor.   Medication Assistance: Organization         Address  Phone   Notes  [facility] Medication [facility] [address] [place] [place], [place] [address] [phone] --Must be a resident of [facility] -- Must have NO insurance coverage whatsoever (no Medicaid/ Medicare, etc.) -- The pt. MUST have a primary care doctor that  directs their care regularly and follows them in the community   [facility]ist  [phone]   [employer]  [phone]    Agencies that provide inexpensive medical care: Organization         Address  Phone   Notes  [facility]  [phone]   [facility]    [phone]   [facility] [address] [place], [place] [address] [phone]) [phone]   [facility] [address], [place] [phone]   [facility]    [phone]   [facility]    [phone]   [facility]  [address], [place] Phone:  [phone], Fax:  [phone] Hours of Operation:  9 am - 6 pm, M-F.  Also accepts Medicaid/Medicare and self-pay.  [facility]  [address], [place] Phone: [phone], Fax: [phone]. Hours of Operation:  8:30 am - 5:30 pm, M-F.  Also accepts Medicaid and self-pay.  [facility] [place] [address], [place] Phone: [phone]   [facility] [address], [place], [place] [phone], Ext. [phone] Mondays & Thursdays: 7-9 AM.  First 15 patients are seen on a first come, first serve basis.    Medicaid-accepting [facility] Providers:  Organization         Address  Phone   Notes  [facility] [address], [place] [phone] Also accepts self-pay patients.  [facility] [address], [place]  [phone]   [facility] [address], [place] [phone]   [facility] [address] [place] [address], [place] [phone]   Renaye RakersVeita Bland [address], [place]   [phone] Only accepts [place] Access [place] patients after they have their name applied to their card.   Self-Pay (no insurance) in [facility]:  Organization         Address  Phone   Notes  Sickle Cell Patients, [facility] [address], [place] [phone]   [facility] [address], [place] [phone]   [facility] [place]  [address], [place] [phone]   [facility]/Dr. Osei-Bonsu  [address] [place] [address], [place] or [phone], [place] [phone]) [phone] Phone number for both [place] and [place] locations is the same.  [facility] [address], [place] [phone]   [facility]  [address] [place] [address], [place] or [address] [phone]   [facility] [address], [place] [phone], phone; [phone], fax Sees patients 1st and 3rd Saturday of every month.  Must not qualify for public or private insurance (i.e. Medicaid, Medicare, [place] Health Choice, Veterans' Benefits)  Household income should be no more than 200% of the poverty level The clinic cannot treat you if you are pregnant or think you are pregnant  Sexually transmitted diseases are not treated at the clinic.    Dental Care: Organization         Address  Phone  Notes  [facility] [address], [place] [phone] Accepts children up to  age 62 who are enrolled in Medicaid or [place] Health Choice; pregnant women with a Medicaid card; and children who have applied for Medicaid or [place] Health Choice, but were declined, whose parents can  pay a reduced fee at time of service.  [facility]  [address], [place] [phone] Accepts children up to age 4 who are enrolled in [place] or [place] Health Choice; pregnant women with a Medicaid card; and children who have applied for Medicaid or [place] Health Choice, but were declined, whose parents can pay a reduced fee at time of service.  [facility]  [address] [place], [place] [phone] Patients are seen by appointment only. Walk-ins are not accepted. [facility] will see patients 42 years of age and older. Monday - Tuesday (8am-5pm) Most Wednesdays (8:30-5pm) $30 per visit, cash only  [facility]  [address], [facility] [phone] Patients are seen by appointment only. Walk-ins are not accepted. [facility] will see patients 61 years of age and older. One Wednesday Evening (Monthly: Volunteer Based).  $30 per visit, cash only  [employer]  [phone] for adults; Children under age 56, call Graduate Pediatric Dentistry at [phone]. Children aged 64-14, please call [phone] to request a pediatric application.  Dental services are provided in all areas of dental care including fillings, crowns and bridges, complete and partial dentures, implants, gum treatment, root canals, and extractions. Preventive care is also provided. Treatment is provided to both adults and children. Patients are selected via a lottery and there is often a waiting list.   [facility] [address] [place]  [phone] [email or web address]   [facility] [address] [place], [place] [phone], Ext. [phone] Second and Fourth Thursday of each month, opens at 6:30 AM; Clinic ends at 9 AM.  Patients are seen on a first-come first-served basis, and a limited number are seen during each clinic.   [facility]  [address] [place], [place] [phone]   Eligibility Requirements You must have lived in [place], [place], or [place] counties for at least the last three months.   You cannot be eligible for state or federal sponsored [place], including [employer], [place], or [employer].   You generally cannot be eligible for healthcare insurance through your employer.    How to apply: Eligibility screenings are held every Tuesday and Wednesday afternoon from 1:00 pm until 4:00 pm. You do not need an appointment for the interview!  [facility] [address] [place], [place] [phone]   [facility] Health Department  [phone]   [facility] Health Department  [phone]   [facility] Health Department  [phone]    Behavioral Health Resources in the Community: Intensive Outpatient Programs Organization         Address  Phone  Notes  [facility] [address], [place], [place] [phone]   [facility] [address] [place], [place] [phone]   [facility] [address] [place], [place]  [phone]   [facility] Mental Health [address],  [place], [place] [phone] or [phone]   Substance Abuse Resources Organization         Address  Phone  Notes  [facility] Drug Services  [phone]   [facility]  [phone]   [facility] [place]  [phone]   Floydene Flock  [phone]   Residential & Outpatient Substance Abuse Program  [phone]   Psychological Services Organization         Address  Phone  Notes  [facility]  [phone]   [facility]  [phone]   [facility] [address], [place] [phone] or [phone]    Mobile Crisis Teams Organization         Address  Phone  Notes  [facility], Mobile Crisis Care Unit  [phone]    [facility]  [address] [place], [place] [phone]   Doristine Locks [address] [place] [phone]    Self-Help/Support Groups Organization         Address  Phone             Notes  [facility] [place] - variety of support groups  [phone] Call for more information  Narcotics Anonymous (NA), [facility] [address], [employer] [place]  2 meetings at this location   Statistician         Address  Phone  Notes  [facility] [address],    [place]  [phone]   [facility]  [address] [place] [address], [place], [place] [phone]   [facility] [address] [place], [place] [phone] Admissions: 8am-3pm M-F  [facility] [address]    [place], [place] [phone]   [facility] [address] [place], [place], [place] [phone]   [facility] [address]  [place], [place] [phone]   [facility] Intensive Outpatient [address], [place], [place] [phone]   [facility] [address] [place]  [place], [place] [phone] or [phone]   [facility] [address] [place], [place] [phone] Accepts Medicaid  [facility] [address]  [place] [phone] Substance Abuse/Addiction Treatment   [facility]         Address  Phone  Notes  [facility]  [phone]   Angie Fava, PhD [address] Knack [place], [place]   [phone] or [phone]   [facility]   [address] [place], [place] [phone]   [facility] [address] [place], [place] [phone] Insurance/Medicaid/sponsorship through [facility] [address]                                     [place], [place] [phone] Therapy/tele-psych/case  [facility] [address], [place] [phone]    Dr. Lolly Mustache  [phone]   [facility]  [employer] [facility] 1) [address] [place] 2) [address], [place] 3)  [address], [place] [phone]  [phone]   [facility] Child Abuse Hotline [phone] or [phone] (After Hours)

## 2014-07-22 ENCOUNTER — Emergency Department (HOSPITAL_COMMUNITY)
Admission: EM | Admit: 2014-07-22 | Discharge: 2014-07-22 | Disposition: A | Discharge status: Home / Self Care | Payer: Self-pay | Attending: Emergency Medicine | Admitting: Emergency Medicine

## 2014-07-22 ENCOUNTER — Encounter (HOSPITAL_COMMUNITY): Payer: Self-pay | Admitting: Emergency Medicine

## 2014-07-22 DIAGNOSIS — N390 Urinary tract infection, site not specified: Secondary | ICD-10-CM

## 2014-07-22 DIAGNOSIS — E119 Type 2 diabetes mellitus without complications: Secondary | ICD-10-CM | POA: Insufficient documentation

## 2014-07-22 DIAGNOSIS — F419 Anxiety disorder, unspecified: Secondary | ICD-10-CM | POA: Insufficient documentation

## 2014-07-22 DIAGNOSIS — Z79899 Other long term (current) drug therapy: Secondary | ICD-10-CM | POA: Insufficient documentation

## 2014-07-22 DIAGNOSIS — R05 Cough: Secondary | ICD-10-CM | POA: Insufficient documentation

## 2014-07-22 DIAGNOSIS — I10 Essential (primary) hypertension: Secondary | ICD-10-CM | POA: Insufficient documentation

## 2014-07-22 DIAGNOSIS — M545 Low back pain, unspecified: Secondary | ICD-10-CM

## 2014-07-22 DIAGNOSIS — R109 Unspecified abdominal pain: Secondary | ICD-10-CM | POA: Insufficient documentation

## 2014-07-22 DIAGNOSIS — Z720 Tobacco use: Secondary | ICD-10-CM | POA: Insufficient documentation

## 2014-07-22 LAB — URINALYSIS, ROUTINE W REFLEX MICROSCOPIC
Bilirubin Urine: NEGATIVE
GLUCOSE, UA: NEGATIVE mg/dL
Hgb urine dipstick: NEGATIVE
Ketones, ur: NEGATIVE mg/dL
Leukocytes, UA: NEGATIVE
Nitrite: POSITIVE — AB
PH: 6 (ref 5.0–8.0)
Protein, ur: NEGATIVE mg/dL
SPECIFIC GRAVITY, URINE: 1.01 (ref 1.005–1.030)
Urobilinogen, UA: 0.2 mg/dL (ref 0.0–1.0)

## 2014-07-22 LAB — URINE MICROSCOPIC-ADD ON

## 2014-07-22 LAB — CBG MONITORING, ED: Glucose-Capillary: 158 mg/dL — ABNORMAL HIGH (ref 70–99)

## 2014-07-22 MED ORDER — OXYCODONE-ACETAMINOPHEN 5-325 MG PO TABS: 1.0000 | ORAL_TABLET | Freq: Four times a day (QID) | ORAL | Status: DC | PRN

## 2014-07-22 MED ORDER — HYDROMORPHONE HCL 1 MG/ML IJ SOLN
1.0000 mg | Freq: Once | INTRAMUSCULAR | Status: AC
  Administered 2014-07-22: 1 mg via INTRAVENOUS
  Filled 2014-07-22: qty 1

## 2014-07-22 MED ORDER — HYDROMORPHONE HCL 1 MG/ML IJ SOLN
0.5000 mg | Freq: Once | INTRAMUSCULAR | Status: AC
  Administered 2014-07-22: 0.5 mg via INTRAVENOUS
  Filled 2014-07-22: qty 1

## 2014-07-22 MED ORDER — CIPROFLOXACIN HCL 500 MG PO TABS: 500.0000 mg | ORAL_TABLET | Freq: Two times a day (BID) | ORAL | Status: DC

## 2014-07-22 MED ORDER — CIPROFLOXACIN HCL 250 MG PO TABS
500.0000 mg | ORAL_TABLET | Freq: Once | ORAL | Status: AC
  Administered 2014-07-22: 500 mg via ORAL
  Filled 2014-07-22: qty 2

## 2014-07-22 NOTE — ED Notes (Signed)
Pain rt lower back and down rt leg. Cough,  Dx with sciatica ,  Says she supposed to go to a pain clinic tomorrow, but appt was changed to  Monday. And needs pain med for now.  Also points to lt flank as site of pain.

## 2014-07-22 NOTE — ED Notes (Signed)
Pt alert & oriented x4, stable gait. Patient given discharge instructions, paperwork & prescription(s). Patient  instructed to stop at the registration desk to finish any additional paperwork. Patient verbalized understanding. Pt left department w/ no further questions. 

## 2014-07-22 NOTE — ED Provider Notes (Signed)
CSN: [id number]     Arrival date & time 07/22/14  1747 History  This chart was scribed for Benny LennertJoseph L Rhyan Radler, MD by Richarda Overlieichard Holland, ED Scribe. This patient was seen in room APA09/APA09 and the patient's care was started 6:23 PM.    Chief Complaint  Patient presents with  . Back Pain   Patient is a 53 y.o. female presenting with back pain. The history is provided by the patient. No language interpreter was used.  Back Pain Location:  Lumbar spine Radiates to:  R posterior upper leg Pain severity:  Mild Pain is:  Unable to specify Timing:  Intermittent Progression:  Worsening Chronicity:  Recurrent Associated symptoms: no abdominal pain, no chest pain and no headaches    HPI Comments: Melissa Huynh is a 53 y.o. female with a history of degenerative disc disease and DM who presents to the Emergency Department complaining of intermittent right lower back pain that radiates down her right leg. Per nursing, pt has a dx of sciatica, she was supposed to go to pain clinic in [place] tomorrow for pain medication but her appointment was rescheduled for 4 days from today. She reports associated cough and says she has some pain when she urinates. She says she normally takes ibuprofen 800mg  for her pain.    Past Medical History  Diagnosis Date  . Hypertension   . High cholesterol   . Anxiety   . Diabetes mellitus without complication    Past Surgical History  Procedure Laterality Date  . Cholecystectomy    . Tonsillectomy    . Cesarean section    . Tubal ligation     History reviewed. No pertinent family history. History  Substance Use Topics  . Smoking status: Current Every Day Smoker -- 0.50 packs/day    Types: Cigarettes  . Smokeless tobacco: Not on file  . Alcohol Use: No   OB History   Grav Para Term Preterm Abortions TAB SAB Ect Mult Living                 Review of Systems  Constitutional: Negative for appetite change and fatigue.  HENT: Negative for congestion, ear  discharge and sinus pressure.   Eyes: Negative for discharge.  Respiratory: Positive for cough.   Cardiovascular: Negative for chest pain.  Gastrointestinal: Negative for abdominal pain and diarrhea.  Genitourinary: Positive for flank pain. Negative for frequency and hematuria.  Musculoskeletal: Positive for back pain.  Skin: Negative for rash.  Neurological: Negative for seizures and headaches.  Psychiatric/Behavioral: Negative for hallucinations.    Allergies  Cheese; Flexeril; Penicillins; Toradol; and Tramadol  Home Medications   Prior to Admission medications   Medication Sig Start Date End Date Taking? Authorizing Provider  acetaminophen (TYLENOL) 500 MG tablet Take 1 tablet (500 mg total) by mouth every 6 (six) hours as needed. 02/09/14   Kaitlyn Szekalski, PA-C  clonazePAM (KLONOPIN) 0.5 MG tablet Take 0.5 mg by mouth 3 (three) times daily as needed for anxiety.    Historical Provider, MD  diazepam (VALIUM) 5 MG tablet Take 1 tablet (5 mg total) by mouth every 8 (eight) hours as needed for muscle spasms. 06/24/14   Kristen N Ward, DO  ibuprofen (ADVIL,MOTRIN) 200 MG tablet Take 800 mg by mouth every 6 (six) hours as needed for moderate pain.     Historical Provider, MD  ibuprofen (ADVIL,MOTRIN) 800 MG tablet Take 1 tablet (800 mg total) by mouth every 8 (eight) hours as needed for mild pain. 06/24/14  Kristen N Ward, DO  lisinopril-hydrochlorothiazide (PRINZIDE,ZESTORETIC) 20-12.5 MG per tablet Take 1 tablet by mouth 2 (two) times daily.    Historical Provider, MD  metFORMIN (GLUCOPHAGE) 500 MG tablet Take 500 mg by mouth 2 (two) times daily with a meal.    Historical Provider, MD  metoprolol tartrate (LOPRESSOR) 25 MG tablet Take 25 mg by mouth 2 (two) times daily.    Historical Provider, MD  oxyCODONE-acetaminophen (PERCOCET/ROXICET) 5-325 MG per tablet Take 1 tablet by mouth every 4 (four) hours as needed. 06/24/14   Kristen N Ward, DO   BP 139/82  Pulse 96  Temp(Src) 98.8 F  (37.1 C) (Oral)  Resp 18  Ht 6' (1.829 m)  Wt 202 lb (91.627 kg)  BMI 27.39 kg/m2  SpO2 96%  LMP 01/18/2014 Physical Exam  Nursing note and vitals reviewed. Constitutional: She is oriented to person, place, and time. She appears well-developed.  HENT:  Head: Normocephalic.  Eyes: Conjunctivae and EOM are normal. No scleral icterus.  Neck: Neck supple. No thyromegaly present.  Cardiovascular: Normal rate and regular rhythm.  Exam reveals no gallop and no friction rub.   No murmur heard. Pulmonary/Chest: No stridor. She has no wheezes. She has no rales. She exhibits no tenderness.  Abdominal: She exhibits no distension. There is no tenderness. There is no rebound.  Musculoskeletal: Normal range of motion. She exhibits no edema.  Tenderness to her lumbar spine with positive straight leg rasing to her right   Lymphadenopathy:    She has no cervical adenopathy.  Neurological: She is oriented to person, place, and time. She exhibits normal muscle tone. Coordination normal.  Skin: No rash noted. No erythema.  Psychiatric: She has a normal mood and affect. Her behavior is normal.    ED Course  Procedures   DIAGNOSTIC STUDIES: Oxygen Saturation is 96% on RA, normal by my interpretation.    COORDINATION OF CARE: 6:28 PM Discussed treatment plan with pt at bedside and pt agreed to plan.   Labs Review Labs Reviewed  URINALYSIS, ROUTINE W REFLEX MICROSCOPIC - Abnormal; Notable for the following:    Nitrite POSITIVE (*)    All other components within normal limits  URINE MICROSCOPIC-ADD ON - Abnormal; Notable for the following:    Squamous Epithelial / LPF MANY (*)    Bacteria, UA MANY (*)    All other components within normal limits  CBG MONITORING, ED - Abnormal; Notable for the following:    Glucose-Capillary 158 (*)    All other components within normal limits    Imaging Review No results found.   EKG Interpretation None      MDM   Final diagnoses:  None    The  chart was scribed for me under my direct supervision.  I personally performed the history, physical, and medical decision making and all procedures in the evaluation of this patient.Benny Lennert.      Bentzion Dauria L Hiliary Osorto, MD 07/22/14 [phone]

## 2014-07-22 NOTE — Discharge Instructions (Signed)
Follow up Monday as planned.

## 2014-07-25 LAB — URINE CULTURE
Colony Count: >=100000
Special Requests: NORMAL

## 2014-07-26 ENCOUNTER — Telehealth (HOSPITAL_COMMUNITY): Payer: Self-pay

## 2014-07-26 NOTE — ED Notes (Signed)
Post ED Visit - Positive Culture Follow-up  Culture report reviewed by antimicrobial stewardship pharmacist: [x]  Wes Dulaney, Pharm.D., BCPS []  Celedonio MiyamotoJeremy Frens, Pharm.D., BCPS []  Georgina PillionElizabeth Martin, Pharm.D., BCPS []  WestsideMinh Pham, [address]D., BCPS, AAHIVP []  Estella HuskMichelle Turner, Pharm.D., BCPS, AAHIVP []  Carly Sabat, Pharm.D. []  Enzo BiNathan Batchelder, [address]D.  Positive urine culture Treated with cipro, organism sensitive to the same and no further patient follow-up is required at this time.  Ashley JacobsFesterman, Warrene Kapfer C 07/26/2014, 11:55 AM

## 2014-08-02 ENCOUNTER — Emergency Department (HOSPITAL_COMMUNITY)
Admission: EM | Admit: 2014-08-02 | Discharge: 2014-08-02 | Disposition: A | Discharge status: Home / Self Care | Payer: Self-pay | Attending: Emergency Medicine | Admitting: Emergency Medicine

## 2014-08-02 ENCOUNTER — Encounter (HOSPITAL_COMMUNITY): Payer: Self-pay | Admitting: *Deleted

## 2014-08-02 DIAGNOSIS — M5431 Sciatica, right side: Secondary | ICD-10-CM | POA: Insufficient documentation

## 2014-08-02 DIAGNOSIS — Y998 Other external cause status: Secondary | ICD-10-CM | POA: Insufficient documentation

## 2014-08-02 DIAGNOSIS — X58XXXA Exposure to other specified factors, initial encounter: Secondary | ICD-10-CM | POA: Insufficient documentation

## 2014-08-02 DIAGNOSIS — Z79899 Other long term (current) drug therapy: Secondary | ICD-10-CM | POA: Insufficient documentation

## 2014-08-02 DIAGNOSIS — Z880 Allergy status to penicillin: Secondary | ICD-10-CM | POA: Insufficient documentation

## 2014-08-02 DIAGNOSIS — S3992XA Unspecified injury of lower back, initial encounter: Secondary | ICD-10-CM | POA: Insufficient documentation

## 2014-08-02 DIAGNOSIS — Y9389 Activity, other specified: Secondary | ICD-10-CM | POA: Insufficient documentation

## 2014-08-02 DIAGNOSIS — F419 Anxiety disorder, unspecified: Secondary | ICD-10-CM | POA: Insufficient documentation

## 2014-08-02 DIAGNOSIS — S8991XA Unspecified injury of right lower leg, initial encounter: Secondary | ICD-10-CM | POA: Insufficient documentation

## 2014-08-02 DIAGNOSIS — M5441 Lumbago with sciatica, right side: Secondary | ICD-10-CM

## 2014-08-02 DIAGNOSIS — Z792 Long term (current) use of antibiotics: Secondary | ICD-10-CM | POA: Insufficient documentation

## 2014-08-02 DIAGNOSIS — Y9289 Other specified places as the place of occurrence of the external cause: Secondary | ICD-10-CM | POA: Insufficient documentation

## 2014-08-02 DIAGNOSIS — M5136 Other intervertebral disc degeneration, lumbar region: Secondary | ICD-10-CM | POA: Insufficient documentation

## 2014-08-02 DIAGNOSIS — Z720 Tobacco use: Secondary | ICD-10-CM | POA: Insufficient documentation

## 2014-08-02 DIAGNOSIS — E119 Type 2 diabetes mellitus without complications: Secondary | ICD-10-CM | POA: Insufficient documentation

## 2014-08-02 DIAGNOSIS — I10 Essential (primary) hypertension: Secondary | ICD-10-CM | POA: Insufficient documentation

## 2014-08-02 LAB — CBG MONITORING, ED: Glucose-Capillary: 145 mg/dL — ABNORMAL HIGH (ref 70–99)

## 2014-08-02 MED ORDER — METHOCARBAMOL 500 MG PO TABS
1000.0000 mg | ORAL_TABLET | Freq: Once | ORAL | Status: AC
  Administered 2014-08-02: 1000 mg via ORAL
  Filled 2014-08-02: qty 2

## 2014-08-02 MED ORDER — METHOCARBAMOL 500 MG PO TABS: 500.0000 mg | ORAL_TABLET | Freq: Three times a day (TID) | ORAL | Status: DC

## 2014-08-02 MED ORDER — HYDROCODONE-ACETAMINOPHEN 5-325 MG PO TABS: 1.0000 | ORAL_TABLET | ORAL | Status: DC | PRN

## 2014-08-02 MED ORDER — HYDROCODONE-ACETAMINOPHEN 5-325 MG PO TABS
2.0000 | ORAL_TABLET | Freq: Once | ORAL | Status: AC
  Administered 2014-08-02: 2 via ORAL
  Filled 2014-08-02: qty 2

## 2014-08-02 MED ORDER — HYDROXYZINE HCL 25 MG PO TABS
25.0000 mg | ORAL_TABLET | Freq: Once | ORAL | Status: AC
  Administered 2014-08-02: 25 mg via ORAL
  Filled 2014-08-02: qty 1

## 2014-08-02 NOTE — ED Notes (Signed)
Low back pain , rt knee pain.

## 2014-08-02 NOTE — ED Provider Notes (Signed)
CSN: [id number]     Arrival date & time 08/02/14  1431 History  This chart was scribed for non-physician practitioner Ivery QualeHobson Fardeen Steinberger, PA-C, working with Hurman HornJohn M Bednar, MD by Littie Deedsichard Sun, ED Scribe. This patient was seen in room APFT24/APFT24 and the patient's care was started at 4:59 PM.    Chief Complaint  Patient presents with  . Back Pain    The history is provided by the patient. No language interpreter was used.   HPI Comments: Melissa Huynh is a 53 y.o. female with a hx of L4-L5 in her spine, DDD and sciatica who presents to the Emergency Department complaining of low back pain that occurred last night when she heard a pop. She also notes stabbing, right knee pain with swelling. Patient tried Ibuprofen but without relief. Patient has been helping her 285 lb sister move around after her sister injured her foot; patient thinks helping her move around has exacerbated her pain. Patient was referred to Dr. Laurian Brim'Toole, pain specialist, by her cardiologist. Dr. Laurian Brim'Toole was willing to take her in, but patient is unable to afford all the testing and injections.  She denies falls. Patient is not receiving any pain medications from any of the specialists that she sees. She has allergies to Penicillins, Toradol, Tramadol and cheese products.   Past Medical History  Diagnosis Date  . Hypertension   . High cholesterol   . Anxiety   . Diabetes mellitus without complication    Past Surgical History  Procedure Laterality Date  . Cholecystectomy    . Tonsillectomy    . Cesarean section    . Tubal ligation     History reviewed. No pertinent family history. History  Substance Use Topics  . Smoking status: Current Every Day Smoker -- 0.50 packs/day    Types: Cigarettes  . Smokeless tobacco: Not on file  . Alcohol Use: No   OB History    No data available     Review of Systems  Musculoskeletal: Positive for back pain and arthralgias.      Allergies  Cheese; Flexeril; Penicillins; Toradol;  and Tramadol  Home Medications   Prior to Admission medications   Medication Sig Start Date End Date Taking? Authorizing Provider  ciprofloxacin (CIPRO) 500 MG tablet Take 1 tablet (500 mg total) by mouth 2 (two) times daily. One po bid x 7 days 07/22/14   Benny LennertJoseph L Zammit, MD  clonazePAM (KLONOPIN) 0.5 MG tablet Take 0.5 mg by mouth 3 (three) times daily as needed for anxiety.    Historical Provider, MD  ibuprofen (ADVIL,MOTRIN) 800 MG tablet Take 1 tablet (800 mg total) by mouth every 8 (eight) hours as needed for mild pain. 06/24/14   Kristen N Ward, DO  lisinopril-hydrochlorothiazide (PRINZIDE,ZESTORETIC) 20-12.5 MG per tablet Take 1 tablet by mouth 2 (two) times daily.    Historical Provider, MD  metFORMIN (GLUCOPHAGE) 500 MG tablet Take 500 mg by mouth 2 (two) times daily with a meal.    Historical Provider, MD  metoprolol tartrate (LOPRESSOR) 25 MG tablet Take 25 mg by mouth 2 (two) times daily.    Historical Provider, MD  oxyCODONE-acetaminophen (PERCOCET/ROXICET) 5-325 MG per tablet Take 1 tablet by mouth every 6 (six) hours as needed. 07/22/14   Benny LennertJoseph L Zammit, MD  oxyCODONE-acetaminophen (PERCOCET/ROXICET) 5-325 MG per tablet Take 1 tablet by mouth every 6 (six) hours as needed. 07/22/14   Benny LennertJoseph L Zammit, MD  oxyCODONE-acetaminophen (PERCOCET/ROXICET) 5-325 MG per tablet Take 1 tablet by mouth every 6 (six)  hours as needed. 07/22/14   Benny LennertJoseph L Zammit, MD  oxyCODONE-acetaminophen (PERCOCET/ROXICET) 5-325 MG per tablet Take 1 tablet by mouth every 6 (six) hours as needed. 07/22/14   Benny LennertJoseph L Zammit, MD   BP 145/85 mmHg  Pulse 80  Temp(Src) 98 F (36.7 C) (Oral)  Resp 18  Ht 6' (1.829 m)  Wt 202 lb (91.627 kg)  BMI 27.39 kg/m2  SpO2 100%  LMP 01/18/2014 Physical Exam  Constitutional: She is oriented to person, place, and time. She appears well-developed and well-nourished. No distress.  HENT:  Head: Normocephalic and atraumatic.  Mouth/Throat: Oropharynx is clear and moist. No  oropharyngeal exudate.  Eyes: Pupils are equal, round, and reactive to light.  Neck: Neck supple. Carotid bruit is not present.  Cardiovascular: Normal rate, regular rhythm and normal heart sounds.   No murmur heard. Pulmonary/Chest: Effort normal.  Symmetrical rise and fall of chest. No focal consolidation.  Musculoskeletal: She exhibits no edema.  Neurological: She is alert and oriented to person, place, and time. No cranial nerve deficit.  No motor strength deficits. No sensory deficits. No motor deficits of the upper extremities.  Skin: Skin is warm and dry. No rash noted.  Psychiatric: She has a normal mood and affect. Her behavior is normal.  Nursing note and vitals reviewed.   ED Course  Procedures  DIAGNOSTIC STUDIES: Oxygen Saturation is 100% on room air, normal by my interpretation.    COORDINATION OF CARE: 5:10 PM-Discussed treatment plan which includes medications with pt at bedside and pt agreed to plan.    Labs Review Labs Reviewed  CBG MONITORING, ED - Abnormal; Notable for the following:    Glucose-Capillary 145 (*)    All other components within normal limits    Imaging Review No results found.   EKG Interpretation None      MDM  Exam is negative for acute neuro deficit. Vital signs are within normal limits. Plan - Rx for norco and robaxin. Pt to apply heat to the lower back.  Pt states the MD helping her with control of DM and hypertension will not give pain meds. She can not afford to see the pain specialist due to finances. She is waiting for SS to assist with disability.   Final diagnoses:  Right-sided low back pain with right-sided sciatica  DDD (degenerative disc disease), lumbar    **I have reviewed nursing notes, vital signs, and all appropriate lab and imaging results for this patient.*  **I personally performed the services described in this documentation, which was scribed in my presence. The recorded information has been reviewed and  is accurate.Kathie Dike*   Bexlee Bergdoll M Tashae Inda, PA-C 08/02/14 1720  Hurman HornJohn M Bednar, MD 08/19/14 [phone]

## 2014-08-02 NOTE — Discharge Instructions (Signed)
Back Pain, Adult °Back pain is very common. The pain often gets better over time. The cause of back pain is usually not dangerous. Most people can learn to manage their back pain on their own.  °HOME CARE  °· Stay active. Start with short walks on flat ground if you can. Try to walk farther each day. °· Do not sit, drive, or stand in one place for more than 30 minutes. Do not stay in bed. °· Do not avoid exercise or work. Activity can help your back heal faster. °· Be careful when you bend or lift an object. Bend at your knees, keep the object close to you, and do not twist. °· Sleep on a firm mattress. Lie on your side, and bend your knees. If you lie on your back, put a pillow under your knees. °· Only take medicines as told by your doctor. °· Put ice on the injured area. °¨ Put ice in a plastic bag. °¨ Place a towel between your skin and the bag. °¨ Leave the ice on for 15-20 minutes, 03-04 times a day for the first 2 to 3 days. After that, you can switch between ice and heat packs. °· Ask your doctor about back exercises or massage. °· Avoid feeling anxious or stressed. Find good ways to deal with stress, such as exercise. °GET HELP RIGHT AWAY IF:  °· Your pain does not go away with rest or medicine. °· Your pain does not go away in 1 week. °· You have new problems. °· You do not feel well. °· The pain spreads into your legs. °· You cannot control when you poop (bowel movement) or pee (urinate). °· Your arms or legs feel weak or lose feeling (numbness). °· You feel sick to your stomach (nauseous) or throw up (vomit). °· You have belly (abdominal) pain. °· You feel like you may pass out (faint). °MAKE SURE YOU:  °· Understand these instructions. °· Will watch your condition. °· Will get help right away if you are not doing well or get worse. °Document Released: 02/27/2008 Document Revised: 12/03/2011 Document Reviewed: 01/12/2014 °[employer]® Patient Information ©2015 [employer]. This information is not intended  to replace advice given to you by your health care provider. Make sure you discuss any questions you have with your health care provider. ° °

## 2014-08-09 ENCOUNTER — Emergency Department (HOSPITAL_COMMUNITY): Payer: Self-pay

## 2014-08-09 ENCOUNTER — Encounter (HOSPITAL_COMMUNITY): Payer: Self-pay | Admitting: Emergency Medicine

## 2014-08-09 ENCOUNTER — Emergency Department (HOSPITAL_COMMUNITY)
Admission: EM | Admit: 2014-08-09 | Discharge: 2014-08-10 | Disposition: A | Discharge status: Home / Self Care | Payer: Self-pay | Attending: Emergency Medicine | Admitting: Emergency Medicine

## 2014-08-09 DIAGNOSIS — M549 Dorsalgia, unspecified: Secondary | ICD-10-CM

## 2014-08-09 DIAGNOSIS — W01198A Fall on same level from slipping, tripping and stumbling with subsequent striking against other object, initial encounter: Secondary | ICD-10-CM | POA: Insufficient documentation

## 2014-08-09 DIAGNOSIS — G8929 Other chronic pain: Secondary | ICD-10-CM | POA: Insufficient documentation

## 2014-08-09 DIAGNOSIS — Y998 Other external cause status: Secondary | ICD-10-CM | POA: Insufficient documentation

## 2014-08-09 DIAGNOSIS — Z880 Allergy status to penicillin: Secondary | ICD-10-CM | POA: Insufficient documentation

## 2014-08-09 DIAGNOSIS — E119 Type 2 diabetes mellitus without complications: Secondary | ICD-10-CM | POA: Insufficient documentation

## 2014-08-09 DIAGNOSIS — Z8659 Personal history of other mental and behavioral disorders: Secondary | ICD-10-CM | POA: Insufficient documentation

## 2014-08-09 DIAGNOSIS — I10 Essential (primary) hypertension: Secondary | ICD-10-CM | POA: Insufficient documentation

## 2014-08-09 DIAGNOSIS — Z79899 Other long term (current) drug therapy: Secondary | ICD-10-CM | POA: Insufficient documentation

## 2014-08-09 DIAGNOSIS — R197 Diarrhea, unspecified: Secondary | ICD-10-CM | POA: Insufficient documentation

## 2014-08-09 DIAGNOSIS — Y9389 Activity, other specified: Secondary | ICD-10-CM | POA: Insufficient documentation

## 2014-08-09 DIAGNOSIS — S060X0A Concussion without loss of consciousness, initial encounter: Secondary | ICD-10-CM | POA: Insufficient documentation

## 2014-08-09 DIAGNOSIS — Y9289 Other specified places as the place of occurrence of the external cause: Secondary | ICD-10-CM | POA: Insufficient documentation

## 2014-08-09 DIAGNOSIS — S3991XA Unspecified injury of abdomen, initial encounter: Secondary | ICD-10-CM | POA: Insufficient documentation

## 2014-08-09 DIAGNOSIS — S3992XA Unspecified injury of lower back, initial encounter: Secondary | ICD-10-CM | POA: Insufficient documentation

## 2014-08-09 DIAGNOSIS — W19XXXA Unspecified fall, initial encounter: Secondary | ICD-10-CM

## 2014-08-09 LAB — COMPREHENSIVE METABOLIC PANEL
ALK PHOS: 126 U/L — AB (ref 39–117)
ALT: 30 U/L (ref 0–35)
AST: 28 U/L (ref 0–37)
Albumin: 3.6 g/dL (ref 3.5–5.2)
Anion gap: 11 (ref 5–15)
BILIRUBIN TOTAL: 0.4 mg/dL (ref 0.3–1.2)
BUN: 13 mg/dL (ref 6–23)
CHLORIDE: 102 meq/L (ref 96–112)
CO2: 27 mEq/L (ref 19–32)
Calcium: 9.2 mg/dL (ref 8.4–10.5)
Creatinine, Ser: 0.82 mg/dL (ref 0.50–1.10)
GFR calc non Af Amer: 81 mL/min — ABNORMAL LOW (ref >90)
GLUCOSE: 101 mg/dL — AB (ref 70–99)
POTASSIUM: 3.3 meq/L — AB (ref 3.7–5.3)
SODIUM: 140 meq/L (ref 137–147)
Total Protein: 7.3 g/dL (ref 6.0–8.3)

## 2014-08-09 LAB — CBC WITH DIFFERENTIAL/PLATELET
BASOS PCT: 0 % (ref 0–1)
Basophils Absolute: 0 10*3/uL (ref 0.0–0.1)
EOS ABS: 0.2 10*3/uL (ref 0.0–0.7)
Eosinophils Relative: 2 % (ref 0–5)
HCT: 41.3 % (ref 36.0–46.0)
Hemoglobin: 14.5 g/dL (ref 12.0–15.0)
Lymphocytes Relative: 40 % (ref 12–46)
Lymphs Abs: 5.1 10*3/uL — ABNORMAL HIGH (ref 0.7–4.0)
MCH: 30.5 pg (ref 26.0–34.0)
MCHC: 35.1 g/dL (ref 30.0–36.0)
MCV: 86.9 fL (ref 78.0–100.0)
MONO ABS: 0.9 10*3/uL (ref 0.1–1.0)
Monocytes Relative: 7 % (ref 3–12)
NEUTROS PCT: 51 % (ref 43–77)
Neutro Abs: 6.4 10*3/uL (ref 1.7–7.7)
Platelets: 304 10*3/uL (ref 150–400)
RBC: 4.75 MIL/uL (ref 3.87–5.11)
RDW: 13.5 % (ref 11.5–15.5)
WBC: 12.6 10*3/uL — ABNORMAL HIGH (ref 4.0–10.5)

## 2014-08-09 LAB — URINALYSIS, ROUTINE W REFLEX MICROSCOPIC
Bilirubin Urine: NEGATIVE
Glucose, UA: NEGATIVE mg/dL
HGB URINE DIPSTICK: NEGATIVE
Ketones, ur: NEGATIVE mg/dL
Leukocytes, UA: NEGATIVE
Nitrite: NEGATIVE
PROTEIN: NEGATIVE mg/dL
Specific Gravity, Urine: 1.025 (ref 1.005–1.030)
UROBILINOGEN UA: 0.2 mg/dL (ref 0.0–1.0)
pH: 5.5 (ref 5.0–8.0)

## 2014-08-09 MED ORDER — SODIUM CHLORIDE 0.9 % IV BOLUS (SEPSIS)
500.0000 mL | Freq: Once | INTRAVENOUS | Status: AC
  Administered 2014-08-09: 500 mL via INTRAVENOUS

## 2014-08-09 MED ORDER — MORPHINE SULFATE 4 MG/ML IJ SOLN
4.0000 mg | Freq: Once | INTRAMUSCULAR | Status: AC
  Administered 2014-08-09: 4 mg via INTRAVENOUS
  Filled 2014-08-09: qty 1

## 2014-08-09 NOTE — ED Notes (Signed)
Pt ambulated independently to bathroom and back to room.  

## 2014-08-09 NOTE — ED Notes (Signed)
Pt been have diarrhea, hx of back pain getting up fast today, states leg gave out on her today, fell today and hit head on kitchen table, complaining of headache

## 2014-08-09 NOTE — ED Provider Notes (Addendum)
CSN: [id number]     Arrival date & time 08/09/14  1752 History  This chart was scribed for American Expressathan R. Rubin PayorPickering, MD by Gwenyth Oberatherine Macek, ED Scribe. This patient was seen in room [facility]04/[facility]04 and the patient's care was started at 9:30 PM.   Chief Complaint  Patient presents with  . Fall  . Diarrhea   Patient is a 53 y.o. female presenting with diarrhea. The history is provided by the patient. No language interpreter was used.  Diarrhea Associated symptoms: headaches     HPI Comments: Melissa Huynh is a 53 y.o. female with a history of chronic back pain and degenerative disk disease who presents to the Emergency Department complaining of a head injury that occurred earlier today after she fell. She notes confusion as an associated symptom. Pt states she was getting up from a chair when her legs gave out. She hit her head on the kitchen table, but did not lose consciousness. Pt also complains of diarrhea that started 15 hours ago. She states increased thirst as an associated symptom. Pt denies positive sick contact. She denies nausea as an associated symptom.  Past Medical History  Diagnosis Date  . Hypertension   . High cholesterol   . Anxiety   . Diabetes mellitus without complication    Past Surgical History  Procedure Laterality Date  . Cholecystectomy    . Tonsillectomy    . Cesarean section    . Tubal ligation     No family history on file. History  Substance Use Topics  . Smoking status: Current Every Day Smoker -- 0.50 packs/day    Types: Cigarettes  . Smokeless tobacco: Not on file  . Alcohol Use: No   OB History    No data available     Review of Systems  Gastrointestinal: Positive for diarrhea. Negative for nausea.  Musculoskeletal: Positive for back pain.  Neurological: Positive for headaches.  All other systems reviewed and are negative.  Allergies  Cheese; Flexeril; Penicillins; Toradol; and Tramadol  Home Medications   Prior to Admission medications    Medication Sig Start Date End Date Taking? Authorizing Provider  ibuprofen (ADVIL,MOTRIN) 800 MG tablet Take 1 tablet (800 mg total) by mouth every 8 (eight) hours as needed for mild pain. 06/24/14  Yes Kristen N Ward, DO  lisinopril-hydrochlorothiazide (PRINZIDE,ZESTORETIC) 20-12.5 MG per tablet Take 1 tablet by mouth 2 (two) times daily.   Yes Historical Provider, MD  metFORMIN (GLUCOPHAGE) 500 MG tablet Take 500 mg by mouth 2 (two) times daily with a meal.   Yes Historical Provider, MD  methocarbamol (ROBAXIN) 500 MG tablet Take 1 tablet (500 mg total) by mouth 3 (three) times daily. 08/02/14  Yes Kathie DikeHobson M Bryant, PA-C  metoprolol tartrate (LOPRESSOR) 25 MG tablet Take 25 mg by mouth 2 (two) times daily.   Yes Historical Provider, MD  ciprofloxacin (CIPRO) 500 MG tablet Take 1 tablet (500 mg total) by mouth 2 (two) times daily. One po bid x 7 days Patient not taking: Reported on 08/09/2014 07/22/14   Benny LennertJoseph L Zammit, MD  HYDROcodone-acetaminophen (NORCO/VICODIN) 5-325 MG per tablet Take 1 tablet by mouth every 4 (four) hours as needed. Patient not taking: Reported on 08/09/2014 08/02/14   Kathie DikeHobson M Bryant, PA-C  oxyCODONE-acetaminophen (PERCOCET/ROXICET) 5-325 MG per tablet Take 1 tablet by mouth every 6 (six) hours as needed. Patient not taking: Reported on 08/09/2014 07/22/14   Benny LennertJoseph L Zammit, MD  oxyCODONE-acetaminophen (PERCOCET/ROXICET) 5-325 MG per tablet Take 1 tablet by mouth every  6 (six) hours as needed. Patient not taking: Reported on 08/09/2014 07/22/14   Benny Lennert, MD  oxyCODONE-acetaminophen (PERCOCET/ROXICET) 5-325 MG per tablet Take 1 tablet by mouth every 6 (six) hours as needed. Patient not taking: Reported on 08/09/2014 07/22/14   Benny Lennert, MD  oxyCODONE-acetaminophen (PERCOCET/ROXICET) 5-325 MG per tablet Take 1 tablet by mouth every 6 (six) hours as needed. Patient not taking: Reported on 08/09/2014 07/22/14   Benny Lennert, MD   BP 128/90 mmHg  Pulse 74   Temp(Src) 97.6 F (36.4 C) (Oral)  Resp 17  Ht 6' (1.829 m)  Wt 202 lb (91.627 kg)  BMI 27.39 kg/m2  SpO2 96%  LMP 01/18/2014 Physical Exam  Constitutional: She appears well-developed and well-nourished. No distress.     HENT:  Head: Normocephalic and atraumatic.  Tongue dry  Eyes: Conjunctivae and EOM are normal. Pupils are equal, round, and reactive to light.  Neck: Neck supple. No tracheal deviation present.  Cardiovascular: Normal rate, regular rhythm and normal heart sounds.   Pulmonary/Chest: Effort normal. No respiratory distress.  Abdominal: There is tenderness.  Mild diffuse abdominal tenderness  Musculoskeletal: She exhibits tenderness.  No cervical tenderness Sensation intact in both feet Mild lumbar spine tenderness  Skin: Skin is warm and dry.  Psychiatric: She has a normal mood and affect. Her behavior is normal.  Nursing note and vitals reviewed.   ED Course  Procedures (including critical care time)  COORDINATION OF CARE: 9:35 PM Discussed treatment plan with pt at bedside and pt agreed to plan.  Results for orders placed or performed during the hospital encounter of 08/09/14  Comprehensive metabolic panel  Result Value Ref Range   Sodium 140 137 - 147 mEq/L   Potassium 3.3 (L) 3.7 - 5.3 mEq/L   Chloride 102 96 - 112 mEq/L   CO2 27 19 - 32 mEq/L   Glucose, Bld 101 (H) 70 - 99 mg/dL   BUN 13 6 - 23 mg/dL   Creatinine, Ser 9.60 0.50 - 1.10 mg/dL   Calcium 9.2 8.4 - 45.4 mg/dL   Total Protein 7.3 6.0 - 8.3 g/dL   Albumin 3.6 3.5 - 5.2 g/dL   AST 28 0 - 37 U/L   ALT 30 0 - 35 U/L   Alkaline Phosphatase 126 (H) 39 - 117 U/L   Total Bilirubin 0.4 0.3 - 1.2 mg/dL   GFR calc non Af Amer 81 (L) >90 mL/min   GFR calc Af Amer >90 >90 mL/min   Anion gap 11 5 - 15  CBC with Differential  Result Value Ref Range   WBC 12.6 (H) 4.0 - 10.5 K/uL   RBC 4.75 3.87 - 5.11 MIL/uL   Hemoglobin 14.5 12.0 - 15.0 g/dL   HCT 09.8 11.9 - 14.7 %   MCV 86.9 78.0 - 100.0  fL   MCH 30.5 26.0 - 34.0 pg   MCHC 35.1 30.0 - 36.0 g/dL   RDW 82.9 56.2 - 13.0 %   Platelets 304 150 - 400 K/uL   Neutrophils Relative % 51 43 - 77 %   Neutro Abs 6.4 1.7 - 7.7 K/uL   Lymphocytes Relative 40 12 - 46 %   Lymphs Abs 5.1 (H) 0.7 - 4.0 K/uL   Monocytes Relative 7 3 - 12 %   Monocytes Absolute 0.9 0.1 - 1.0 K/uL   Eosinophils Relative 2 0 - 5 %   Eosinophils Absolute 0.2 0.0 - 0.7 K/uL   Basophils Relative 0 0 -  1 %   Basophils Absolute 0.0 0.0 - 0.1 K/uL  Urinalysis, Routine w reflex microscopic  Result Value Ref Range   Color, Urine YELLOW YELLOW   APPearance CLEAR CLEAR   Specific Gravity, Urine 1.025 1.005 - 1.030   pH 5.5 5.0 - 8.0   Glucose, UA NEGATIVE NEGATIVE mg/dL   Hgb urine dipstick NEGATIVE NEGATIVE   Bilirubin Urine NEGATIVE NEGATIVE   Ketones, ur NEGATIVE NEGATIVE mg/dL   Protein, ur NEGATIVE NEGATIVE mg/dL   Urobilinogen, UA 0.2 0.0 - 1.0 mg/dL   Nitrite NEGATIVE NEGATIVE   Leukocytes, UA NEGATIVE NEGATIVE   Dg Lumbar Spine Complete  08/09/2014   CLINICAL DATA:  Low back pain radiating down both legs. Fall this morning, striking head on table and landing on back.  EXAM: LUMBAR SPINE - COMPLETE 4+ VIEW  COMPARISON:  06/23/2014  FINDINGS: Normal alignment of the lumbar spine and facet joints. Diffuse degenerative changes with narrowed lumbar interspaces and associated endplate hypertrophic changes throughout. Degenerative disc disease at L2-3. No vertebral compression deformities. Bone cortex and trabecular architecture appear intact.  IMPRESSION: Degenerative changes in the lumbar spine. No displaced fractures identified.   Electronically Signed   By: Burman Nieves M.D.   On: 08/09/2014 22:48   Ct Head Wo Contrast  08/09/2014   CLINICAL DATA:  Legs gave out when jumped up too fast, fell striking posterior head on kitchen table and landing on back, headache, confusion, personal history of diabetes mellitus, hypertension, smoking  EXAM: CT HEAD  WITHOUT CONTRAST  TECHNIQUE: Contiguous axial images were obtained from the base of the skull through the vertex without intravenous contrast.  COMPARISON:  09/17/2013  FINDINGS: Normal ventricular morphology.  No midline shift or mass effect.  Normal appearance of brain parenchyma.  No intracranial hemorrhage, mass lesion or evidence acute infarction.  No extra-axial fluid collections.  Small amount of fluid or mucus within sphenoid sinus.  Bones and sinuses otherwise unremarkable.  IMPRESSION: No acute intracranial abnormalities.   Electronically Signed   By: Ulyses Southward M.D.   On: 08/09/2014 22:52     Labs Review Labs Reviewed  COMPREHENSIVE METABOLIC PANEL - Abnormal; Notable for the following:    Potassium 3.3 (*)    Glucose, Bld 101 (*)    Alkaline Phosphatase 126 (*)    GFR calc non Af Amer 81 (*)    All other components within normal limits  CBC WITH DIFFERENTIAL - Abnormal; Notable for the following:    WBC 12.6 (*)    Lymphs Abs 5.1 (*)    All other components within normal limits  URINALYSIS, ROUTINE W REFLEX MICROSCOPIC    Imaging Review Dg Lumbar Spine Complete  08/09/2014   CLINICAL DATA:  Low back pain radiating down both legs. Fall this morning, striking head on table and landing on back.  EXAM: LUMBAR SPINE - COMPLETE 4+ VIEW  COMPARISON:  06/23/2014  FINDINGS: Normal alignment of the lumbar spine and facet joints. Diffuse degenerative changes with narrowed lumbar interspaces and associated endplate hypertrophic changes throughout. Degenerative disc disease at L2-3. No vertebral compression deformities. Bone cortex and trabecular architecture appear intact.  IMPRESSION: Degenerative changes in the lumbar spine. No displaced fractures identified.   Electronically Signed   By: Burman Nieves M.D.   On: 08/09/2014 22:48   Ct Head Wo Contrast  08/09/2014   CLINICAL DATA:  Legs gave out when jumped up too fast, fell striking posterior head on kitchen table and landing on back,  headache, confusion,  personal history of diabetes mellitus, hypertension, smoking  EXAM: CT HEAD WITHOUT CONTRAST  TECHNIQUE: Contiguous axial images were obtained from the base of the skull through the vertex without intravenous contrast.  COMPARISON:  09/17/2013  FINDINGS: Normal ventricular morphology.  No midline shift or mass effect.  Normal appearance of brain parenchyma.  No intracranial hemorrhage, mass lesion or evidence acute infarction.  No extra-axial fluid collections.  Small amount of fluid or mucus within sphenoid sinus.  Bones and sinuses otherwise unremarkable.  IMPRESSION: No acute intracranial abnormalities.   Electronically Signed   By: Ulyses SouthwardMark  Boles M.D.   On: 08/09/2014 22:52     EKG Interpretation None      MDM   Final diagnoses:  Fall  Concussion, without loss of consciousness, initial encounter  Chronic back pain  Diarrhea    Patient with fall. Head injury with concussion. Negative CT. Also acute on chronic back pain. Negative, or stable x-ray. Patient has had 8 visits in the last 6 months for chronic pain in her back and will be discharged home.  I personally performed the services described in this documentation, which was scribed in my presence. The recorded information has been reviewed and is accurate.     Melissa RudeNathan R. Rubin PayorPickering, MD 08/10/14 0024  Melissa RudeNathan R. Rubin PayorPickering, MD 08/10/14 [phone]

## 2014-08-10 NOTE — Discharge Instructions (Signed)
Back Pain, Adult °Low back pain is very common. About 1 in 5 people have back pain. The cause of low back pain is rarely dangerous. The pain often gets better over time. About half of people with a sudden onset of back pain feel better in just 2 weeks. About 8 in 10 people feel better by 6 weeks.  °CAUSES °Some common causes of back pain include: °· Strain of the muscles or ligaments supporting the spine. °· Wear and tear (degeneration) of the spinal discs. °· Arthritis. °· Direct injury to the back. °DIAGNOSIS °Most of the time, the direct cause of low back pain is not known. However, back pain can be treated effectively even when the exact cause of the pain is unknown. Answering your caregiver's questions about your overall health and symptoms is one of the most accurate ways to make sure the cause of your pain is not dangerous. If your caregiver needs more information, he or she may order lab work or imaging tests (X-rays or MRIs). However, even if imaging tests show changes in your back, this usually does not require surgery. °HOME CARE INSTRUCTIONS °For many people, back pain returns. Since low back pain is rarely dangerous, it is often a condition that people can learn to manage on their own.  °· Remain active. It is stressful on the back to sit or stand in one place. Do not sit, drive, or stand in one place for more than 30 minutes at a time. Take short walks on level surfaces as soon as pain allows. Try to increase the length of time you walk each day. °· Do not stay in bed. Resting more than 1 or 2 days can delay your recovery. °· Do not avoid exercise or work. Your body is made to move. It is not dangerous to be active, even though your back may hurt. Your back will likely heal faster if you return to being active before your pain is gone. °· Pay attention to your body when you  bend and lift. Many people have less discomfort when lifting if they bend their knees, keep the load close to their bodies, and  avoid twisting. Often, the most comfortable positions are those that put less stress on your recovering back. °· Find a comfortable position to sleep. Use a firm mattress and lie on your side with your knees slightly bent. If you lie on your back, put a pillow under your knees. °· Only take over-the-counter or prescription medicines as directed by your caregiver. Over-the-counter medicines to reduce pain and inflammation are often the most helpful. Your caregiver may prescribe muscle relaxant drugs. These medicines help dull your pain so you can more quickly return to your normal activities and healthy exercise. °· Put ice on the injured area. °¨ Put ice in a plastic bag. °¨ Place a towel between your skin and the bag. °¨ Leave the ice on for 15-20 minutes, 03-04 times a day for the first 2 to 3 days. After that, ice and heat may be alternated to reduce pain and spasms. °· Ask your caregiver about trying back exercises and gentle massage. This may be of some benefit. °· Avoid feeling anxious or stressed. Stress increases muscle tension and can worsen back pain. It is important to recognize when you are anxious or stressed and learn ways to manage it. Exercise is a great option. °SEEK MEDICAL CARE IF: °· You have pain that is not relieved with rest or medicine. °· You have pain that does not improve in 1 week. °· You have new symptoms. °· You are generally not feeling well. °SEEK   IMMEDIATE MEDICAL CARE IF:   You have pain that radiates from your back into your legs.  You develop new bowel or bladder control problems.  You have unusual weakness or numbness in your arms or legs.  You develop nausea or vomiting.  You develop abdominal pain.  You feel faint. Document Released: 09/10/2005 Document Revised: 03/11/2012 Document Reviewed: 01/12/2014 [facility] Patient Information 2015 [place], [place]. This information is not intended to replace advice given to you by your health care provider. Make sure you  discuss any questions you have with your health care provider.  Concussion A concussion, or closed-head injury, is a brain injury caused by a direct blow to the head or by a quick and sudden movement (jolt) of the head or neck. Concussions are usually not life-threatening. Even so, the effects of a concussion can be serious. If you have had a concussion before, you are more likely to experience concussion-like symptoms after a direct blow to the head.  CAUSES  Direct blow to the head, such as from running into another player during a soccer game, being hit in a fight, or hitting your head on a hard surface.  A jolt of the head or neck that causes the brain to move back and forth inside the skull, such as in a car crash. SIGNS AND SYMPTOMS The signs of a concussion can be hard to notice. Early on, they may be missed by you, family members, and health care providers. You may look fine but act or feel differently. Symptoms are usually temporary, but they may last for days, weeks, or even longer. Some symptoms may appear right away while others may not show up for hours or days. Every head injury is different. Symptoms include:  Mild to moderate headaches that will not go away.  A feeling of pressure inside your head.  Having more trouble than usual:  Learning or remembering things you have heard.  Answering questions.  Paying attention or concentrating.  Organizing daily tasks.  Making decisions and solving problems.  Slowness in thinking, acting or reacting, speaking, or reading.  Getting lost or being easily confused.  Feeling tired all the time or lacking energy (fatigued).  Feeling drowsy.  Sleep disturbances.  Sleeping more than usual.  Sleeping less than usual.  Trouble falling asleep.  Trouble sleeping (insomnia).  Loss of balance or feeling lightheaded or dizzy.  Nausea or vomiting.  Numbness or tingling.  Increased sensitivity  to:  Sounds.  Lights.  Distractions.  Vision problems or eyes that tire easily.  Diminished sense of taste or smell.  Ringing in the ears.  Mood changes such as feeling sad or anxious.  Becoming easily irritated or angry for little or no reason.  Lack of motivation.  Seeing or hearing things other people do not see or hear (hallucinations). DIAGNOSIS Your health care provider can usually diagnose a concussion based on a description of your injury and symptoms. He or she will ask whether you passed out (lost consciousness) and whether you are having trouble remembering events that happened right before and during your injury. Your evaluation might include:  A brain scan to look for signs of injury to the brain. Even if the test shows no injury, you may still have a concussion.  Blood tests to be sure other problems are not present. TREATMENT  Concussions are usually treated in an emergency department, in urgent care, or at a clinic. You may need to stay in the hospital overnight for further treatment.  Tell your health care provider if you are taking any medicines, including prescription medicines, over-the-counter medicines, and natural remedies. Some medicines, such as blood thinners (anticoagulants) and aspirin, may increase the chance of complications. Also tell your health care provider whether you have had alcohol or are taking illegal drugs. This information may affect treatment.  Your health care provider will send you home with important instructions to follow.  How fast you will recover from a concussion depends on many factors. These factors include how severe your concussion is, what part of your brain was injured, your age, and how healthy you were before the concussion.  Most people with mild injuries recover fully. Recovery can take time. In general, recovery is slower in older persons. Also, persons who have had a concussion in the past or have other medical  problems may find that it takes longer to recover from their current injury. HOME CARE INSTRUCTIONS General Instructions  Carefully follow the directions your health care provider gave you.  Only take over-the-counter or prescription medicines for pain, discomfort, or fever as directed by your health care provider.  Take only those medicines that your health care provider has approved.  Do not drink alcohol until your health care provider says you are well enough to do so. Alcohol and certain other drugs may slow your recovery and can put you at risk of further injury.  If it is harder than usual to remember things, write them down.  If you are easily distracted, try to do one thing at a time. For example, do not try to watch TV while fixing dinner.  Talk with family members or close friends when making important decisions.  Keep all follow-up appointments. Repeated evaluation of your symptoms is recommended for your recovery.  Watch your symptoms and tell others to do the same. Complications sometimes occur after a concussion. Older adults with a brain injury may have a higher risk of serious complications, such as a blood clot on the brain.  Tell your teachers, school nurse, school counselor, coach, athletic trainer, or work Freight forwarder about your injury, symptoms, and restrictions. Tell them about what you can or cannot do. They should watch for:  Increased problems with attention or concentration.  Increased difficulty remembering or learning new information.  Increased time needed to complete tasks or assignments.  Increased irritability or decreased ability to cope with stress.  Increased symptoms.  Rest. Rest helps the brain to heal. Make sure you:  Get plenty of sleep at night. Avoid staying up late at night.  Keep the same bedtime hours on weekends and weekdays.  Rest during the day. Take daytime naps or rest breaks when you feel tired.  Limit activities that require a  lot of thought or concentration. These include:  Doing homework or job-related work.  Watching TV.  Working on the computer.  Avoid any situation where there is potential for another head injury (football, hockey, soccer, basketball, martial arts, downhill snow sports and horseback riding). Your condition will get worse every time you experience a concussion. You should avoid these activities until you are evaluated by the appropriate follow-up health care providers. Returning To Your Regular Activities You will need to return to your normal activities slowly, not all at once. You must give your body and brain enough time for recovery.  Do not return to sports or other athletic activities until your health care provider tells you it is safe to do so.  Ask your health care provider when  you can drive, ride a bicycle, or operate heavy machinery. Your ability to react may be slower after a brain injury. Never do these activities if you are dizzy.  Ask your health care provider about when you can return to work or school. Preventing Another Concussion It is very important to avoid another brain injury, especially before you have recovered. In rare cases, another injury can lead to permanent brain damage, brain swelling, or death. The risk of this is greatest during the first 7-10 days after a head injury. Avoid injuries by:  Wearing a seat belt when riding in a car.  Drinking alcohol only in moderation.  Wearing a helmet when biking, skiing, skateboarding, skating, or doing similar activities.  Avoiding activities that could lead to a second concussion, such as contact or recreational sports, until your health care provider says it is okay.  Taking safety measures in your home.  Remove clutter and tripping hazards from floors and stairways.  Use grab bars in bathrooms and handrails by stairs.  Place non-slip mats on floors and in bathtubs.  Improve lighting in dim areas. SEEK MEDICAL  CARE IF:  You have increased problems paying attention or concentrating.  You have increased difficulty remembering or learning new information.  You need more time to complete tasks or assignments than before.  You have increased irritability or decreased ability to cope with stress.  You have more symptoms than before. Seek medical care if you have any of the following symptoms for more than 2 weeks after your injury:  Lasting (chronic) headaches.  Dizziness or balance problems.  Nausea.  Vision problems.  Increased sensitivity to noise or light.  Depression or mood swings.  Anxiety or irritability.  Memory problems.  Difficulty concentrating or paying attention.  Sleep problems.  Feeling tired all the time. SEEK IMMEDIATE MEDICAL CARE IF:  You have severe or worsening headaches. These may be a sign of a blood clot in the brain.  You have weakness (even if only in one hand, leg, or part of the face).  You have numbness.  You have decreased coordination.  You vomit repeatedly.  You have increased sleepiness.  One pupil is larger than the other.  You have convulsions.  You have slurred speech.  You have increased confusion. This may be a sign of a blood clot in the brain.  You have increased restlessness, agitation, or irritability.  You are unable to recognize people or places.  You have neck pain.  It is difficult to wake you up.  You have unusual behavior changes.  You lose consciousness. MAKE SURE YOU:  Understand these instructions.  Will watch your condition.  Will get help right away if you are not doing well or get worse. Document Released: 12/01/2003 Document Revised: 09/15/2013 Document Reviewed: 04/02/2013 [facility] Patient Information 2015 [place], [place]. This information is not intended to replace advice given to you by your health care provider. Make sure you discuss any questions you have with your health care  provider.  Diarrhea Diarrhea is frequent loose and watery bowel movements. It can cause you to feel weak and dehydrated. Dehydration can cause you to become tired and thirsty, have a dry mouth, and have decreased urination that often is dark yellow. Diarrhea is a sign of another problem, most often an infection that will not last long. In most cases, diarrhea typically lasts 2-3 days. However, it can last longer if it is a sign of something more serious. It is important to treat  your diarrhea as directed by your caregiver to lessen or prevent future episodes of diarrhea. CAUSES  Some common causes include:  Gastrointestinal infections caused by viruses, bacteria, or parasites.  Food poisoning or food allergies.  Certain medicines, such as antibiotics, chemotherapy, and laxatives.  Artificial sweeteners and fructose.  Digestive disorders. HOME CARE INSTRUCTIONS  Ensure adequate fluid intake (hydration): Have 1 cup (8 oz) of fluid for each diarrhea episode. Avoid fluids that contain simple sugars or sports drinks, fruit juices, whole milk products, and sodas. Your urine should be clear or pale yellow if you are drinking enough fluids. Hydrate with an oral rehydration solution that you can purchase at pharmacies, retail stores, and online. You can prepare an oral rehydration solution at home by mixing the following ingredients together:   - tsp table salt.   tsp baking soda.   tsp salt substitute containing potassium chloride.  1  tablespoons sugar.  1 L (34 oz) of water.  Certain foods and beverages may increase the speed at which food moves through the gastrointestinal (GI) tract. These foods and beverages should be avoided and include:  Caffeinated and alcoholic beverages.  High-fiber foods, such as raw fruits and vegetables, nuts, seeds, and whole grain breads and cereals.  Foods and beverages sweetened with sugar alcohols, such as xylitol, sorbitol, and mannitol.  Some foods  may be well tolerated and may help thicken stool including:  Starchy foods, such as rice, toast, pasta, low-sugar cereal, oatmeal, grits, baked potatoes, crackers, and bagels.  Bananas.  Applesauce.  Add probiotic-rich foods to help increase healthy bacteria in the GI tract, such as yogurt and fermented milk products.  Wash your hands well after each diarrhea episode.  Only take over-the-counter or prescription medicines as directed by your caregiver.  Take a warm bath to relieve any burning or pain from frequent diarrhea episodes. SEEK IMMEDIATE MEDICAL CARE IF:   You are unable to keep fluids down.  You have persistent vomiting.  You have blood in your stool, or your stools are black and tarry.  You do not urinate in 6-8 hours, or there is only a small amount of very dark urine.  You have abdominal pain that increases or localizes.  You have weakness, dizziness, confusion, or light-headedness.  You have a severe headache.  Your diarrhea gets worse or does not get better.  You have a fever or persistent symptoms for more than 2-3 days.  You have a fever and your symptoms suddenly get worse. MAKE SURE YOU:   Understand these instructions.  Will watch your condition.  Will get help right away if you are not doing well or get worse. Document Released: 08/31/2002 Document Revised: 01/25/2014 Document Reviewed: 05/18/2012 [facility] Patient Information 2015 [place], [place]. This information is not intended to replace advice given to you by your health care provider. Make sure you discuss any questions you have with your health care provider.

## 2014-08-10 NOTE — ED Notes (Signed)
Pt alert & oriented x4, stable gait. Patient given discharge instructions, paperwork & prescription(s). Patient  instructed to stop at the registration desk to finish any additional paperwork. Patient verbalized understanding. Pt left department w/ no further questions. 

## 2014-08-16 ENCOUNTER — Encounter (HOSPITAL_COMMUNITY): Payer: Self-pay | Admitting: Emergency Medicine

## 2014-08-16 ENCOUNTER — Emergency Department (HOSPITAL_COMMUNITY)
Admission: EM | Admit: 2014-08-16 | Discharge: 2014-08-16 | Disposition: A | Discharge status: Home / Self Care | Payer: Self-pay | Attending: Emergency Medicine | Admitting: Emergency Medicine

## 2014-08-16 DIAGNOSIS — Z880 Allergy status to penicillin: Secondary | ICD-10-CM | POA: Insufficient documentation

## 2014-08-16 DIAGNOSIS — I10 Essential (primary) hypertension: Secondary | ICD-10-CM | POA: Insufficient documentation

## 2014-08-16 DIAGNOSIS — M5442 Lumbago with sciatica, left side: Secondary | ICD-10-CM | POA: Insufficient documentation

## 2014-08-16 DIAGNOSIS — E119 Type 2 diabetes mellitus without complications: Secondary | ICD-10-CM | POA: Insufficient documentation

## 2014-08-16 DIAGNOSIS — Z720 Tobacco use: Secondary | ICD-10-CM | POA: Insufficient documentation

## 2014-08-16 DIAGNOSIS — Z8659 Personal history of other mental and behavioral disorders: Secondary | ICD-10-CM | POA: Insufficient documentation

## 2014-08-16 DIAGNOSIS — Z79899 Other long term (current) drug therapy: Secondary | ICD-10-CM | POA: Insufficient documentation

## 2014-08-16 HISTORY — DX: Dorsalgia, unspecified: M54.9

## 2014-08-16 LAB — CBG MONITORING, ED: Glucose-Capillary: 129 mg/dL — ABNORMAL HIGH (ref 70–99)

## 2014-08-16 MED ORDER — METHOCARBAMOL 500 MG PO TABS
500.0000 mg | ORAL_TABLET | Freq: Three times a day (TID) | ORAL | Status: DC
Start: 2014-08-16 — End: 2014-09-06

## 2014-08-16 MED ORDER — OXYCODONE-ACETAMINOPHEN 5-325 MG PO TABS: 1.0000 | ORAL_TABLET | ORAL | Status: DC | PRN

## 2014-08-16 MED ORDER — OXYCODONE-ACETAMINOPHEN 5-325 MG PO TABS
2.0000 | ORAL_TABLET | Freq: Once | ORAL | Status: AC
  Administered 2014-08-16: 2 via ORAL
  Filled 2014-08-16: qty 2

## 2014-08-16 NOTE — ED Notes (Signed)
Pain t spine area and LS spine  Area since assisting [facility]

## 2014-08-16 NOTE — ED Provider Notes (Signed)
CSN: [id number]     Arrival date & time 08/16/14  [id number] History  This chart was scribed for non-physician practitioner Pauline Ausammy Caleigh Rabelo, PA-C working with Donnetta HutchingBrian Cook, MD by Littie Deedsichard Sun, ED Scribe. This patient was seen in room APFT20/APFT20 and the patient's care was started at 11:20 AM.     Chief Complaint  Patient presents with  . Back Pain     The history is provided by the patient. No language interpreter was used.   HPI Comments: Melissa Huynh is a 53 y.o. female who presents to the Emergency Department complaining of gradual onset, constant mid to lower back pain radiating down to her left leg and groin that began yesterday while she was helping her 305 lb sister move around. Patient notes exacerbated pain with movement and has had difficulty showering due to the pain. She has been taking ibuprofen 800mg  3 times a day, Tylenol and Advil. Patient reports having associated nausea, mild weakness and tingling in her lower extremities, She denies vomiting, abdominal pain, and numbness, dysurai or incontinence of bladder or bowels  Past Medical History  Diagnosis Date  . Hypertension   . High cholesterol   . Anxiety   . Diabetes mellitus without complication   . Back pain    Past Surgical History  Procedure Laterality Date  . Cholecystectomy    . Tonsillectomy    . Cesarean section    . Tubal ligation     History reviewed. No pertinent family history. History  Substance Use Topics  . Smoking status: Current Every Day Smoker -- 0.50 packs/day    Types: Cigarettes  . Smokeless tobacco: Not on file  . Alcohol Use: No   OB History    No data available     Review of Systems  Constitutional: Negative for fever.  Respiratory: Negative for shortness of breath.   Gastrointestinal: Negative for nausea, vomiting, abdominal pain, diarrhea and constipation.  Genitourinary: Negative for dysuria, hematuria, flank pain, decreased urine volume and difficulty urinating.       Low back pain   Musculoskeletal: Positive for back pain. Negative for joint swelling.  Skin: Negative for rash.  Neurological: Negative for weakness and numbness.  All other systems reviewed and are negative.     Allergies  Cheese; Flexeril; Penicillins; Toradol; and Tramadol  Home Medications   Prior to Admission medications   Medication Sig Start Date End Date Taking? Authorizing Provider  ciprofloxacin (CIPRO) 500 MG tablet Take 1 tablet (500 mg total) by mouth 2 (two) times daily. One po bid x 7 days Patient not taking: Reported on 08/09/2014 07/22/14   Benny LennertJoseph L Zammit, MD  HYDROcodone-acetaminophen (NORCO/VICODIN) 5-325 MG per tablet Take 1 tablet by mouth every 4 (four) hours as needed. Patient not taking: Reported on 08/09/2014 08/02/14   Kathie DikeHobson M Bryant, PA-C  ibuprofen (ADVIL,MOTRIN) 800 MG tablet Take 1 tablet (800 mg total) by mouth every 8 (eight) hours as needed for mild pain. 06/24/14   Kristen N Ward, DO  lisinopril-hydrochlorothiazide (PRINZIDE,ZESTORETIC) 20-12.5 MG per tablet Take 1 tablet by mouth 2 (two) times daily.    Historical Provider, MD  metFORMIN (GLUCOPHAGE) 500 MG tablet Take 500 mg by mouth 2 (two) times daily with a meal.    Historical Provider, MD  methocarbamol (ROBAXIN) 500 MG tablet Take 1 tablet (500 mg total) by mouth 3 (three) times daily. 08/02/14   Kathie DikeHobson M Bryant, PA-C  metoprolol tartrate (LOPRESSOR) 25 MG tablet Take 25 mg by mouth 2 (two) times daily.  Historical Provider, MD  oxyCODONE-acetaminophen (PERCOCET/ROXICET) 5-325 MG per tablet Take 1 tablet by mouth every 6 (six) hours as needed. Patient not taking: Reported on 08/09/2014 07/22/14   Benny LennertJoseph L Zammit, MD  oxyCODONE-acetaminophen (PERCOCET/ROXICET) 5-325 MG per tablet Take 1 tablet by mouth every 6 (six) hours as needed. Patient not taking: Reported on 08/09/2014 07/22/14   Benny LennertJoseph L Zammit, MD  oxyCODONE-acetaminophen (PERCOCET/ROXICET) 5-325 MG per tablet Take 1 tablet by mouth every 6 (six) hours  as needed. Patient not taking: Reported on 08/09/2014 07/22/14   Benny LennertJoseph L Zammit, MD  oxyCODONE-acetaminophen (PERCOCET/ROXICET) 5-325 MG per tablet Take 1 tablet by mouth every 6 (six) hours as needed. Patient not taking: Reported on 08/09/2014 07/22/14   Benny LennertJoseph L Zammit, MD   BP 138/96 mmHg  Pulse 97  Temp(Src) 98.2 F (36.8 C) (Oral)  Resp 18  Ht 6' (1.829 m)  Wt 203 lb (92.08 kg)  BMI 27.53 kg/m2  SpO2 100%  LMP 01/18/2014 Physical Exam  Constitutional: She is oriented to person, place, and time. She appears well-developed and well-nourished. No distress.  HENT:  Head: Normocephalic and atraumatic.  Mouth/Throat: Oropharynx is clear and moist. No oropharyngeal exudate.  Eyes: Pupils are equal, round, and reactive to light.  Neck: Neck supple.  Cardiovascular: Normal rate.   Pulmonary/Chest: Effort normal.  Musculoskeletal: She exhibits no edema.  Diffuse tenderness along upper lumbar spine and paraspinal muscles. No edema. 5/5 strength against resistance bilateral lower extremities.   Neurological: She is alert and oriented to person, place, and time. No cranial nerve deficit.  Skin: Skin is warm and dry. No rash noted.  Psychiatric: She has a normal mood and affect. Her behavior is normal.  Nursing note and vitals reviewed.   ED Course  Procedures  DIAGNOSTIC STUDIES: Oxygen Saturation is 100% on room air, normal by my interpretation.    COORDINATION OF CARE: 11:27 AM-Discussed treatment plan which includes pain medication with pt at bedside and pt agreed to plan.    Labs Review Labs Reviewed  CBG MONITORING, ED - Abnormal; Notable for the following:    Glucose-Capillary 129 (*)    All other components within normal limits    Imaging Review No results found.   EKG Interpretation None      MDM   Final diagnoses:  Bilateral low back pain with left-sided sciatica    Pt is ambulatory, no focal neruo deficits.  no concerning sx for emergent neurological  or infectious process.  Agrees to arrange PMD f/u.  rx for percocet and robaxin.    I personally performed the services described in this documentation, which was scribed in my presence. The recorded information has been reviewed and is accurate.    Izzy Courville L. Trisha Mangleriplett, PA-C 08/18/14 1554  Donnetta HutchingBrian Cook, MD 08/21/14 1052

## 2014-08-16 NOTE — Discharge Instructions (Signed)
Back Pain, Adult °Back pain is very common. The pain often gets better over time. The cause of back pain is usually not dangerous. Most people can learn to manage their back pain on their own.  °HOME CARE  °· Stay active. Start with short walks on flat ground if you can. Try to walk farther each day. °· Do not sit, drive, or stand in one place for more than 30 minutes. Do not stay in bed. °· Do not avoid exercise or work. Activity can help your back heal faster. °· Be careful when you bend or lift an object. Bend at your knees, keep the object close to you, and do not twist. °· Sleep on a firm mattress. Lie on your side, and bend your knees. If you lie on your back, put a pillow under your knees. °· Only take medicines as told by your doctor. °· Put ice on the injured area. °¨ Put ice in a plastic bag. °¨ Place a towel between your skin and the bag. °¨ Leave the ice on for 15-20 minutes, 03-04 times a day for the first 2 to 3 days. After that, you can switch between ice and heat packs. °· Ask your doctor about back exercises or massage. °· Avoid feeling anxious or stressed. Find good ways to deal with stress, such as exercise. °GET HELP RIGHT AWAY IF:  °· Your pain does not go away with rest or medicine. °· Your pain does not go away in 1 week. °· You have new problems. °· You do not feel well. °· The pain spreads into your legs. °· You cannot control when you poop (bowel movement) or pee (urinate). °· Your arms or legs feel weak or lose feeling (numbness). °· You feel sick to your stomach (nauseous) or throw up (vomit). °· You have belly (abdominal) pain. °· You feel like you may pass out (faint). °MAKE SURE YOU:  °· Understand these instructions. °· Will watch your condition. °· Will get help right away if you are not doing well or get worse. °Document Released: 02/27/2008 Document Revised: 12/03/2011 Document Reviewed: 01/12/2014 °[employer]® Patient Information ©2015 [employer]. This information is not intended  to replace advice given to you by your health care provider. Make sure you discuss any questions you have with your health care provider. ° °

## 2014-08-16 NOTE — Care Management Note (Signed)
ED/CM noted patient did not have health insurance and/or PCP listed in the computer.  Patient was given the [place] resource handout with information on the clinics, food pantries, and the handout for new health insurance sign-up.  Patient expressed appreciation for information received. 

## 2014-08-16 NOTE — ED Notes (Signed)
Patient complaining of back pain after moving sister yesterday.

## 2014-08-22 ENCOUNTER — Emergency Department (HOSPITAL_COMMUNITY)
Admission: EM | Admit: 2014-08-22 | Discharge: 2014-08-22 | Disposition: A | Discharge status: Home / Self Care | Payer: Self-pay | Attending: Emergency Medicine | Admitting: Emergency Medicine

## 2014-08-22 ENCOUNTER — Encounter (HOSPITAL_COMMUNITY): Payer: Self-pay

## 2014-08-22 DIAGNOSIS — Y998 Other external cause status: Secondary | ICD-10-CM | POA: Insufficient documentation

## 2014-08-22 DIAGNOSIS — Z880 Allergy status to penicillin: Secondary | ICD-10-CM | POA: Insufficient documentation

## 2014-08-22 DIAGNOSIS — Z8659 Personal history of other mental and behavioral disorders: Secondary | ICD-10-CM | POA: Insufficient documentation

## 2014-08-22 DIAGNOSIS — S3992XA Unspecified injury of lower back, initial encounter: Secondary | ICD-10-CM | POA: Insufficient documentation

## 2014-08-22 DIAGNOSIS — W11XXXA Fall on and from ladder, initial encounter: Secondary | ICD-10-CM | POA: Insufficient documentation

## 2014-08-22 DIAGNOSIS — Z79899 Other long term (current) drug therapy: Secondary | ICD-10-CM | POA: Insufficient documentation

## 2014-08-22 DIAGNOSIS — M545 Low back pain: Secondary | ICD-10-CM

## 2014-08-22 DIAGNOSIS — Z720 Tobacco use: Secondary | ICD-10-CM | POA: Insufficient documentation

## 2014-08-22 DIAGNOSIS — E119 Type 2 diabetes mellitus without complications: Secondary | ICD-10-CM | POA: Insufficient documentation

## 2014-08-22 DIAGNOSIS — S8391XA Sprain of unspecified site of right knee, initial encounter: Secondary | ICD-10-CM | POA: Insufficient documentation

## 2014-08-22 DIAGNOSIS — Y9289 Other specified places as the place of occurrence of the external cause: Secondary | ICD-10-CM | POA: Insufficient documentation

## 2014-08-22 DIAGNOSIS — S7011XA Contusion of right thigh, initial encounter: Secondary | ICD-10-CM | POA: Insufficient documentation

## 2014-08-22 DIAGNOSIS — G8929 Other chronic pain: Secondary | ICD-10-CM | POA: Insufficient documentation

## 2014-08-22 DIAGNOSIS — I10 Essential (primary) hypertension: Secondary | ICD-10-CM | POA: Insufficient documentation

## 2014-08-22 DIAGNOSIS — Y9389 Activity, other specified: Secondary | ICD-10-CM | POA: Insufficient documentation

## 2014-08-22 MED ORDER — HYDROCODONE-ACETAMINOPHEN 5-325 MG PO TABS
1.0000 | ORAL_TABLET | Freq: Once | ORAL | Status: AC
  Administered 2014-08-22: 1 via ORAL
  Filled 2014-08-22: qty 1

## 2014-08-22 MED ORDER — HYDROCODONE-ACETAMINOPHEN 5-325 MG PO TABS: ORAL_TABLET | ORAL | Status: DC

## 2014-08-22 NOTE — Discharge Instructions (Signed)
Contusion A contusion is a deep bruise. Contusions happen when an injury causes bleeding under the skin. Signs of bruising include pain, puffiness (swelling), and discolored skin. The contusion may turn blue, purple, or yellow. HOME CARE   Put ice on the injured area.  Put ice in a plastic bag.  Place a towel between your skin and the bag.  Leave the ice on for 15-20 minutes, 03-04 times a day.  Only take medicine as told by your doctor.  Rest the injured area.  If possible, raise (elevate) the injured area to lessen puffiness. GET HELP RIGHT AWAY IF:   You have more bruising or puffiness.  You have pain that is getting worse.  Your puffiness or pain is not helped by medicine. MAKE SURE YOU:   Understand these instructions.  Will watch your condition.  Will get help right away if you are not doing well or get worse. Document Released: 02/27/2008 Document Revised: 12/03/2011 Document Reviewed: 07/16/2011 [facility] Patient Information 2015 [place], [place]. This information is not intended to replace advice given to you by your health care provider. Make sure you discuss any questions you have with your health care provider.  Knee Sprain A knee sprain is a tear in the strong bands of tissue that connect the bones (ligaments) of your knee. HOME CARE  Raise (elevate) your injured knee to lessen puffiness (swelling).  To ease pain and puffiness, put ice on the injured area.  Put ice in a plastic bag.  Place a towel between your skin and the bag.  Leave the ice on for 20 minutes, 2-3 times a day.  Only take medicine as told by your doctor.  Do not leave your knee unprotected until pain and stiffness go away (usually 4-6 weeks).  If you have a cast or splint, do not get it wet. If your doctor told you to not take it off, cover it with a plastic bag when you shower or bathe. Do not swim.  Your doctor may have you do exercises to prevent or limit permanent weakness and  stiffness. GET HELP RIGHT AWAY IF:   Your cast or splint becomes damaged.  Your pain gets worse.  You have a lot of pain, puffiness, or numbness below the cast or splint. MAKE SURE YOU:   Understand these instructions.  Will watch your condition.  Will get help right away if you are not doing well or get worse. Document Released: 08/29/2009 Document Revised: 09/15/2013 Document Reviewed: 05/19/2013 [facility] Patient Information 2015 [place], [place]. This information is not intended to replace advice given to you by your health care provider. Make sure you discuss any questions you have with your health care provider.

## 2014-08-22 NOTE — ED Notes (Signed)
Right leg injury, fell off a ladder that broke yesterday around 4 pm. Hurting real bad per pt.

## 2014-08-22 NOTE — ED Provider Notes (Signed)
CSN: [id number]     Arrival date & time 08/22/14  2102 History   First MD Initiated Contact with Patient 08/22/14 2129     Chief Complaint  Patient presents with  . Leg Pain     (Consider location/radiation/quality/duration/timing/severity/associated sxs/prior Treatment) HPI  Melissa Huynh is a 53 y.o. female who presents to the Emergency Department complaining of pain to her right thigh after a fall from the last two steps of a ladder.  Pain radiates to her lateral right knee. She states that the rung of a wooden ladder broke and she fell approx 2-3 feet and struck her right inner thigh on the ladder.  She states the injury occurred one day prior to ED arrival.  She reports the pain has worsen today and she has been taking OTC medication without relief.  She also complains of a large bruise to the injured area and states the injury has also caused worsening of her chronic low back pain.  Pain is worse with walking or standing and improves at rest.  She denies swelling, numbness or weakness of the leg .  She also denies head or neck injury, dizziness or LOC.     Past Medical History  Diagnosis Date  . Hypertension   . High cholesterol   . Anxiety   . Diabetes mellitus without complication   . Back pain    Past Surgical History  Procedure Laterality Date  . Cholecystectomy    . Tonsillectomy    . Cesarean section    . Tubal ligation     No family history on file. History  Substance Use Topics  . Smoking status: Current Every Day Smoker -- 0.50 packs/day    Types: Cigarettes  . Smokeless tobacco: Not on file  . Alcohol Use: No   OB History    No data available     Review of Systems  Constitutional: Negative for fever and chills.  Gastrointestinal: Negative for nausea and vomiting.  Genitourinary: Negative for dysuria and difficulty urinating.  Musculoskeletal: Positive for arthralgias. Negative for joint swelling, neck pain and neck stiffness.  Skin: Negative for color  change and wound.  Neurological: Negative for dizziness, syncope, weakness, numbness and headaches.  All other systems reviewed and are negative.     Allergies  Cheese; Flexeril; Penicillins; Toradol; and Tramadol  Home Medications   Prior to Admission medications   Medication Sig Start Date End Date Taking? Authorizing Provider  ciprofloxacin (CIPRO) 500 MG tablet Take 1 tablet (500 mg total) by mouth 2 (two) times daily. One po bid x 7 days Patient not taking: Reported on 08/09/2014 07/22/14   Benny LennertJoseph L Zammit, MD  HYDROcodone-acetaminophen (NORCO/VICODIN) 5-325 MG per tablet Take 1 tablet by mouth every 4 (four) hours as needed. Patient not taking: Reported on 08/09/2014 08/02/14   Kathie DikeHobson M Bryant, PA-C  ibuprofen (ADVIL,MOTRIN) 800 MG tablet Take 1 tablet (800 mg total) by mouth every 8 (eight) hours as needed for mild pain. 06/24/14   Kristen N Ward, DO  lisinopril-hydrochlorothiazide (PRINZIDE,ZESTORETIC) 20-12.5 MG per tablet Take 1 tablet by mouth 2 (two) times daily.    Historical Provider, MD  metFORMIN (GLUCOPHAGE) 500 MG tablet Take 500 mg by mouth 2 (two) times daily with a meal.    Historical Provider, MD  methocarbamol (ROBAXIN) 500 MG tablet Take 1 tablet (500 mg total) by mouth 3 (three) times daily. 08/16/14   Zayley Arras L. Peder Allums, PA-C  metoprolol tartrate (LOPRESSOR) 25 MG tablet Take 25 mg by mouth  2 (two) times daily.    Historical Provider, MD  oxyCODONE-acetaminophen (PERCOCET/ROXICET) 5-325 MG per tablet Take 1 tablet by mouth every 4 (four) hours as needed. 08/16/14   Gwendy Boeder L. Sephiroth Mcluckie, PA-C   BP 164/86 mmHg  Pulse 99  Temp(Src) 98.2 F (36.8 C) (Oral)  Resp 20  Ht 6' (1.829 m)  Wt 203 lb (92.08 kg)  BMI 27.53 kg/m2  SpO2 100%  LMP 01/18/2014 Physical Exam  Constitutional: She is oriented to person, place, and time. She appears well-developed and well-nourished. No distress.  HENT:  Head: Atraumatic.  Neck: Normal range of motion. Neck supple.   Cardiovascular: Normal rate, regular rhythm, normal heart sounds and intact distal pulses.   No murmur heard. Pulmonary/Chest: Effort normal and breath sounds normal. No respiratory distress.  Musculoskeletal: She exhibits tenderness. She exhibits no edema.  Diffuse ttp of the right medial thigh.  5 cm area of ecchymosis to the medial upper thigh.  No bony tenderness on exam.  Diffuse tenderness to palp of the bilateral lumbar paraspinal muscles.  No edema or ecchymosis.  Pt has FROM of the knee and hip.  DP pulse and distal sensation intact.  Compartments of the right LE are soft.    Neurological: She is alert and oriented to person, place, and time. She exhibits normal muscle tone. Coordination normal.  Skin: Skin is warm and dry.  Nursing note and vitals reviewed.   ED Course  Procedures (including critical care time) Labs Review Labs Reviewed - No data to display  Imaging Review No results found.   EKG Interpretation None      MDM   Final diagnoses:  Contusion of right thigh, initial encounter  Bilateral low back pain, with sciatica presence unspecified  Sprain, knee, right, initial encounter     patient seen by me here on 08/16/14 for back pain, reports that she has appt on Dec 3 with PMD.  She is ambulatory.  No focal neuro deficits, ambulates with a steady gait.  No concerning sx's for emergent neurological deficits.  No bony deformities of the right leg. NV intact.  Ace wrap applied for comfort to the right knee, rx for vicodin    [name] L. Trisha Mangleriplett, PA-C 08/24/14 0052  Layla MawKristen N Ward, DO 08/25/14 [phone]

## 2014-09-06 ENCOUNTER — Encounter (HOSPITAL_COMMUNITY): Payer: Self-pay | Admitting: Emergency Medicine

## 2014-09-06 ENCOUNTER — Emergency Department (HOSPITAL_COMMUNITY)
Admission: EM | Admit: 2014-09-06 | Discharge: 2014-09-06 | Disposition: A | Discharge status: Home / Self Care | Payer: Self-pay | Attending: Emergency Medicine | Admitting: Emergency Medicine

## 2014-09-06 DIAGNOSIS — W010XXA Fall on same level from slipping, tripping and stumbling without subsequent striking against object, initial encounter: Secondary | ICD-10-CM | POA: Insufficient documentation

## 2014-09-06 DIAGNOSIS — Y998 Other external cause status: Secondary | ICD-10-CM | POA: Insufficient documentation

## 2014-09-06 DIAGNOSIS — M549 Dorsalgia, unspecified: Secondary | ICD-10-CM

## 2014-09-06 DIAGNOSIS — Z8659 Personal history of other mental and behavioral disorders: Secondary | ICD-10-CM | POA: Insufficient documentation

## 2014-09-06 DIAGNOSIS — Y929 Unspecified place or not applicable: Secondary | ICD-10-CM | POA: Insufficient documentation

## 2014-09-06 DIAGNOSIS — G8929 Other chronic pain: Secondary | ICD-10-CM | POA: Insufficient documentation

## 2014-09-06 DIAGNOSIS — Z791 Long term (current) use of non-steroidal anti-inflammatories (NSAID): Secondary | ICD-10-CM | POA: Insufficient documentation

## 2014-09-06 DIAGNOSIS — E119 Type 2 diabetes mellitus without complications: Secondary | ICD-10-CM | POA: Insufficient documentation

## 2014-09-06 DIAGNOSIS — Y9389 Activity, other specified: Secondary | ICD-10-CM | POA: Insufficient documentation

## 2014-09-06 DIAGNOSIS — Z79899 Other long term (current) drug therapy: Secondary | ICD-10-CM | POA: Insufficient documentation

## 2014-09-06 DIAGNOSIS — Z880 Allergy status to penicillin: Secondary | ICD-10-CM | POA: Insufficient documentation

## 2014-09-06 DIAGNOSIS — Z8639 Personal history of other endocrine, nutritional and metabolic disease: Secondary | ICD-10-CM | POA: Insufficient documentation

## 2014-09-06 DIAGNOSIS — Z720 Tobacco use: Secondary | ICD-10-CM | POA: Insufficient documentation

## 2014-09-06 DIAGNOSIS — S3992XA Unspecified injury of lower back, initial encounter: Secondary | ICD-10-CM | POA: Insufficient documentation

## 2014-09-06 DIAGNOSIS — I10 Essential (primary) hypertension: Secondary | ICD-10-CM | POA: Insufficient documentation

## 2014-09-06 MED ORDER — DICLOFENAC SODIUM 75 MG PO TBEC: 75.0000 mg | DELAYED_RELEASE_TABLET | Freq: Two times a day (BID) | ORAL | Status: DC

## 2014-09-06 MED ORDER — OXYCODONE-ACETAMINOPHEN 5-325 MG PO TABS
1.0000 | ORAL_TABLET | Freq: Once | ORAL | Status: AC
  Administered 2014-09-06: 1 via ORAL
  Filled 2014-09-06: qty 1

## 2014-09-06 MED ORDER — METHOCARBAMOL 500 MG PO TABS: 500.0000 mg | ORAL_TABLET | Freq: Two times a day (BID) | ORAL | Status: DC

## 2014-09-06 MED ORDER — METHOCARBAMOL 500 MG PO TABS
750.0000 mg | ORAL_TABLET | Freq: Once | ORAL | Status: AC
  Administered 2014-09-06: 750 mg via ORAL
  Filled 2014-09-06: qty 2

## 2014-09-06 NOTE — ED Provider Notes (Signed)
CSN: [id number]     Arrival date & time 09/06/14  1445 History  This chart was scribed for non-physician practitioner working with No att. providers found by Elveria Rising, ED Scribe. This patient was seen in room APFT23/APFT23 and the patient's care was started at 3:35 PM.   Chief Complaint  Patient presents with  . Fall    The history is provided by the patient. No language interpreter was used.   HPI Comments: Melissa Huynh is a 53 y.o. female with PMHx chronic back pain, Hypertension, Diabetes Mellitus, and anxiety who presents to the Emergency Department complaining of aggravated lower back pain and left shoulder pain after a near fall sustained last night. Patient reports slipping and hitting her left side in her tiny shower. Patient reports hitting her left shoulder on the water knob, falling backwards and hitting her buttocks with her back against her shower wall and then slid on her buttocks. Patient denies head injury or loss of consciousness due to her falling. Patient reports that due to her history of her sciatic pain, she frequently loses her balance as her legs give out. Patient reports that she is followed for her back pain by her PCP. Patient states she is actively seeking enrollment in a pain clinic.     Past Medical History  Diagnosis Date  . Hypertension   . High cholesterol   . Anxiety   . Diabetes mellitus without complication   . Back pain    Past Surgical History  Procedure Laterality Date  . Cholecystectomy    . Tonsillectomy    . Cesarean section    . Tubal ligation     Family History  Problem Relation Age of Onset  . Hypertension Mother   . Hypertension Sister    History  Substance Use Topics  . Smoking status: Current Every Day Smoker -- 0.50 packs/day    Types: Cigarettes  . Smokeless tobacco: Not on file  . Alcohol Use: No   OB History    Gravida Para Term Preterm AB TAB SAB Ectopic Multiple Living   6 3 3  3  3         Review of Systems   Constitutional: Negative for fever and chills.  Musculoskeletal: Positive for myalgias, back pain and arthralgias. Negative for gait problem.       Left shoulder pain  Neurological: Negative for syncope, weakness and numbness.  all other systems negative  Allergies  Cheese; Flexeril; Penicillins; Toradol; and Tramadol  Home Medications   Prior to Admission medications   Medication Sig Start Date End Date Taking? Authorizing Provider  ibuprofen (ADVIL,MOTRIN) 800 MG tablet Take 1 tablet (800 mg total) by mouth every 8 (eight) hours as needed for mild pain. 06/24/14  Yes Kristen N Ward, DO  lisinopril-hydrochlorothiazide (PRINZIDE,ZESTORETIC) 20-12.5 MG per tablet Take 1 tablet by mouth 2 (two) times daily.   Yes Historical Provider, MD  metFORMIN (GLUCOPHAGE) 500 MG tablet Take 500 mg by mouth 2 (two) times daily with a meal.   Yes Historical Provider, MD  metoprolol tartrate (LOPRESSOR) 25 MG tablet Take 25 mg by mouth 2 (two) times daily.   Yes Historical Provider, MD  diclofenac (VOLTAREN) 75 MG EC tablet Take 1 tablet (75 mg total) by mouth 2 (two) times daily. 09/06/14   Damyia Strider Orlene Och, NP  methocarbamol (ROBAXIN) 500 MG tablet Take 1 tablet (500 mg total) by mouth 2 (two) times daily. 09/06/14   Sebastien Jackson Orlene Och, NP   Triage Vitals: BP  148/81 mmHg  Pulse 73  Temp(Src) 97.7 F (36.5 C) (Oral)  Resp 18  Ht 6' (1.829 m)  Wt 205 lb (92.987 kg)  BMI 27.80 kg/m2  SpO2 100%  LMP 01/18/2014 Physical Exam  Constitutional: She is oriented to person, place, and time. She appears well-developed and well-nourished.  HENT:  Head: Normocephalic and atraumatic.  Eyes: Conjunctivae and EOM are normal. Pupils are equal, round, and reactive to light.  Neck: Normal range of motion. Neck supple.  Cardiovascular: Normal rate, regular rhythm and normal heart sounds.   Pulmonary/Chest: Effort normal and breath sounds normal. No respiratory distress. She has no wheezes. She has no rales.  Abdominal:  There is no tenderness.  Musculoskeletal: Normal range of motion. She exhibits no edema.       Left shoulder: She exhibits tenderness and pain. She exhibits normal range of motion, no swelling, no crepitus, no deformity, normal pulse and normal strength.  Pulses equal. No lower extremity edema. Good grips equal strength. Radial pulses equal. PT and DP reflexes intact. Posterior aspect of left shoulder there is muscle spasm noted.   Neurological: She is alert and oriented to person, place, and time. No cranial nerve deficit.  Sensation intact. Ambulatory with steady gait, no foot drag.   Skin: Skin is warm and dry.  Psychiatric: She has a normal mood and affect. Her behavior is normal.  Nursing note and vitals reviewed.   ED Course  Procedures (including critical care time)  COORDINATION OF CARE: 3:41 PM- Discussed treatment plan with patient at bedside and patient agreed to plan.    MDM  53 y.o. female with hx of chronic pain. Pain to her lower back and left shoulder after falling against the shower wall 1 day ago. Stable for discharge without neuro deficits. Will treat for muscle spasm and inflammation. She will follow up with PCP for her chronic pain.    Medication List    STOP taking these medications        HYDROcodone-acetaminophen 5-325 MG per tablet  Commonly known as:  NORCO/VICODIN     oxyCODONE-acetaminophen 5-325 MG per tablet  Commonly known as:  PERCOCET/ROXICET      TAKE these medications        diclofenac 75 MG EC tablet  Commonly known as:  VOLTAREN  Take 1 tablet (75 mg total) by mouth 2 (two) times daily.     methocarbamol 500 MG tablet  Commonly known as:  ROBAXIN  Take 1 tablet (500 mg total) by mouth 2 (two) times daily.      ASK your doctor about these medications        ibuprofen 800 MG tablet  Commonly known as:  ADVIL,MOTRIN  Take 1 tablet (800 mg total) by mouth every 8 (eight) hours as needed for mild pain.     lisinopril-hydrochlorothiazide  20-12.5 MG per tablet  Commonly known as:  PRINZIDE,ZESTORETIC  Take 1 tablet by mouth 2 (two) times daily.     metFORMIN 500 MG tablet  Commonly known as:  GLUCOPHAGE  Take 500 mg by mouth 2 (two) times daily with a meal.     metoprolol tartrate 25 MG tablet  Commonly known as:  LOPRESSOR  Take 25 mg by mouth 2 (two) times daily.        Final diagnoses:  Chronic back pain   I personally performed the services described in this documentation, which was scribed in my presence. The recorded information has been reviewed and is accurate.  [facility] Orlene OchM Takerra Lupinacci, NP 09/07/14 1636  Samuel JesterKathleen McManus, DO 09/08/14 1556

## 2014-09-06 NOTE — Discharge Instructions (Signed)
It is important that you see your primary care doctor for your chronic pain. We are treating you with medications today for pain and muscle spasm. Do not drive while taking the medication as it may make you sleepy.

## 2014-09-06 NOTE — ED Notes (Signed)
Pt reports falling in the shower last night. Hitting her L shoulder on a water knob and hurting her back. Pt ambulated to Triage without difficulty.

## 2014-10-07 ENCOUNTER — Encounter (HOSPITAL_COMMUNITY): Payer: Self-pay | Admitting: Emergency Medicine

## 2014-10-07 ENCOUNTER — Emergency Department (HOSPITAL_COMMUNITY)
Admission: EM | Admit: 2014-10-07 | Discharge: 2014-10-07 | Disposition: A | Discharge status: Home / Self Care | Payer: Self-pay | Attending: Emergency Medicine | Admitting: Emergency Medicine

## 2014-10-07 DIAGNOSIS — S233XXA Sprain of ligaments of thoracic spine, initial encounter: Secondary | ICD-10-CM

## 2014-10-07 DIAGNOSIS — F419 Anxiety disorder, unspecified: Secondary | ICD-10-CM | POA: Insufficient documentation

## 2014-10-07 DIAGNOSIS — G8929 Other chronic pain: Secondary | ICD-10-CM | POA: Insufficient documentation

## 2014-10-07 DIAGNOSIS — S239XXA Sprain of unspecified parts of thorax, initial encounter: Secondary | ICD-10-CM | POA: Insufficient documentation

## 2014-10-07 DIAGNOSIS — S8992XA Unspecified injury of left lower leg, initial encounter: Secondary | ICD-10-CM | POA: Insufficient documentation

## 2014-10-07 DIAGNOSIS — M25562 Pain in left knee: Secondary | ICD-10-CM

## 2014-10-07 DIAGNOSIS — Z880 Allergy status to penicillin: Secondary | ICD-10-CM | POA: Insufficient documentation

## 2014-10-07 DIAGNOSIS — Y998 Other external cause status: Secondary | ICD-10-CM | POA: Insufficient documentation

## 2014-10-07 DIAGNOSIS — E119 Type 2 diabetes mellitus without complications: Secondary | ICD-10-CM | POA: Insufficient documentation

## 2014-10-07 DIAGNOSIS — Z79899 Other long term (current) drug therapy: Secondary | ICD-10-CM | POA: Insufficient documentation

## 2014-10-07 DIAGNOSIS — I10 Essential (primary) hypertension: Secondary | ICD-10-CM | POA: Insufficient documentation

## 2014-10-07 DIAGNOSIS — Z720 Tobacco use: Secondary | ICD-10-CM | POA: Insufficient documentation

## 2014-10-07 DIAGNOSIS — S29012A Strain of muscle and tendon of back wall of thorax, initial encounter: Secondary | ICD-10-CM

## 2014-10-07 DIAGNOSIS — S29019A Strain of muscle and tendon of unspecified wall of thorax, initial encounter: Secondary | ICD-10-CM | POA: Insufficient documentation

## 2014-10-07 DIAGNOSIS — Y9389 Activity, other specified: Secondary | ICD-10-CM | POA: Insufficient documentation

## 2014-10-07 DIAGNOSIS — S39012A Strain of muscle, fascia and tendon of lower back, initial encounter: Secondary | ICD-10-CM | POA: Insufficient documentation

## 2014-10-07 DIAGNOSIS — X58XXXA Exposure to other specified factors, initial encounter: Secondary | ICD-10-CM | POA: Insufficient documentation

## 2014-10-07 DIAGNOSIS — Y9289 Other specified places as the place of occurrence of the external cause: Secondary | ICD-10-CM | POA: Insufficient documentation

## 2014-10-07 MED ORDER — ONDANSETRON HCL 4 MG PO TABS
4.0000 mg | ORAL_TABLET | Freq: Once | ORAL | Status: AC
Start: 2014-10-07 — End: 2014-10-07
  Administered 2014-10-07: 4 mg via ORAL
  Filled 2014-10-07: qty 1

## 2014-10-07 MED ORDER — ACETAMINOPHEN-CODEINE #3 300-30 MG PO TABS: 1.0000 | ORAL_TABLET | Freq: Four times a day (QID) | ORAL | Status: DC | PRN

## 2014-10-07 MED ORDER — METHOCARBAMOL 500 MG PO TABS: 500.0000 mg | ORAL_TABLET | Freq: Three times a day (TID) | ORAL | Status: DC

## 2014-10-07 MED ORDER — DIAZEPAM 5 MG PO TABS
5.0000 mg | ORAL_TABLET | Freq: Once | ORAL | Status: AC
  Administered 2014-10-07: 5 mg via ORAL
  Filled 2014-10-07: qty 1

## 2014-10-07 MED ORDER — ACETAMINOPHEN-CODEINE #3 300-30 MG PO TABS
1.0000 | ORAL_TABLET | Freq: Once | ORAL | Status: AC
  Administered 2014-10-07: 1 via ORAL
  Filled 2014-10-07: qty 1

## 2014-10-07 NOTE — ED Notes (Signed)
Pt reports was opening a dresser drawer and reported felt a "pop" sensation in her upper back. nad noted.

## 2014-10-07 NOTE — ED Provider Notes (Signed)
CSN: [id number]     Arrival date & time 10/07/14  1853 History   First MD Initiated Contact with Patient 10/07/14 2036     Chief Complaint  Patient presents with  . Back Pain     (Consider location/radiation/quality/duration/timing/severity/associated sxs/prior Treatment) HPI Comments: Patient is a 54 year old female who presents to the emergency department with a complaint of back pain. The patient has a history of chronic back pain, as well as degenerative disc disease. She states that [facility], but she has been having increasing pain since moving a heavy dresser on yesterday, and has not been able to get any rest since that time. She states that her primary physician advised her to use over-the-counter medicines, as he cannot prescribe pain medications. There was no loss of bowel or bladder function. The patient does have some pain involving the left knee, and this is not new, but has been worse since the patient moved the dresser. She presents now for assistance with her pain.  Patient is a 54 y.o. female presenting with back pain. The history is provided by the patient.  Back Pain Associated symptoms: no abdominal pain, no chest pain and no dysuria     Past Medical History  Diagnosis Date  . Hypertension   . High cholesterol   . Anxiety   . Diabetes mellitus without complication   . Back pain    Past Surgical History  Procedure Laterality Date  . Cholecystectomy    . Tonsillectomy    . Cesarean section    . Tubal ligation     Family History  Problem Relation Age of Onset  . Hypertension Mother   . Hypertension Sister    History  Substance Use Topics  . Smoking status: Current Every Day Smoker -- 0.50 packs/day    Types: Cigarettes  . Smokeless tobacco: Not on file  . Alcohol Use: No   OB History    Gravida Para Term Preterm AB TAB SAB Ectopic Multiple Living   6 3 3  3  3         Review of  Systems  Constitutional: Negative for activity change.       All ROS Neg except as noted in HPI  HENT: Negative.   Eyes: Negative for photophobia and discharge.  Respiratory: Negative for cough, shortness of breath and wheezing.   Cardiovascular: Negative for chest pain and palpitations.  Gastrointestinal: Negative for abdominal pain and blood in stool.  Genitourinary: Negative for dysuria, frequency and hematuria.  Musculoskeletal: Positive for back pain and arthralgias. Negative for neck pain.  Skin: Negative.   Neurological: Negative for dizziness, seizures and speech difficulty.  Psychiatric/Behavioral: Negative for hallucinations and confusion. The patient is nervous/anxious.       Allergies  Cheese; Flexeril; Penicillins; Toradol; and Tramadol  Home Medications   Prior to Admission medications   Medication Sig Start Date End Date Taking? Authorizing Provider  diclofenac (VOLTAREN) 75 MG EC tablet Take 1 tablet (75 mg total) by mouth 2 (two) times daily. 09/06/14   Hope Orlene OchM Neese, NP  ibuprofen (ADVIL,MOTRIN) 800 MG tablet Take 1 tablet (800 mg total) by mouth every 8 (eight) hours as needed for mild pain. 06/24/14   Kristen N Ward, DO  lisinopril-hydrochlorothiazide (PRINZIDE,ZESTORETIC) 20-12.5 MG per tablet Take 1 tablet by mouth 2 (two) times daily.    Historical Provider, MD  metFORMIN (GLUCOPHAGE) 500 MG tablet Take 500 mg by  mouth 2 (two) times daily with a meal.    Historical Provider, MD  methocarbamol (ROBAXIN) 500 MG tablet Take 1 tablet (500 mg total) by mouth 2 (two) times daily. 09/06/14   Hope Orlene Och, NP  metoprolol tartrate (LOPRESSOR) 25 MG tablet Take 25 mg by mouth 2 (two) times daily.    Historical Provider, MD   BP 119/80 mmHg  Pulse 107  Temp(Src) 98.7 F (37.1 C) (Oral)  Resp 18  Ht  (1.854 m)  Wt 203 lb 4.8 oz (92.216 kg)  BMI 26.83 kg/m2  SpO2 95%  LMP 01/18/2014 Physical Exam  Constitutional: She is oriented to person, place, and time. She  appears well-developed and well-nourished.  Non-toxic appearance.  HENT:  Head: Normocephalic.  Right Ear: Tympanic membrane and external ear normal.  Left Ear: Tympanic membrane and external ear normal.  Eyes: EOM and lids are normal. Pupils are equal, round, and reactive to light.  Neck: Normal range of motion. Neck supple. Carotid bruit is not present.  Cardiovascular: Normal rate, regular rhythm, normal heart sounds, intact distal pulses and normal pulses.   Pulmonary/Chest: Breath sounds normal. No respiratory distress.  Abdominal: Soft. Bowel sounds are normal. There is no tenderness. There is no guarding.  Musculoskeletal: Normal range of motion.  There is paraspinal tenderness of the mid to lower thoracic area and the upper lumbar area to palpation. There is pain of the lumbar area with attempted range of motion in change of position. There no hot areas appreciated. There is no palpable step off of the thoracic or the lumbar spine area.  There is soreness of the medial aspect of the left knee. No hot areas appreciated.  Lymphadenopathy:       Head (right side): No submandibular adenopathy present.       Head (left side): No submandibular adenopathy present.    She has no cervical adenopathy.  Neurological: She is alert and oriented to person, place, and time. She has normal strength. No cranial nerve deficit or sensory deficit.  No gross motor or sensory deficits appreciated on examination of the lower extremities.  Skin: Skin is warm and dry.  Psychiatric: She has a normal mood and affect. Her speech is normal.  Nursing note and vitals reviewed.   ED Course  Procedures (including critical care time) Labs Review Labs Reviewed - No data to display  Imaging Review No results found.   EKG Interpretation None      MDM  The examination favors muscle strain involving the lower back. There is also suggestion of strain of the left knee. No gross neurologic deficits appreciated  at this time. I initially suggested the patient see her primary physician for assistance with pain control as no acute deficits were noted on examination. The patient states that her physician cannot treat her for pain, and suggested that she come to the emergency department. The patient will be treated with Robaxin and Tylenol codeine. Patient states she has an appointment with a specialist at the [facility] next week.    Final diagnoses:  None    **I have reviewed nursing notes, vital signs, and all appropriate lab and imaging results for this patient.Kathie Dike, PA-C 10/07/14 2124  Gilda Crease, MD 10/07/14 2137

## 2014-11-28 ENCOUNTER — Emergency Department (HOSPITAL_COMMUNITY)
Admission: EM | Admit: 2014-11-28 | Discharge: 2014-11-28 | Disposition: A | Discharge status: Home / Self Care | Payer: Self-pay | Attending: Emergency Medicine | Admitting: Emergency Medicine

## 2014-11-28 ENCOUNTER — Encounter (HOSPITAL_COMMUNITY): Payer: Self-pay

## 2014-11-28 DIAGNOSIS — I10 Essential (primary) hypertension: Secondary | ICD-10-CM | POA: Insufficient documentation

## 2014-11-28 DIAGNOSIS — Z8659 Personal history of other mental and behavioral disorders: Secondary | ICD-10-CM | POA: Insufficient documentation

## 2014-11-28 DIAGNOSIS — E119 Type 2 diabetes mellitus without complications: Secondary | ICD-10-CM | POA: Insufficient documentation

## 2014-11-28 DIAGNOSIS — Z880 Allergy status to penicillin: Secondary | ICD-10-CM | POA: Insufficient documentation

## 2014-11-28 DIAGNOSIS — Z791 Long term (current) use of non-steroidal anti-inflammatories (NSAID): Secondary | ICD-10-CM | POA: Insufficient documentation

## 2014-11-28 DIAGNOSIS — Z8639 Personal history of other endocrine, nutritional and metabolic disease: Secondary | ICD-10-CM | POA: Insufficient documentation

## 2014-11-28 DIAGNOSIS — Z79899 Other long term (current) drug therapy: Secondary | ICD-10-CM | POA: Insufficient documentation

## 2014-11-28 DIAGNOSIS — M546 Pain in thoracic spine: Secondary | ICD-10-CM | POA: Insufficient documentation

## 2014-11-28 DIAGNOSIS — M5440 Lumbago with sciatica, unspecified side: Secondary | ICD-10-CM | POA: Insufficient documentation

## 2014-11-28 DIAGNOSIS — Z720 Tobacco use: Secondary | ICD-10-CM | POA: Insufficient documentation

## 2014-11-28 MED ORDER — HYDROCODONE-ACETAMINOPHEN 5-325 MG PO TABS: ORAL_TABLET | ORAL | Status: DC

## 2014-11-28 MED ORDER — METHOCARBAMOL 500 MG PO TABS: 500.0000 mg | ORAL_TABLET | Freq: Three times a day (TID) | ORAL | Status: DC

## 2014-11-28 MED ORDER — HYDROCODONE-ACETAMINOPHEN 5-325 MG PO TABS
1.0000 | ORAL_TABLET | Freq: Once | ORAL | Status: AC
  Administered 2014-11-28: 1 via ORAL
  Filled 2014-11-28: qty 1

## 2014-11-28 MED ORDER — METHOCARBAMOL 500 MG PO TABS
500.0000 mg | ORAL_TABLET | Freq: Once | ORAL | Status: AC
  Administered 2014-11-28: 500 mg via ORAL
  Filled 2014-11-28: qty 1

## 2014-11-28 NOTE — ED Notes (Signed)
Pain upper back and neck w/o known injury

## 2014-11-28 NOTE — Discharge Instructions (Signed)
Back Pain, Adult °Back pain is very common. The pain often gets better over time. The cause of back pain is usually not dangerous. Most people can learn to manage their back pain on their own.  °HOME CARE  °· Stay active. Start with short walks on flat ground if you can. Try to walk farther each day. °· Do not sit, drive, or stand in one place for more than 30 minutes. Do not stay in bed. °· Do not avoid exercise or work. Activity can help your back heal faster. °· Be careful when you bend or lift an object. Bend at your knees, keep the object close to you, and do not twist. °· Sleep on a firm mattress. Lie on your side, and bend your knees. If you lie on your back, put a pillow under your knees. °· Only take medicines as told by your doctor. °· Put ice on the injured area. °¨ Put ice in a plastic bag. °¨ Place a towel between your skin and the bag. °¨ Leave the ice on for 15-20 minutes, 03-04 times a day for the first 2 to 3 days. After that, you can switch between ice and heat packs. °· Ask your doctor about back exercises or massage. °· Avoid feeling anxious or stressed. Find good ways to deal with stress, such as exercise. °GET HELP RIGHT AWAY IF:  °· Your pain does not go away with rest or medicine. °· Your pain does not go away in 1 week. °· You have new problems. °· You do not feel well. °· The pain spreads into your legs. °· You cannot control when you poop (bowel movement) or pee (urinate). °· Your arms or legs feel weak or lose feeling (numbness). °· You feel sick to your stomach (nauseous) or throw up (vomit). °· You have belly (abdominal) pain. °· You feel like you may pass out (faint). °MAKE SURE YOU:  °· Understand these instructions. °· Will watch your condition. °· Will get help right away if you are not doing well or get worse. °Document Released: 02/27/2008 Document Revised: 12/03/2011 Document Reviewed: 01/12/2014 °[employer]® Patient Information ©2015 [employer]. This information is not intended  to replace advice given to you by your health care provider. Make sure you discuss any questions you have with your health care provider. ° °

## 2014-11-30 NOTE — ED Provider Notes (Signed)
CSN: [id number]     Arrival date & time 11/28/14  2132 History   First MD Initiated Contact with Patient 11/28/14 2146     Chief Complaint  Patient presents with  . Back Pain     (Consider location/radiation/quality/duration/timing/severity/associated sxs/prior Treatment) HPI  Melissa Huynh is a 54 y.o. female who presents to the Emergency Department complaining of upper and lower back pain for 3-4 days.  She states the pain is similar to previous and she describes aching pain that radiates from her upper back down to her lower back.  She states the pain is wore with walking and leaning back and standing completely erect.  She has tried OTC analgesics without relief.  She denies fever, chills, abdominal pain, CP , shortness of breath, urine or bowel changes, numbness or weakness of the extremities.  She states that she has recently received her disability approval and plans to arrange an appt with a "back specialist."  Past Medical History  Diagnosis Date  . Hypertension   . High cholesterol   . Anxiety   . Diabetes mellitus without complication   . Back pain    Past Surgical History  Procedure Laterality Date  . Cholecystectomy    . Tonsillectomy    . Cesarean section    . Tubal ligation     Family History  Problem Relation Age of Onset  . Hypertension Mother   . Hypertension Sister    History  Substance Use Topics  . Smoking status: Current Every Day Smoker -- 0.50 packs/day    Types: Cigarettes  . Smokeless tobacco: Not on file  . Alcohol Use: No   OB History    Gravida Para Term Preterm AB TAB SAB Ectopic Multiple Living   Review of Systems  Constitutional: Negative for fever.  Respiratory: Negative for shortness of breath.   Cardiovascular: Negative for chest pain.  Gastrointestinal: Negative for vomiting, abdominal pain and constipation.  Genitourinary: Negative for dysuria, hematuria, flank pain, decreased urine volume and difficulty  urinating.  Musculoskeletal: Positive for back pain. Negative for joint swelling.  Skin: Negative for rash.  Neurological: Negative for weakness and numbness.  All other systems reviewed and are negative.     Allergies  Cheese; Flexeril; Penicillins; Toradol; and Tramadol  Home Medications   Prior to Admission medications   Medication Sig Start Date End Date Taking? Authorizing Provider  acetaminophen-codeine (TYLENOL #3) 300-30 MG per tablet Take 1-2 tablets by mouth every 6 (six) hours as needed for moderate pain. 10/07/14   Ivery Quale, PA-C  diclofenac (VOLTAREN) 75 MG EC tablet Take 1 tablet (75 mg total) by mouth 2 (two) times daily. 09/06/14   Hope Orlene Och, NP  HYDROcodone-acetaminophen (NORCO/VICODIN) 5-325 MG per tablet Take one-two tabs po q 4-6 hrs prn pain 11/28/14   Tammi Teruo Stilley, PA-C  ibuprofen (ADVIL,MOTRIN) 800 MG tablet Take 1 tablet (800 mg total) by mouth every 8 (eight) hours as needed for mild pain. 06/24/14   Kristen N Ward, DO  lisinopril-hydrochlorothiazide (PRINZIDE,ZESTORETIC) 20-12.5 MG per tablet Take 1 tablet by mouth 2 (two) times daily.    Historical Provider, MD  metFORMIN (GLUCOPHAGE) 500 MG tablet Take 500 mg by mouth 2 (two) times daily with a meal.    Historical Provider, MD  methocarbamol (ROBAXIN) 500 MG tablet Take 1 tablet ([id number]) by mouth 3 (three) times daily. 11/28/14   Tammi Yadira Hada, PA-C  metoprolol  tartrate (LOPRESSOR) 25 MG tablet Take 25 mg by mouth 2 (two) times daily.    Historical Provider, MD   BP 136/81 mmHg  Pulse 80  Temp(Src) 97.6 F (36.4 C) (Oral)  Resp 20  Ht 6' (1.829 m)  Wt 206 lb 4.8 oz (93.577 kg)  BMI 27.97 kg/m2  SpO2 100%  LMP 01/18/2014 Physical Exam  Constitutional: She is oriented to person, place, and time. She appears well-developed and well-nourished. No distress.  HENT:  Head: Normocephalic and atraumatic.  Neck: Normal range of motion. Neck supple.  Cardiovascular: Normal rate, regular rhythm,  normal heart sounds and intact distal pulses.   No murmur heard. Pulmonary/Chest: Effort normal and breath sounds normal. No respiratory distress.  Abdominal: Soft. She exhibits no distension. There is no tenderness. There is no rebound and no guarding.  Musculoskeletal: She exhibits tenderness. She exhibits no edema.       Lumbar back: She exhibits tenderness and pain. She exhibits normal range of motion, no swelling, no deformity, no laceration and normal pulse.  Diffuse ttp of the upper thoracic to the  lumbar spine and paraspinal muscles.  Dp pulses are brisk and symmetrical.  Distal sensation intact.  Hip Flexors/Extensors are intact.  Pt has 5/5 strength against resistance of bilateral lower extremities.     Neurological: She is alert and oriented to person, place, and time. She has normal strength. No sensory deficit. She exhibits normal muscle tone. Coordination and gait normal.  Reflex Scores:      Patellar reflexes are 2+ on the right side and 2+ on the left side.      Achilles reflexes are 2+ on the right side and 2+ on the left side. Skin: Skin is warm and dry. No rash noted.  Nursing note and vitals reviewed.   ED Course  Procedures (including critical care time) Labs Review Labs Reviewed - No data to display  Imaging Review No results found.   EKG Interpretation None      MDM   Final diagnoses:  Bilateral thoracic back pain  Midline low back pain with sciatica, sciatica laterality unspecified    Pt with acute on chronic upper and lower back pain.  Pt ambulates in the dept with a steady gait.  No focal neuro deficits.  No concerning sx's for emergent neurological or infectious process.  Vitals are stable and pt is well appearing.  Sx's are similar to previous.  No concerning sx's for aneurysm.  Pt advised to arrange f/u with PMD.    Severiano Gilbertammi Jovontae Banko, PA-C 11/30/14 2348  Bethann BerkshireJoseph Zammit, MD 12/01/14 [phone]

## 2014-12-08 ENCOUNTER — Emergency Department (HOSPITAL_COMMUNITY)
Admission: EM | Admit: 2014-12-08 | Discharge: 2014-12-08 | Disposition: A | Discharge status: Home / Self Care | Payer: Self-pay | Attending: Emergency Medicine | Admitting: Emergency Medicine

## 2014-12-08 ENCOUNTER — Emergency Department (HOSPITAL_COMMUNITY): Payer: Self-pay

## 2014-12-08 ENCOUNTER — Encounter (HOSPITAL_COMMUNITY): Payer: Self-pay | Admitting: *Deleted

## 2014-12-08 DIAGNOSIS — M549 Dorsalgia, unspecified: Secondary | ICD-10-CM

## 2014-12-08 DIAGNOSIS — G8929 Other chronic pain: Secondary | ICD-10-CM | POA: Insufficient documentation

## 2014-12-08 DIAGNOSIS — M62838 Other muscle spasm: Secondary | ICD-10-CM | POA: Insufficient documentation

## 2014-12-08 DIAGNOSIS — E119 Type 2 diabetes mellitus without complications: Secondary | ICD-10-CM | POA: Insufficient documentation

## 2014-12-08 DIAGNOSIS — Z720 Tobacco use: Secondary | ICD-10-CM | POA: Insufficient documentation

## 2014-12-08 DIAGNOSIS — F419 Anxiety disorder, unspecified: Secondary | ICD-10-CM | POA: Insufficient documentation

## 2014-12-08 DIAGNOSIS — M47814 Spondylosis without myelopathy or radiculopathy, thoracic region: Secondary | ICD-10-CM

## 2014-12-08 DIAGNOSIS — Z880 Allergy status to penicillin: Secondary | ICD-10-CM | POA: Insufficient documentation

## 2014-12-08 DIAGNOSIS — M4694 Unspecified inflammatory spondylopathy, thoracic region: Secondary | ICD-10-CM | POA: Insufficient documentation

## 2014-12-08 DIAGNOSIS — Z79899 Other long term (current) drug therapy: Secondary | ICD-10-CM | POA: Insufficient documentation

## 2014-12-08 DIAGNOSIS — I10 Essential (primary) hypertension: Secondary | ICD-10-CM | POA: Insufficient documentation

## 2014-12-08 MED ORDER — METHOCARBAMOL 500 MG PO TABS: 500.0000 mg | ORAL_TABLET | Freq: Two times a day (BID) | ORAL | Status: DC

## 2014-12-08 MED ORDER — HYDROCODONE-ACETAMINOPHEN 5-325 MG PO TABS
1.0000 | ORAL_TABLET | Freq: Once | ORAL | Status: AC
Start: 2014-12-08 — End: 2014-12-08
  Administered 2014-12-08: 1 via ORAL
  Filled 2014-12-08: qty 1

## 2014-12-08 NOTE — ED Notes (Signed)
MD at bedside. 

## 2014-12-08 NOTE — Discharge Instructions (Signed)
Your x-ray today shows that you have osteoarthritis. Keep your appointment with the doctor for your back. Take the medication as directed.

## 2014-12-08 NOTE — ED Notes (Signed)
Upper back and neck pain, for 2 weeks.  No known injury

## 2014-12-08 NOTE — ED Provider Notes (Signed)
CSN: [id number]     Arrival date & time 12/08/14  1119 History   First MD Initiated Contact with Patient 12/08/14 1130     Chief Complaint  Patient presents with  . Back Pain     (Consider location/radiation/quality/duration/timing/severity/associated sxs/prior Treatment) Patient is a 54 y.o. female presenting with back pain. The history is provided by the patient.  Back Pain Location:  Thoracic spine Quality:  Aching and shooting Radiates to:  Does not radiate Pain severity:  Severe Pain is:  Same all the time Onset quality:  Gradual Duration:  2 weeks Timing:  Constant Progression:  Worsening Chronicity:  New Relieved by:  Nothing Worsened by:  Ambulation, movement, touching and twisting Ineffective treatments:  Ibuprofen and muscle relaxants Associated symptoms: no bladder incontinence and no bowel incontinence    Melissa Huynh is a 54 y.o. female who presents to the ED with upper back pain. She states that this is different from her usual low back pain that she has due to DDD. She does not remember any injury to the area. Sometimes the pain goes up to her neck. Patient was referred to Dr. Jordan LikesPool, Neurosurgery but did not got due to lack of insurance. She states she did make an appointment with a doctor in [place]. She was also scheduled for an MRI of her back 3/11 but did not go for the appointment.  Past Medical History  Diagnosis Date  . Hypertension   . High cholesterol   . Anxiety   . Diabetes mellitus without complication   . Back pain    Past Surgical History  Procedure Laterality Date  . Cholecystectomy    . Tonsillectomy    . Cesarean section    . Tubal ligation     Family History  Problem Relation Age of Onset  . Hypertension Mother   . Hypertension Sister    History  Substance Use Topics  . Smoking status: Current Every Day Smoker -- 0.50 packs/day    Types: Cigarettes  . Smokeless tobacco: Not on file  . Alcohol Use: No   OB History    Gravida Para  Term Preterm AB TAB SAB Ectopic Multiple Living   6 3 3  3  3         Review of Systems  Gastrointestinal: Negative for bowel incontinence.  Genitourinary: Negative for bladder incontinence.  Musculoskeletal: Positive for back pain.      Allergies  Cheese; Flexeril; Penicillins; Toradol; and Tramadol  Home Medications   Prior to Admission medications   Medication Sig Start Date End Date Taking? Authorizing Provider  lisinopril-hydrochlorothiazide (PRINZIDE,ZESTORETIC) 20-12.5 MG per tablet Take 1 tablet by mouth 2 (two) times daily.   Yes Historical Provider, MD  LORazepam (ATIVAN) 0.5 MG tablet Take 0.5 mg by mouth 3 (three) times daily.   Yes Historical Provider, MD  metFORMIN (GLUCOPHAGE) 500 MG tablet Take 500 mg by mouth 2 (two) times daily with a meal.   Yes Historical Provider, MD  metoprolol tartrate (LOPRESSOR) 25 MG tablet Take 25 mg by mouth 2 (two) times daily.   Yes Historical Provider, MD  acetaminophen-codeine (TYLENOL #3) 300-30 MG per tablet Take 1-2 tablets by mouth every 6 (six) hours as needed for moderate pain. Patient not taking: Reported on 12/08/2014 10/07/14   Ivery QualeHobson Bryant, PA-C  diclofenac (VOLTAREN) 75 MG EC tablet Take 1 tablet (75 mg total) by mouth 2 (two) times daily. Patient not taking: Reported on 12/08/2014 09/06/14   Janne NapoleonHope M Kataryna Mcquilkin, NP  HYDROcodone-acetaminophen (  NORCO/VICODIN) 5-325 MG per tablet Take one-two tabs po q 4-6 hrs prn pain Patient not taking: Reported on 12/08/2014 11/28/14   Tammi Triplett, PA-C  ibuprofen (ADVIL,MOTRIN) 800 MG tablet Take 1 tablet (800 mg total) by mouth every 8 (eight) hours as needed for mild pain. Patient not taking: Reported on 12/08/2014 06/24/14   Layla Maw Ward, DO  methocarbamol (ROBAXIN) 500 MG tablet Take 1 tablet (500 mg total) by mouth 3 (three) times daily. 11/28/14   Tammi Triplett, PA-C   BP 134/76 mmHg  Pulse 69  Temp(Src) 97.9 F (36.6 C) (Oral)  Resp 20  Ht 6' (1.829 m)  Wt 206 lb (93.441 kg)  BMI 27.93  kg/m2  SpO2 100%  LMP 01/18/2014 Physical Exam  Constitutional: She is oriented to person, place, and time. She appears well-developed and well-nourished. No distress.  HENT:  Head: Normocephalic and atraumatic.  Right Ear: Tympanic membrane normal.  Left Ear: Tympanic membrane normal.  Nose: Nose normal.  Mouth/Throat: Uvula is midline, oropharynx is clear and moist and mucous membranes are normal.  Eyes: Conjunctivae and EOM are normal.  Neck: Normal range of motion. Neck supple.  No tenderness over cervical spine. Unable to reproduce the pain that she has on the right side of her neck that comes and goes.   Cardiovascular: Normal rate and regular rhythm.   Pulmonary/Chest: Effort normal. No respiratory distress. She has no rales.  Abdominal: Soft. There is no tenderness.  Musculoskeletal: Normal range of motion.       Thoracic back: She exhibits tenderness and spasm. She exhibits no laceration and normal pulse. Decreased range of motion: due to pain.       Back:  Radial pulses equal, grips equal, adequate circulation.   Neurological: She is alert and oriented to person, place, and time. She has normal strength. No cranial nerve deficit or sensory deficit. Gait normal.  Reflex Scores:      Bicep reflexes are 2+ on the right side and 2+ on the left side.      Brachioradialis reflexes are 2+ on the right side and 2+ on the left side.      Patellar reflexes are 2+ on the right side and 2+ on the left side.      Achilles reflexes are 2+ on the right side and 2+ on the left side. Skin: Skin is warm and dry.  Psychiatric: She has a normal mood and affect. Her behavior is normal.  Nursing note and vitals reviewed.   ED Course  Procedures (including critical care time) Labs Review Labs Reviewed - No data to display  Imaging Review Dg Thoracic Spine W/swimmers  12/08/2014   CLINICAL DATA:  Upper dorsalgia for 2 weeks  EXAM: THORACIC SPINE - 2 VIEW + SWIMMERS  COMPARISON:  July 02, 2014  FINDINGS: Frontal, lateral, and swimmer's views were obtained. There is no fracture or spondylolisthesis. There is mild to moderate disc space narrowing at multiple levels. There are multiple anterior osteophytes throughout the thoracic region, stable. No erosive change.  IMPRESSION: Stable multilevel osteoarthritic change. No fracture or spondylolisthesis.   Electronically Signed   By: Bretta Bang III M.D.   On: 12/08/2014 13:15     MDM  54 y.o. female with hx of chronic low back pain and here today with upper back pain. Will treat with muscle relaxant and patient is to follow up with the doctor she has an appointment with in [place] next week. Stable for discharge without focal neuro  deficits and no concern for immediate neuro consult at this time. Discussed with the patient and all questioned fully answered.   Final diagnoses:  Back pain     Janne Napoleon, NP 12/08/14 1339  Benjiman Core, MD 12/08/14 1524

## 2015-02-16 ENCOUNTER — Encounter (HOSPITAL_COMMUNITY): Payer: Self-pay | Admitting: Emergency Medicine

## 2015-02-16 ENCOUNTER — Emergency Department (HOSPITAL_COMMUNITY)
Admission: EM | Admit: 2015-02-16 | Discharge: 2015-02-16 | Disposition: A | Discharge status: Home / Self Care | Payer: Self-pay | Attending: Emergency Medicine | Admitting: Emergency Medicine

## 2015-02-16 DIAGNOSIS — Y9289 Other specified places as the place of occurrence of the external cause: Secondary | ICD-10-CM | POA: Insufficient documentation

## 2015-02-16 DIAGNOSIS — Z79899 Other long term (current) drug therapy: Secondary | ICD-10-CM | POA: Insufficient documentation

## 2015-02-16 DIAGNOSIS — M25562 Pain in left knee: Secondary | ICD-10-CM

## 2015-02-16 DIAGNOSIS — Y998 Other external cause status: Secondary | ICD-10-CM | POA: Insufficient documentation

## 2015-02-16 DIAGNOSIS — Z720 Tobacco use: Secondary | ICD-10-CM | POA: Insufficient documentation

## 2015-02-16 DIAGNOSIS — E119 Type 2 diabetes mellitus without complications: Secondary | ICD-10-CM | POA: Insufficient documentation

## 2015-02-16 DIAGNOSIS — R05 Cough: Secondary | ICD-10-CM | POA: Insufficient documentation

## 2015-02-16 DIAGNOSIS — S8992XA Unspecified injury of left lower leg, initial encounter: Secondary | ICD-10-CM | POA: Insufficient documentation

## 2015-02-16 DIAGNOSIS — F419 Anxiety disorder, unspecified: Secondary | ICD-10-CM | POA: Insufficient documentation

## 2015-02-16 DIAGNOSIS — Y9389 Activity, other specified: Secondary | ICD-10-CM | POA: Insufficient documentation

## 2015-02-16 DIAGNOSIS — X58XXXA Exposure to other specified factors, initial encounter: Secondary | ICD-10-CM | POA: Insufficient documentation

## 2015-02-16 DIAGNOSIS — R059 Cough, unspecified: Secondary | ICD-10-CM

## 2015-02-16 DIAGNOSIS — R0789 Other chest pain: Secondary | ICD-10-CM | POA: Insufficient documentation

## 2015-02-16 DIAGNOSIS — Z880 Allergy status to penicillin: Secondary | ICD-10-CM | POA: Insufficient documentation

## 2015-02-16 DIAGNOSIS — I10 Essential (primary) hypertension: Secondary | ICD-10-CM | POA: Insufficient documentation

## 2015-02-16 MED ORDER — ALBUTEROL SULFATE HFA 108 (90 BASE) MCG/ACT IN AERS
2.0000 | INHALATION_SPRAY | Freq: Once | RESPIRATORY_TRACT | Status: AC
  Administered 2015-02-16: 2 via RESPIRATORY_TRACT
  Filled 2015-02-16: qty 6.7

## 2015-02-16 MED ORDER — GUAIFENESIN-CODEINE 100-10 MG/5ML PO SYRP: 10.0000 mL | ORAL_SOLUTION | Freq: Three times a day (TID) | ORAL | Status: DC | PRN

## 2015-02-16 MED ORDER — HYDROCODONE-ACETAMINOPHEN 5-325 MG PO TABS
1.0000 | ORAL_TABLET | Freq: Once | ORAL | Status: AC
  Administered 2015-02-16: 1 via ORAL
  Filled 2015-02-16: qty 1

## 2015-02-16 MED ORDER — PREDNISONE 10 MG PO TABS: ORAL_TABLET | ORAL | Status: DC

## 2015-02-16 NOTE — ED Notes (Signed)
Pt reports cough x1 week. Pt denies any n/v/d/fevers. Pt also reports left knee pain after hearing knee "pop" a few days ago. No obvious deformity noted.

## 2015-02-16 NOTE — Discharge Instructions (Signed)
Cough, Adult  A cough is a reflex. It helps you clear your throat and airways. A cough can help heal your body. A cough can last 2 or 3 weeks (acute) or may last more than 8 weeks (chronic). Some common causes of a cough can include an infection, allergy, or a cold. HOME CARE  Only take medicine as told by your doctor.  If given, take your medicines (antibiotics) as told. Finish them even if you start to feel better.  Use a cold steam vaporizer or humidifier in your home. This can help loosen thick spit (secretions).  Sleep so you are almost sitting up (semi-upright). Use pillows to do this. This helps reduce coughing.  Rest as needed.  Stop smoking if you smoke. GET HELP RIGHT AWAY IF:  You have yellowish-white fluid (pus) in your thick spit.  Your cough gets worse.  Your medicine does not reduce coughing, and you are losing sleep.  You cough up blood.  You have trouble breathing.  Your pain gets worse and medicine does not help.  You have a fever. MAKE SURE YOU:   Understand these instructions.  Will watch your condition.  Will get help right away if you are not doing well or get worse. Document Released: 05/24/2011 Document Revised: 01/25/2014 Document Reviewed: 05/24/2011 [facility] Patient Information 2015 [place], [place]. This information is not intended to replace advice given to you by your health care provider. Make sure you discuss any questions you have with your health care provider.  Knee Pain Knee pain can be a result of an injury or other medical conditions. Treatment will depend on the cause of your pain. HOME CARE  Only take medicine as told by your doctor.  Keep a healthy weight. Being overweight can make the knee hurt more.  Stretch before exercising or playing sports.  If there is constant knee pain, change the way you exercise. Ask your doctor for advice.  Make sure shoes fit well. Choose the right shoe for the sport or activity.  Protect your  knees. Wear kneepads if needed.  Rest when you are tired. GET HELP RIGHT AWAY IF:   Your knee pain does not stop.  Your knee pain does not get better.  Your knee joint feels hot to the touch.  You have a fever. MAKE SURE YOU:   Understand these instructions.  Will watch this condition.  Will get help right away if you are not doing well or get worse. Document Released: 12/07/2008 Document Revised: 12/03/2011 Document Reviewed: 12/07/2008 [facility] Patient Information 2015 [place]. This information is not intended to replace advice given to you by your health care provider. Make sure you discuss any questions you have with your health care provider.

## 2015-02-18 NOTE — ED Provider Notes (Signed)
CSN: [id number]     Arrival date & time 02/16/15  1728 History   First MD Initiated Contact with Patient 02/16/15 1745     Chief Complaint  Patient presents with  . Cough     (Consider location/radiation/quality/duration/timing/severity/associated sxs/prior Treatment) HPI   Melissa Huynh is a 54 y.o. female who presents to the Emergency Department complaining of cough for one week.  She also reports having chest congestion.  Cough is worse with lying down and keeping her from sleeping.  She denies shortness of breath, fever, chills, bloody sputum.  She also c/o left knee pain for one week.  Reports hearing a "pop" to her knee and persistnet pain since then.  She denies swelling, numbness or redness of the joint.  Past Medical History  Diagnosis Date  . Hypertension   . High cholesterol   . Anxiety   . Diabetes mellitus without complication   . Back pain    Past Surgical History  Procedure Laterality Date  . Cholecystectomy    . Tonsillectomy    . Cesarean section    . Tubal ligation     Family History  Problem Relation Age of Onset  . Hypertension Mother   . Hypertension Sister    History  Substance Use Topics  . Smoking status: Current Every Day Smoker -- 0.50 packs/day    Types: Cigarettes  . Smokeless tobacco: Not on file  . Alcohol Use: No   OB History    Gravida Para Term Preterm AB TAB SAB Ectopic Multiple Living   Review of Systems  Constitutional: Negative for fever, chills and appetite change.  HENT: Positive for congestion. Negative for sore throat and trouble swallowing.   Respiratory: Positive for cough and chest tightness. Negative for shortness of breath and wheezing.   Cardiovascular: Negative for chest pain.  Gastrointestinal: Negative for nausea, vomiting and abdominal pain.  Genitourinary: Negative for dysuria.  Musculoskeletal: Negative for joint swelling and arthralgias (left knee).  Skin: Negative for rash.  Neurological:  Negative for dizziness, weakness and numbness.  Hematological: Negative for adenopathy.  All other systems reviewed and are negative.     Allergies  Cheese; Flexeril; Penicillins; Toradol; and Tramadol  Home Medications   Prior to Admission medications   Medication Sig Start Date End Date Taking? Authorizing Provider  guaiFENesin-codeine (ROBITUSSIN AC) 100-10 MG/5ML syrup Take 10 mLs by mouth 3 (three) times daily as needed. 02/16/15   Chalmer Zheng, PA-C  lisinopril-hydrochlorothiazide (PRINZIDE,ZESTORETIC) 20-12.5 MG per tablet Take 1 tablet by mouth 2 (two) times daily.    Historical Provider, MD  LORazepam (ATIVAN) 0.5 MG tablet Take 0.5 mg by mouth 3 (three) times daily.    Historical Provider, MD  metFORMIN (GLUCOPHAGE) 500 MG tablet Take 500 mg by mouth 2 (two) times daily with a meal.    Historical Provider, MD  methocarbamol (ROBAXIN) 500 MG tablet Take 1 tablet (500 mg total) by mouth 2 (two) times daily. 12/08/14   Hope Orlene Och, NP  metoprolol tartrate (LOPRESSOR) 25 MG tablet Take 25 mg by mouth 2 (two) times daily.    Historical Provider, MD  predniSONE (DELTASONE) 10 MG tablet Take 6 tablets day one, 5 tablets day two, 4 tablets day three, 3 tablets day four, 2 tablets day five, then 1 tablet day six 02/16/15   Adriane Guglielmo, PA-C   BP 142/75 mmHg  Pulse 68  Temp(Src) 98 F (36.7 C) (  Oral)  Resp 20  Ht 6' (1.829 m)  Wt 200 lb (90.719 kg)  BMI 27.12 kg/m2  SpO2 98%  LMP 01/18/2014 Physical Exam  Constitutional: She is oriented to person, place, and time. She appears well-developed and well-nourished. No distress.  HENT:  Head: Normocephalic and atraumatic.  Right Ear: Tympanic membrane and ear canal normal.  Left Ear: Tympanic membrane and ear canal normal.  Mouth/Throat: Uvula is midline, oropharynx is clear and moist and mucous membranes are normal. No oropharyngeal exudate.  Eyes: EOM are normal. Pupils are equal, round, and reactive to light.  Neck: Normal  range of motion, full passive range of motion without pain and phonation normal. Neck supple.  Cardiovascular: Normal rate, regular rhythm, normal heart sounds and intact distal pulses.   No murmur heard. Pulmonary/Chest: Effort normal. No stridor. No respiratory distress. She has no rales. She exhibits no tenderness.  Coarse lungs sounds bilaterally.  No rales  Musculoskeletal: She exhibits no edema.  Pt ahs full ROM of the left knee.  No erythema, edema or patellar crepitus.  No distal tenderness or edema  Lymphadenopathy:    She has no cervical adenopathy.  Neurological: She is alert and oriented to person, place, and time. She exhibits normal muscle tone. Coordination normal.  Skin: Skin is warm and dry.  Nursing note and vitals reviewed.   ED Course  Procedures (including critical care time) Labs Review Labs Reviewed - No data to display  Imaging Review No results found.   EKG Interpretation None      MDM   Final diagnoses:  Cough  Knee pain, left    Pt is non-toxic appearing, vitals stable.  Knee pain w/o concerning sx's for septic joint.  No sx's to indicate need for imaging  Pt agrees to symptomatic tx and PMD f/u if needed    Pauline Ausammy Niveah Boerner, PA-C 02/18/15 2331  Donnetta HutchingBrian Cook, MD 02/19/15 [id number]

## 2015-02-28 IMAGING — CR DG THORACIC SPINE 2V
3 series · 3 of 3 positions shown · non-contrast
Comparison: 11/17/2013

CLINICAL DATA: Back pain status post fall

EXAM:
THORACIC SPINE - 2 VIEW

[t t-spine a.p. *]
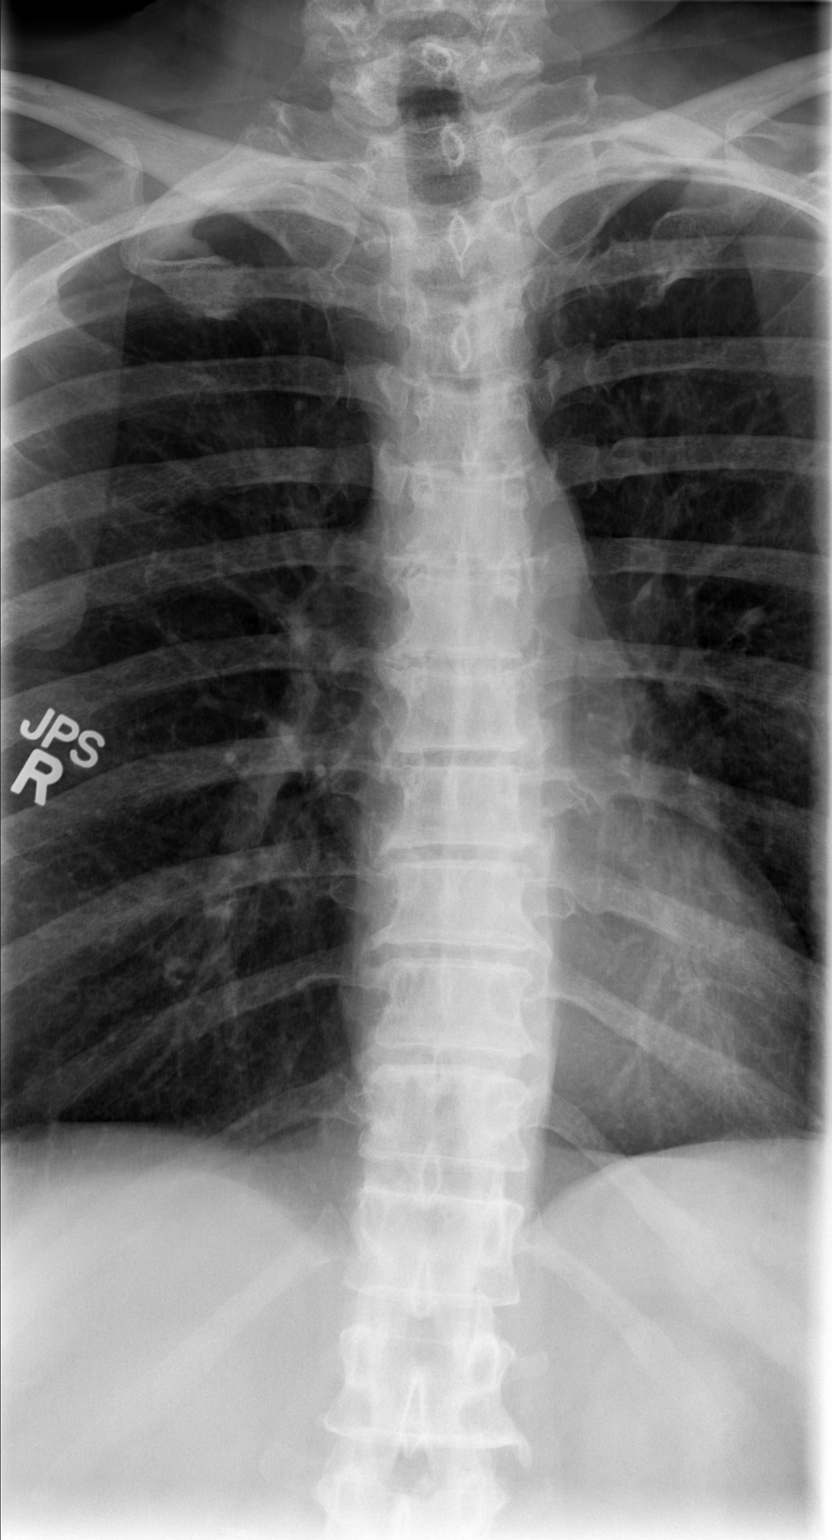

[t t-spine lat *]
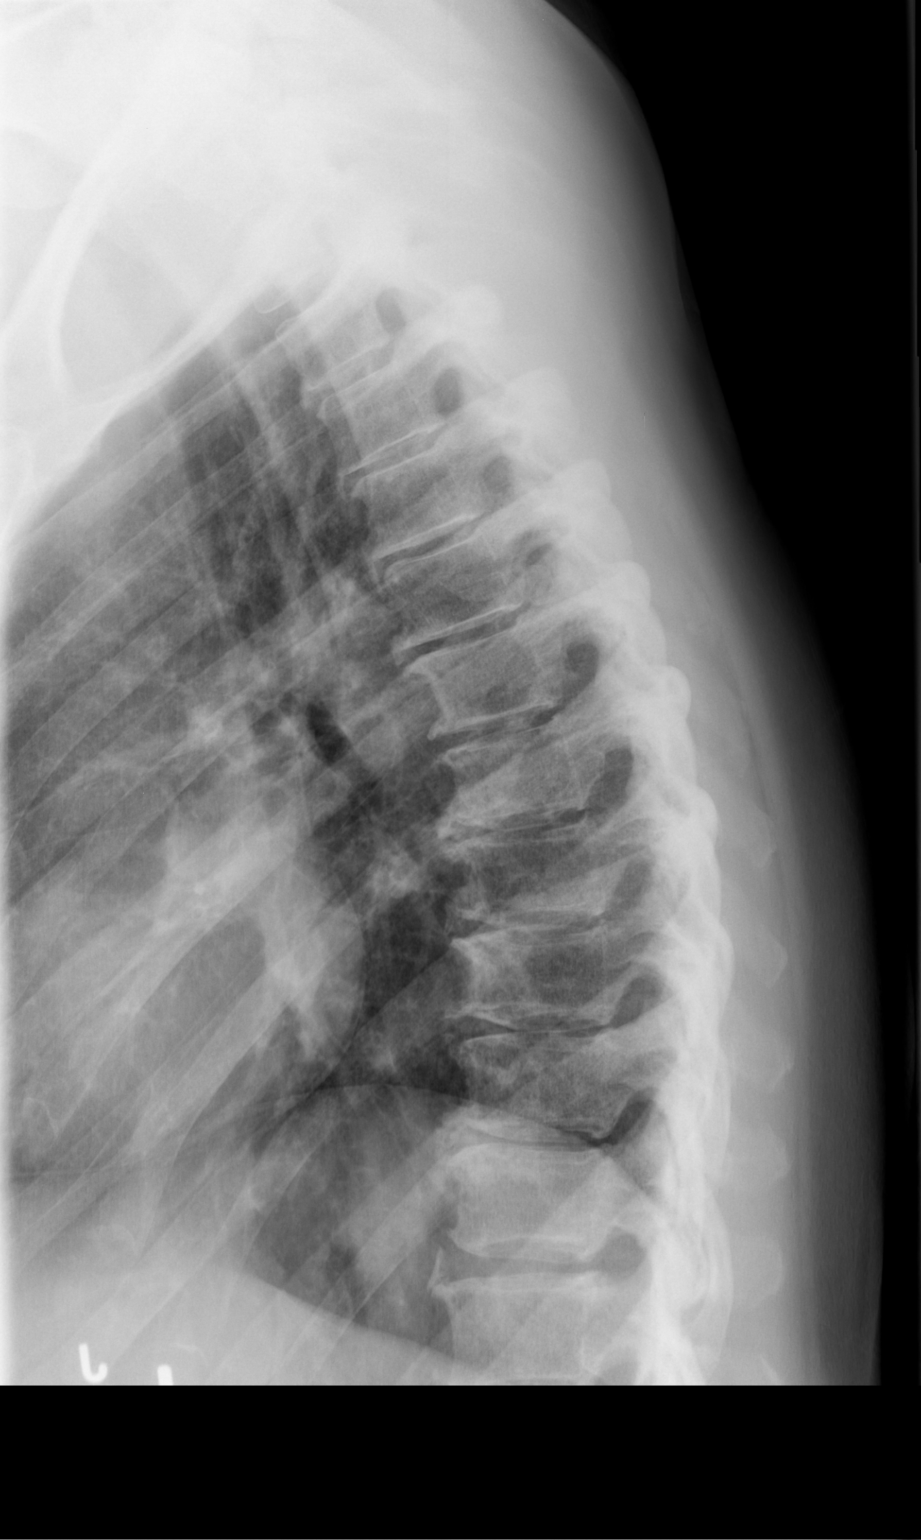

[t swimmers *]
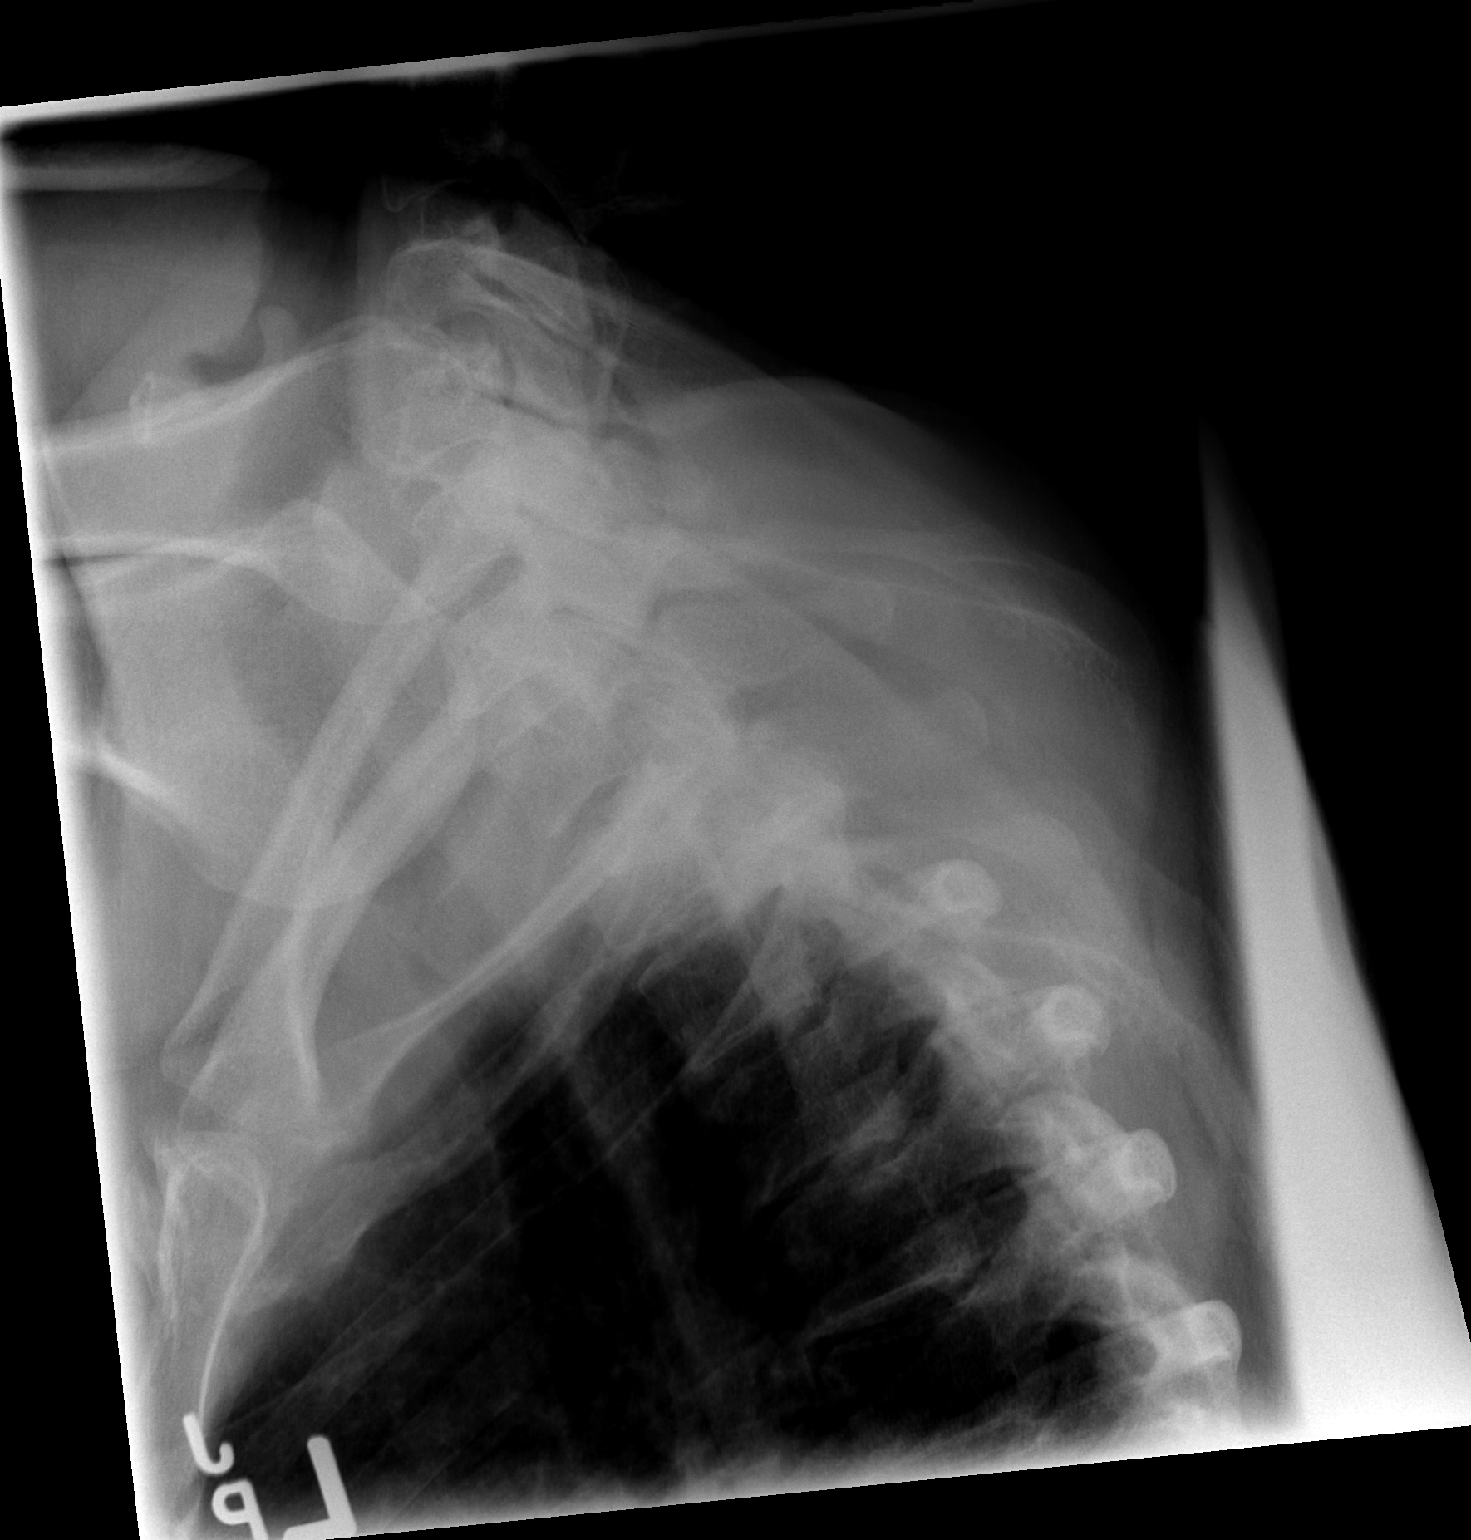

[3 of 3 positions shown; findings below may reference images not displayed]

FINDINGS: There is no evidence of thoracic spine fracture. Alignment is
normal. There is mild thoracic spine spondylosis. No other
significant bone abnormalities are identified.
IMPRESSION: No acute osseous injury of the thoracic spine.

## 2015-03-08 ENCOUNTER — Emergency Department (HOSPITAL_COMMUNITY): Payer: Self-pay

## 2015-03-08 ENCOUNTER — Encounter (HOSPITAL_COMMUNITY): Payer: Self-pay | Admitting: Emergency Medicine

## 2015-03-08 ENCOUNTER — Emergency Department (HOSPITAL_COMMUNITY)
Admission: EM | Admit: 2015-03-08 | Discharge: 2015-03-08 | Disposition: A | Discharge status: Home / Self Care | Payer: Self-pay | Attending: Emergency Medicine | Admitting: Emergency Medicine

## 2015-03-08 DIAGNOSIS — W208XXA Other cause of strike by thrown, projected or falling object, initial encounter: Secondary | ICD-10-CM | POA: Insufficient documentation

## 2015-03-08 DIAGNOSIS — E119 Type 2 diabetes mellitus without complications: Secondary | ICD-10-CM | POA: Insufficient documentation

## 2015-03-08 DIAGNOSIS — Y998 Other external cause status: Secondary | ICD-10-CM | POA: Insufficient documentation

## 2015-03-08 DIAGNOSIS — F419 Anxiety disorder, unspecified: Secondary | ICD-10-CM | POA: Insufficient documentation

## 2015-03-08 DIAGNOSIS — Y9389 Activity, other specified: Secondary | ICD-10-CM | POA: Insufficient documentation

## 2015-03-08 DIAGNOSIS — Z79899 Other long term (current) drug therapy: Secondary | ICD-10-CM | POA: Insufficient documentation

## 2015-03-08 DIAGNOSIS — I10 Essential (primary) hypertension: Secondary | ICD-10-CM | POA: Insufficient documentation

## 2015-03-08 DIAGNOSIS — Z880 Allergy status to penicillin: Secondary | ICD-10-CM | POA: Insufficient documentation

## 2015-03-08 DIAGNOSIS — S9032XA Contusion of left foot, initial encounter: Secondary | ICD-10-CM | POA: Insufficient documentation

## 2015-03-08 DIAGNOSIS — Y9289 Other specified places as the place of occurrence of the external cause: Secondary | ICD-10-CM | POA: Insufficient documentation

## 2015-03-08 DIAGNOSIS — Z720 Tobacco use: Secondary | ICD-10-CM | POA: Insufficient documentation

## 2015-03-08 DIAGNOSIS — M549 Dorsalgia, unspecified: Secondary | ICD-10-CM | POA: Insufficient documentation

## 2015-03-08 MED ORDER — ACETAMINOPHEN-CODEINE #3 300-30 MG PO TABS: 1.0000 | ORAL_TABLET | Freq: Four times a day (QID) | ORAL | Status: DC | PRN

## 2015-03-08 MED ORDER — DICLOFENAC SODIUM 75 MG PO TBEC: 75.0000 mg | DELAYED_RELEASE_TABLET | Freq: Two times a day (BID) | ORAL | Status: DC

## 2015-03-08 NOTE — Discharge Instructions (Signed)
Foot Contusion  YOUR XRAY IS NEGATIVE FOR FRACTURE OR DISLOCATION OR EMERGENT FINDINGS. PLEASE APPLY ICE. PLEASE KEEP FOOT ELEVATED. USE CRUTCHES UNTIL YOU CAN SAFELY APPLY WEIGHT TO THE LEFT FOOT. TYLENOL CODEINE MAY CAUSE DROWSINESS, USE WITH CAUTION. A foot contusion is a deep bruise to the foot. Contusions happen when an injury causes bleeding under the skin. Signs of bruising include pain, puffiness (swelling), and discolored skin. The contusion may turn blue, purple, or yellow. HOME CARE  Put ice on the injured area.  Put ice in a plastic bag.  Place a towel between your skin and the bag.  Leave the ice on for 15-20 minutes, 03-04 times a day.  Only take medicines as told by your doctor.  Use an elastic wrap only as told. You may remove the wrap for sleeping, showering, and bathing. Take the wrap off if you lose feeling (numb) in your toes, or they turn blue or cold. Put the wrap on more loosely.  Keep the foot raised (elevated) with pillows.  If your foot hurts, avoid standing or walking.  When your doctor says it is okay to use your foot, start using it slowly. If you have pain, lessen how much you use your foot.  See your doctor as told. GET HELP RIGHT AWAY IF:   You have more redness, puffiness, or pain in your foot.  Your puffiness or pain does not get better with medicine.  You lose feeling in your foot, or you cannot move your toes.  Your foot turns cold or blue.  You have pain when you move your toes.  Your foot feels warm.  Your contusion does not get better in 2 days. MAKE SURE YOU:   Understand these instructions.  Will watch this condition.  Will get help right away if you or your child is not doing well or gets worse. Document Released: 06/19/2008 Document Revised: 03/11/2012 Document Reviewed: 08/14/2011 [facility] Patient Information 2015 [place], [place]. This information is not intended to replace advice given to you by your health care provider. Make  sure you discuss any questions you have with your health care provider.

## 2015-03-08 NOTE — ED Provider Notes (Signed)
CSN: [id number]     Arrival date & time 03/08/15  1501 History   First MD Initiated Contact with Patient 03/08/15 1535     Chief Complaint  Patient presents with  . Foot Pain     (Consider location/radiation/quality/duration/timing/severity/associated sxs/prior Treatment) Patient is a 54 y.o. female presenting with lower extremity pain. The history is provided by the patient.  Foot Pain This is a new problem. The current episode started in the past 7 days. The problem occurs intermittently. The problem has been gradually worsening. Associated symptoms include arthralgias. Pertinent negatives include no numbness or weakness. The symptoms are aggravated by standing and walking. She has tried rest (ELEVATION, TYLENOL) for the symptoms. The treatment provided no relief.    Past Medical History  Diagnosis Date  . Hypertension   . High cholesterol   . Anxiety   . Diabetes mellitus without complication   . Back pain    Past Surgical History  Procedure Laterality Date  . Cholecystectomy    . Tonsillectomy    . Cesarean section    . Tubal ligation     Family History  Problem Relation Age of Onset  . Hypertension Mother   . Hypertension Sister    History  Substance Use Topics  . Smoking status: Current Every Day Smoker -- 0.50 packs/day    Types: Cigarettes  . Smokeless tobacco: Not on file  . Alcohol Use: No   OB History    Gravida Para Term Preterm AB TAB SAB Ectopic Multiple Living   6 3 3  3  3         Review of Systems  Musculoskeletal: Positive for back pain and arthralgias.  Neurological: Negative for weakness and numbness.  Psychiatric/Behavioral: The patient is nervous/anxious.   All other systems reviewed and are negative.     Allergies  Flexeril; Penicillins; Cheese; Toradol; and Tramadol  Home Medications   Prior to Admission medications   Medication Sig Start Date End Date Taking? Authorizing Provider  acetaminophen-codeine (TYLENOL #3) 300-30 MG per  tablet Take 1-2 tablets by mouth every 6 (six) hours as needed. 03/08/15   Ivery Quale, PA-C  diclofenac (VOLTAREN) 75 MG EC tablet Take 1 tablet (75 mg total) by mouth 2 (two) times daily. 03/08/15   Ivery Quale, PA-C  guaiFENesin-codeine (ROBITUSSIN AC) 100-10 MG/5ML syrup Take 10 mLs by mouth 3 (three) times daily as needed. 02/16/15   Tammy Triplett, PA-C  lisinopril-hydrochlorothiazide (PRINZIDE,ZESTORETIC) 20-12.5 MG per tablet Take 1 tablet by mouth 2 (two) times daily.    Historical Provider, MD  LORazepam (ATIVAN) 0.5 MG tablet Take 0.5 mg by mouth 3 (three) times daily.    Historical Provider, MD  metFORMIN (GLUCOPHAGE) 500 MG tablet Take 500 mg by mouth 2 (two) times daily with a meal.    Historical Provider, MD  methocarbamol (ROBAXIN) 500 MG tablet Take 1 tablet (500 mg total) by mouth 2 (two) times daily. 12/08/14   Hope Orlene Och, NP  metoprolol tartrate (LOPRESSOR) 25 MG tablet Take 25 mg by mouth 2 (two) times daily.    Historical Provider, MD  predniSONE (DELTASONE) 10 MG tablet Take 6 tablets day one, 5 tablets day two, 4 tablets day three, 3 tablets day four, 2 tablets day five, then 1 tablet day six 02/16/15   Tammy Triplett, PA-C   BP 134/61 mmHg  Pulse 89  Temp(Src) 97.5 F (36.4 C) (Oral)  Resp 19  Ht 6' (1.829 m)  Wt 202 lb (91.627 kg)  BMI  27.39 kg/m2  SpO2 98%  LMP 01/18/2014 Physical Exam  Constitutional: She is oriented to person, place, and time. She appears well-developed and well-nourished.  Non-toxic appearance.  HENT:  Head: Normocephalic.  Right Ear: Tympanic membrane and external ear normal.  Left Ear: Tympanic membrane and external ear normal.  Eyes: EOM and lids are normal. Pupils are equal, round, and reactive to light.  Neck: Normal range of motion. Neck supple. Carotid bruit is not present.  Cardiovascular: Normal rate, regular rhythm, normal heart sounds, intact distal pulses and normal pulses.   Pulmonary/Chest: Breath sounds normal. No respiratory  distress.  Abdominal: Soft. Bowel sounds are normal. There is no tenderness. There is no guarding.  Musculoskeletal: Normal range of motion.       Left foot: There is tenderness. There is normal capillary refill.       Feet:  Lymphadenopathy:       Head (right side): No submandibular adenopathy present.       Head (left side): No submandibular adenopathy present.    She has no cervical adenopathy.  Neurological: She is alert and oriented to person, place, and time. She has normal strength. No cranial nerve deficit or sensory deficit.  Skin: Skin is warm and dry.  Psychiatric: Her speech is normal. Her mood appears anxious.  Nursing note and vitals reviewed.   ED Course  Procedures (including critical care time) Labs Review Labs Reviewed - No data to display  Imaging Review Dg Foot Complete Left  03/08/2015   CLINICAL DATA:  Bruising and pain on the dorsum of the foot after blunt trauma 3 days ago. The patient dropped furniture on her foot.  EXAM: LEFT FOOT - COMPLETE 3+ VIEW  COMPARISON:  None.  FINDINGS: There is no evidence of fracture or dislocation. There is no evidence of arthropathy or other focal bone abnormality. Soft tissues are unremarkable.  IMPRESSION: Normal exam.   Electronically Signed   By: Francene Boyers M.D.   On: 03/08/2015 15:33     EKG Interpretation None      MDM  X-ray of the left foot is negative for fracture or dislocation. There is no circulation compromise appreciated. I reviewed the x-ray with the patient in terms which he understands. Explained no fractures or other neurovascular problems were found on today's exam. Patient states that she cannot see her primary physician, and she is having irregular pain, as well as recent tragedies in her family that she needs to help take care of. She asked for medication to assist her with her discomfort while she takes care of her family members. Pt fitted with crutches and ACE bandage. Prescription for Tylenol  codeine given to the patient.   Final diagnoses:  Contusion, foot, left, initial encounter    **I have reviewed nursing notes, vital signs, and all appropriate lab and imaging results for this patient.Ivery Quale, PA-C 03/08/15 1628  Gilda Crease, MD 03/09/15 [phone]

## 2015-03-08 NOTE — ED Notes (Signed)
Pt reports was helping move furniture on Saturday and reports dropped an end of a couch on left foot. Pt reports pain ever since. Mild swelling noted.

## 2015-03-08 NOTE — ED Notes (Signed)
Elevated LT foot and applied ice.

## 2015-04-04 ENCOUNTER — Emergency Department (HOSPITAL_COMMUNITY)
Admission: EM | Admit: 2015-04-04 | Discharge: 2015-04-04 | Disposition: A | Discharge status: Home / Self Care | Payer: Self-pay | Attending: Emergency Medicine | Admitting: Emergency Medicine

## 2015-04-04 ENCOUNTER — Emergency Department (HOSPITAL_COMMUNITY): Payer: Self-pay

## 2015-04-04 ENCOUNTER — Encounter (HOSPITAL_COMMUNITY): Payer: Self-pay | Admitting: Emergency Medicine

## 2015-04-04 DIAGNOSIS — Z720 Tobacco use: Secondary | ICD-10-CM | POA: Insufficient documentation

## 2015-04-04 DIAGNOSIS — E119 Type 2 diabetes mellitus without complications: Secondary | ICD-10-CM | POA: Insufficient documentation

## 2015-04-04 DIAGNOSIS — Y9389 Activity, other specified: Secondary | ICD-10-CM | POA: Insufficient documentation

## 2015-04-04 DIAGNOSIS — Z79899 Other long term (current) drug therapy: Secondary | ICD-10-CM | POA: Insufficient documentation

## 2015-04-04 DIAGNOSIS — W010XXA Fall on same level from slipping, tripping and stumbling without subsequent striking against object, initial encounter: Secondary | ICD-10-CM | POA: Insufficient documentation

## 2015-04-04 DIAGNOSIS — S8391XA Sprain of unspecified site of right knee, initial encounter: Secondary | ICD-10-CM | POA: Insufficient documentation

## 2015-04-04 DIAGNOSIS — F419 Anxiety disorder, unspecified: Secondary | ICD-10-CM | POA: Insufficient documentation

## 2015-04-04 DIAGNOSIS — Y998 Other external cause status: Secondary | ICD-10-CM | POA: Insufficient documentation

## 2015-04-04 DIAGNOSIS — R52 Pain, unspecified: Secondary | ICD-10-CM

## 2015-04-04 DIAGNOSIS — Z880 Allergy status to penicillin: Secondary | ICD-10-CM | POA: Insufficient documentation

## 2015-04-04 DIAGNOSIS — M25531 Pain in right wrist: Secondary | ICD-10-CM

## 2015-04-04 DIAGNOSIS — S6991XA Unspecified injury of right wrist, hand and finger(s), initial encounter: Secondary | ICD-10-CM | POA: Insufficient documentation

## 2015-04-04 DIAGNOSIS — Y9289 Other specified places as the place of occurrence of the external cause: Secondary | ICD-10-CM | POA: Insufficient documentation

## 2015-04-04 DIAGNOSIS — Z791 Long term (current) use of non-steroidal anti-inflammatories (NSAID): Secondary | ICD-10-CM | POA: Insufficient documentation

## 2015-04-04 DIAGNOSIS — I10 Essential (primary) hypertension: Secondary | ICD-10-CM | POA: Insufficient documentation

## 2015-04-04 MED ORDER — HYDROCODONE-ACETAMINOPHEN 5-325 MG PO TABS: ORAL_TABLET | ORAL | Status: DC

## 2015-04-04 MED ORDER — HYDROCODONE-ACETAMINOPHEN 5-325 MG PO TABS
1.0000 | ORAL_TABLET | Freq: Once | ORAL | Status: AC
Start: 2015-04-04 — End: 2015-04-04
  Administered 2015-04-04: 1 via ORAL
  Filled 2015-04-04: qty 1

## 2015-04-04 NOTE — ED Provider Notes (Signed)
CSN: [id number]     Arrival date & time 04/04/15  1620 History  This chart was scribed for non-physician practitioner Pauline Aus, PA-C working with Glynn Octave, MD by Murriel Hopper, ED Scribe. This patient was seen in room APFT22/APFT22 and the patient's care was started at 5:34 PM.    Chief Complaint  Patient presents with  . Fall      Patient is a 54 y.o. female presenting with fall. The history is provided by the patient. No language interpreter was used.  Fall This is a new problem. The current episode started yesterday. Pertinent negatives include no chest pain, no headaches and no shortness of breath. The symptoms are relieved by rest. She has tried acetaminophen for the symptoms. The treatment provided moderate relief.     HPI Comments: Melissa Huynh is a 54 y.o. female who presents to the Emergency Department complaining of a fall that occurred last night. Pt states that she tripped over a cord plugged into the wall and landed straight on her right knee, and also states she hurt her right wrist when she tried to catch herself while falling down. Pt states it is hard to ambulate with her right knee pain and has associated swelling to the area as well. Pt states she also has a rash on both of her shins that have been present for a few days and began after working outside in the yard. Pt states she has taken Tylenol and Motrin for her symptoms with moderate relief. She denies numbness or weakness of the extremities, head injury, neck or back pain, LOC, and swelling of her knee    Past Medical History  Diagnosis Date  . Hypertension   . High cholesterol   . Anxiety   . Diabetes mellitus without complication   . Back pain    Past Surgical History  Procedure Laterality Date  . Cholecystectomy    . Tonsillectomy    . Cesarean section    . Tubal ligation     Family History  Problem Relation Age of Onset  . Hypertension Mother   . Hypertension Sister    History   Substance Use Topics  . Smoking status: Current Every Day Smoker -- 0.50 packs/day    Types: Cigarettes  . Smokeless tobacco: Not on file  . Alcohol Use: No   OB History    Gravida Para Term Preterm AB TAB SAB Ectopic Multiple Living   Review of Systems  Constitutional: Negative for fever and chills.  Respiratory: Negative for chest tightness and shortness of breath.   Cardiovascular: Negative for chest pain.  Gastrointestinal: Negative for nausea and vomiting.  Musculoskeletal: Positive for arthralgias (right knee and wrist pain). Negative for joint swelling.  Skin: Negative for color change and wound.  Neurological: Negative for dizziness, weakness, numbness and headaches.  All other systems reviewed and are negative.     Allergies  Flexeril; Penicillins; Cheese; Toradol; and Tramadol  Home Medications   Prior to Admission medications   Medication Sig Start Date End Date Taking? Authorizing Provider  acetaminophen-codeine (TYLENOL #3) 300-30 MG per tablet Take 1-2 tablets by mouth every 6 (six) hours as needed. 03/08/15   Ivery Quale, PA-C  diclofenac (VOLTAREN) 75 MG EC tablet Take 1 tablet (75 mg total) by mouth 2 (two) times daily. 03/08/15   Ivery Quale, PA-C  guaiFENesin-codeine (ROBITUSSIN AC) 100-10 MG/5ML syrup Take 10 mLs by mouth 3 (  three) times daily as needed. 02/16/15   Sharniece Gibbon, PA-C  lisinopril-hydrochlorothiazide (PRINZIDE,ZESTORETIC) 20-12.5 MG per tablet Take 1 tablet by mouth 2 (two) times daily.    Historical Provider, MD  LORazepam (ATIVAN) 0.5 MG tablet Take 0.5 mg by mouth 3 (three) times daily.    Historical Provider, MD  metFORMIN (GLUCOPHAGE) 500 MG tablet Take 500 mg by mouth 2 (two) times daily with a meal.    Historical Provider, MD  methocarbamol (ROBAXIN) 500 MG tablet Take 1 tablet (500 mg total) by mouth 2 (two) times daily. 12/08/14   Hope Orlene OchM Neese, NP  metoprolol tartrate (LOPRESSOR) 25 MG tablet Take 25 mg by mouth  2 (two) times daily.    Historical Provider, MD  predniSONE (DELTASONE) 10 MG tablet Take 6 tablets day one, 5 tablets day two, 4 tablets day three, 3 tablets day four, 2 tablets day five, then 1 tablet day six 02/16/15   Milica Gully, PA-C   BP 152/94 mmHg  Pulse 80  Temp(Src) 98.6 F (37 C) (Oral)  Resp 17  Ht 6' (1.829 m)  Wt 188 lb (85.276 kg)  BMI 25.49 kg/m2  SpO2 100%  LMP 01/18/2014 Physical Exam  Constitutional: She is oriented to person, place, and time. She appears well-developed and well-nourished.  HENT:  Head: Normocephalic and atraumatic.  Cardiovascular: Normal rate.   Pulmonary/Chest: Effort normal.  Abdominal: She exhibits no distension.  Musculoskeletal:  Diffuse tenderness to right distal wrist No obvious bone deformity Tenderness and mild edema of anterior right knee No effusion or step-off deformity  DP pulses and radial pulse and sensation intact   Neurological: She is alert and oriented to person, place, and time.  Skin: Skin is warm and dry.  Mild papular erythematous rash to the bilateral lower extremities, neck, and right upper arm No vesicles or pustules   Psychiatric: She has a normal mood and affect.  Nursing note and vitals reviewed.   ED Course  Procedures (including critical care time)  DIAGNOSTIC STUDIES: Oxygen Saturation is 100% on room air, normal by my interpretation.    COORDINATION OF CARE: 5:41 PM Discussed treatment plan with pt at bedside and pt agreed to plan.   Labs Review Labs Reviewed - No data to display  Imaging Review Dg Elbow Complete Right  04/04/2015   CLINICAL DATA:  Fall, right elbow pain  EXAM: RIGHT ELBOW - COMPLETE 3+ VIEW  COMPARISON:  None.  FINDINGS: No fracture or dislocation is seen.  Chronic degenerative changes involving the elbow, possibly posttraumatic.  No displaced elbow joint fat pads to suggest an elbow joint effusion.  Visualized soft tissues are within normal limits.  IMPRESSION: No fracture or  dislocation is seen.  Degenerative changes, possibly posttraumatic.   Electronically Signed   By: Charline BillsSriyesh  Krishnan M.D.   On: 04/04/2015 16:56   Dg Wrist Complete Right  04/04/2015   CLINICAL DATA:  54 year old female with a history of fall and wrist pain  EXAM: RIGHT WRIST - COMPLETE 3+ VIEW  COMPARISON:  None.  FINDINGS: Small bony focus distal to the ulnar styloid, potentially small chip fracture.  No other acute fracture line is identified.  No significant soft tissue swelling.  No radiopaque foreign body.  Unremarkable appearance of the scaphoid.  IMPRESSION: Small bony density distal to the ulnar styloid, potentially a small chip fracture, or alternatively degenerative changes.  Signed,  Yvone NeuJaime S. Loreta AveWagner, DO  [facility]  [facility]   Electronically Signed  By: Gilmer Mor D.O.   On: 04/04/2015 16:56   Dg Knee Complete 4 Views Right  04/04/2015   CLINICAL DATA:  Fall, right knee pain/swelling  EXAM: RIGHT KNEE - COMPLETE 4+ VIEW  COMPARISON:  None.  FINDINGS: No fracture or dislocation is seen.  The joint spaces are preserved.  Visualized soft tissues are within normal limits.  No suprapatellar knee joint effusion.  IMPRESSION: No fracture or dislocation is seen.   Electronically Signed   By: Charline Bills M.D.   On: 04/04/2015 16:55     EKG Interpretation None      MDM   Final diagnoses:  Wrist pain, acute, right  Knee sprain, right, initial encounter   Pt reviewed on the [place] database.  No recent narcotic filed since 03/09/15  Pt is ambulatory, no focal neuro deficits, wrist splint applied.  Remains NV intact.  Possible chip fx of the wrist but more likely related to deg changes.    I personally performed the services described in this documentation, which was scribed in my presence. The recorded information has been reviewed and is accurate.    Pauline Aus, PA-C 04/06/15 1352  Glynn Octave, MD 04/07/15 [phone]

## 2015-04-04 NOTE — Discharge Instructions (Signed)
Knee Pain °Knee pain can be a result of an injury or other medical conditions. Treatment will depend on the cause of your pain. °HOME CARE °· Only take medicine as told by your doctor. °· Keep a healthy weight. Being overweight can make the knee hurt more. °· Stretch before exercising or playing sports. °· If there is constant knee pain, change the way you exercise. Ask your doctor for advice. °· Make sure shoes fit well. Choose the right shoe for the sport or activity. °· Protect your knees. Wear kneepads if needed. °· Rest when you are tired. °GET HELP RIGHT AWAY IF:  °· Your knee pain does not stop. °· Your knee pain does not get better. °· Your knee joint feels hot to the touch. °· You have a fever. °MAKE SURE YOU:  °· Understand these instructions. °· Will watch this condition. °· Will get help right away if you are not doing well or get worse. °Document Released: 12/07/2008 Document Revised: 12/03/2011 Document Reviewed: 12/07/2008 °[employer]® Patient Information ©2015 [employer]. This information is not intended to replace advice given to you by your health care provider. Make sure you discuss any questions you have with your health care provider. ° °

## 2015-04-04 NOTE — ED Notes (Signed)
Pt verbalized understanding of no driving and to use caution within 4 hours of taking pain meds due to meds cause drowsiness 

## 2015-04-04 NOTE — ED Notes (Addendum)
Pt states that she tripped over a fan cord yesterday and fell on right knee and injured right wrist and elbow.  Also c/o rash

## 2015-04-11 ENCOUNTER — Emergency Department (HOSPITAL_COMMUNITY)
Admission: EM | Admit: 2015-04-11 | Discharge: 2015-04-11 | Disposition: A | Discharge status: Home / Self Care | Payer: Self-pay | Attending: Emergency Medicine | Admitting: Emergency Medicine

## 2015-04-11 ENCOUNTER — Encounter (HOSPITAL_COMMUNITY): Payer: Self-pay | Admitting: Emergency Medicine

## 2015-04-11 DIAGNOSIS — I10 Essential (primary) hypertension: Secondary | ICD-10-CM | POA: Insufficient documentation

## 2015-04-11 DIAGNOSIS — G8911 Acute pain due to trauma: Secondary | ICD-10-CM | POA: Insufficient documentation

## 2015-04-11 DIAGNOSIS — M25531 Pain in right wrist: Secondary | ICD-10-CM | POA: Insufficient documentation

## 2015-04-11 DIAGNOSIS — E119 Type 2 diabetes mellitus without complications: Secondary | ICD-10-CM | POA: Insufficient documentation

## 2015-04-11 DIAGNOSIS — Z720 Tobacco use: Secondary | ICD-10-CM | POA: Insufficient documentation

## 2015-04-11 DIAGNOSIS — F419 Anxiety disorder, unspecified: Secondary | ICD-10-CM | POA: Insufficient documentation

## 2015-04-11 DIAGNOSIS — Z880 Allergy status to penicillin: Secondary | ICD-10-CM | POA: Insufficient documentation

## 2015-04-11 DIAGNOSIS — Z79899 Other long term (current) drug therapy: Secondary | ICD-10-CM | POA: Insufficient documentation

## 2015-04-11 HISTORY — DX: Unspecified osteoarthritis, unspecified site: M19.90

## 2015-04-11 MED ORDER — HYDROCODONE-ACETAMINOPHEN 5-325 MG PO TABS: 1.0000 | ORAL_TABLET | ORAL | Status: DC | PRN

## 2015-04-11 MED ORDER — HYDROCODONE-ACETAMINOPHEN 5-325 MG PO TABS
1.0000 | ORAL_TABLET | Freq: Once | ORAL | Status: AC
  Administered 2015-04-11: 1 via ORAL
  Filled 2015-04-11: qty 1

## 2015-04-11 NOTE — Discharge Instructions (Signed)
Musculoskeletal Pain Musculoskeletal pain is muscle and boney aches and pains. These pains can occur in any part of the body. Your caregiver may treat you without knowing the cause of the pain. They may treat you if blood or urine tests, X-rays, and other tests were normal.  CAUSES There is often not a definite cause or reason for these pains. These pains may be caused by a type of germ (virus). The discomfort may also come from overuse. Overuse includes working out too hard when your body is not fit. Boney aches also come from weather changes. Bone is sensitive to atmospheric pressure changes. HOME CARE INSTRUCTIONS   Ask when your test results will be ready. Make sure you get your test results.  Only take over-the-counter or prescription medicines for pain, discomfort, or fever as directed by your caregiver. If you were given medications for your condition, do not drive, operate machinery or power tools, or sign legal documents for 24 hours. Do not drink alcohol. Do not take sleeping pills or other medications that may interfere with treatment.  Continue all activities unless the activities cause more pain. When the pain lessens, slowly resume normal activities. Gradually increase the intensity and duration of the activities or exercise.  During periods of severe pain, bed rest may be helpful. Lay or sit in any position that is comfortable.  Putting ice on the injured area.  Put ice in a bag.  Place a towel between your skin and the bag.  Leave the ice on for 15 to 20 minutes, 3 to 4 times a day.  Follow up with your caregiver for continued problems and no reason can be found for the pain. If the pain becomes worse or does not go away, it may be necessary to repeat tests or do additional testing. Your caregiver may need to look further for a possible cause. SEEK IMMEDIATE MEDICAL CARE IF:  You have pain that is getting worse and is not relieved by medications.  You develop chest pain  that is associated with shortness or breath, sweating, feeling sick to your stomach (nauseous), or throw up (vomit).  Your pain becomes localized to the abdomen.  You develop any new symptoms that seem different or that concern you. MAKE SURE YOU:   Understand these instructions.  Will watch your condition.  Will get help right away if you are not doing well or get worse. Document Released: 09/10/2005 Document Revised: 12/03/2011 Document Reviewed: 05/15/2013 [facility] Patient Information 2015 [place]. This information is not intended to replace advice given to you by your health care provider. Make sure you discuss any questions you have with your health care provider.   You may take the hydrocodone prescribed for pain relief.  This will make you drowsy - do not drive within 4 hours of taking this medication.  Continue wearing your wrist splint to protect the wrist until evaluated by orthopedics.

## 2015-04-11 NOTE — ED Notes (Signed)
Pt was seen here and DXed with broken bone in right hand, out of pain medication, Ortho appointment July 25

## 2015-04-11 NOTE — ED Notes (Signed)
Discharge instructions given - Pt verbalized understanding - Provided pt with names of 2 other orthopedic offices in [place] to FU with if she desires.

## 2015-04-11 NOTE — ED Notes (Signed)
Pt and partner requested some pain medication to go home with . PA informed and requested declined - pt and partner informed. Remained pleasant and ambulated off unit together  .

## 2015-04-12 NOTE — ED Provider Notes (Signed)
CSN: [id number]     Arrival date & time 04/11/15  2055 History   First MD Initiated Contact with Patient 04/11/15 2119     Chief Complaint  Patient presents with  . Hand Pain     (Consider location/radiation/quality/duration/timing/severity/associated sxs/prior Treatment) The history is provided by the patient and the spouse.   Melissa Huynh is a 54 y.o. female presenting with persistent right wrist pain since tripping over an electric cord in her home and falling 1 week ago. She was seen here and xrays were revealing for possible avulsion fracture at distal ulna, but favoring chronic degenerative changes.  She was placed in a velcro wrist splint which she continues to wear today. She has used her hydrocodone and also employed elevation and ice with persistent pain.  She is scheduled to f/u with orthopedics in 1 week for further managements of this injury.  She also injured her right knee during the fall, which she reports is improved. She denies numbness or weakness in her fingers.    Past Medical History  Diagnosis Date  . Hypertension   . High cholesterol   . Anxiety   . Diabetes mellitus without complication   . Back pain   . DJD (degenerative joint disease) 2012   Past Surgical History  Procedure Laterality Date  . Cholecystectomy    . Tonsillectomy    . Cesarean section    . Tubal ligation     Family History  Problem Relation Age of Onset  . Hypertension Mother   . Hypertension Sister    History  Substance Use Topics  . Smoking status: Current Every Day Smoker -- 0.50 packs/day for 30 years    Types: Cigarettes  . Smokeless tobacco: Not on file  . Alcohol Use: No   OB History    Gravida Para Term Preterm AB TAB SAB Ectopic Multiple Living   Review of Systems  Constitutional: Negative for fever.  Musculoskeletal: Positive for arthralgias. Negative for myalgias and joint swelling.  Neurological: Negative for weakness and numbness.       Allergies  Flexeril; Penicillins; Cheese; Toradol; and Tramadol  Home Medications   Prior to Admission medications   Medication Sig Start Date End Date Taking? Authorizing Provider  lisinopril-hydrochlorothiazide (PRINZIDE,ZESTORETIC) 20-12.5 MG per tablet Take 1 tablet by mouth 2 (two) times daily.   Yes Historical Provider, MD  LORazepam (ATIVAN) 0.5 MG tablet Take 0.5 mg by mouth 3 (three) times daily.   Yes Historical Provider, MD  metFORMIN (GLUCOPHAGE) 500 MG tablet Take 500 mg by mouth 2 (two) times daily with a meal.   Yes Historical Provider, MD  metoprolol tartrate (LOPRESSOR) 25 MG tablet Take 25 mg by mouth 2 (two) times daily.   Yes Historical Provider, MD  acetaminophen-codeine (TYLENOL #3) 300-30 MG per tablet Take 1-2 tablets by mouth every 6 (six) hours as needed. Patient not taking: Reported on 04/11/2015 03/08/15   Ivery Quale, PA-C  diclofenac (VOLTAREN) 75 MG EC tablet Take 1 tablet (75 mg total) by mouth 2 (two) times daily. Patient not taking: Reported on 04/11/2015 03/08/15   Ivery Quale, PA-C  guaiFENesin-codeine [facility]) 100-10 MG/5ML syrup Take 10 mLs by mouth 3 (three) times daily as needed. Patient not taking: Reported on 04/11/2015 02/16/15   Tammy Triplett, PA-C  HYDROcodone-acetaminophen (NORCO/VICODIN) 5-325 MG per tablet Take 1 tablet by mouth every 4 (four) hours as needed. 04/11/15   Raynelle Fanning  Tyson Parkison, PA-C  methocarbamol (ROBAXIN) 500 MG tablet Take 1 tablet (500 mg total) by mouth 2 (two) times daily. Patient not taking: Reported on 04/11/2015 12/08/14   Janne NapoleonHope M Neese, NP  predniSONE (DELTASONE) 10 MG tablet Take 6 tablets day one, 5 tablets day two, 4 tablets day three, 3 tablets day four, 2 tablets day five, then 1 tablet day six Patient not taking: Reported on 04/11/2015 02/16/15   Tammy Triplett, PA-C   BP 173/76 mmHg  Pulse 71  Temp(Src) 97.7 F (36.5 C) (Oral)  Resp 18  Ht 6' (1.829 m)  Wt 180 lb (81.647 kg)  BMI 24.41 kg/m2  SpO2 98%  LMP  01/18/2014 Physical Exam  Constitutional: She appears well-developed and well-nourished.  HENT:  Head: Atraumatic.  Neck: Normal range of motion.  Cardiovascular:  Pulses equal bilaterally  Musculoskeletal: She exhibits tenderness. She exhibits no edema.       Right wrist: She exhibits tenderness. She exhibits normal range of motion, no swelling, no effusion, no crepitus and no deformity.  Less than 2 sec cap refill in digits.  Equal grip strength.  ttp across the dorsal wrist joint, not localized to the ulna.  Neurological: She is alert. She has normal strength. She displays normal reflexes. No sensory deficit.  Skin: Skin is warm and dry.  Psychiatric: She has a normal mood and affect.    ED Course  Procedures (including critical care time) Labs Review Labs Reviewed - No data to display  Imaging Review No results found.   EKG Interpretation None      MDM   Final diagnoses:  Wrist pain, acute, right    Pt advised continued use of the velcro splint, f/u with orthopedics as planned.  She was originally referred to Dr. Romeo AppleHarrison, but she contacted Dr. Hilda LiasKeeling as family member has seen him in the past.  Suggested she may get in with Dr. Romeo AppleHarrison quicker as he was the physician referral given.  Pt understands.  Cautioned re sedation with medication.  Advised that she will need further pain management for this injury via her pcp or her orthopedist of choice.  The patient appears reasonably screened and/or stabilized for discharge and I doubt any other medical condition or other [facility] requiring further screening, evaluation, or treatment in the ED at this time prior to discharge.     Burgess AmorJulie Aanika Defoor, PA-C 04/12/15 1433  Gilda Creasehristopher J Pollina, MD 04/14/15 [id number]

## 2015-04-24 ENCOUNTER — Emergency Department (HOSPITAL_COMMUNITY)
Admission: EM | Admit: 2015-04-24 | Discharge: 2015-04-25 | Disposition: A | Discharge status: Home / Self Care | Payer: Self-pay | Attending: Emergency Medicine | Admitting: Emergency Medicine

## 2015-04-24 ENCOUNTER — Encounter (HOSPITAL_COMMUNITY): Payer: Self-pay | Admitting: *Deleted

## 2015-04-24 DIAGNOSIS — Z720 Tobacco use: Secondary | ICD-10-CM | POA: Insufficient documentation

## 2015-04-24 DIAGNOSIS — I10 Essential (primary) hypertension: Secondary | ICD-10-CM | POA: Insufficient documentation

## 2015-04-24 DIAGNOSIS — Y9389 Activity, other specified: Secondary | ICD-10-CM | POA: Insufficient documentation

## 2015-04-24 DIAGNOSIS — Z8739 Personal history of other diseases of the musculoskeletal system and connective tissue: Secondary | ICD-10-CM | POA: Insufficient documentation

## 2015-04-24 DIAGNOSIS — Z880 Allergy status to penicillin: Secondary | ICD-10-CM | POA: Insufficient documentation

## 2015-04-24 DIAGNOSIS — S3992XA Unspecified injury of lower back, initial encounter: Secondary | ICD-10-CM | POA: Insufficient documentation

## 2015-04-24 DIAGNOSIS — Z79899 Other long term (current) drug therapy: Secondary | ICD-10-CM | POA: Insufficient documentation

## 2015-04-24 DIAGNOSIS — Y9289 Other specified places as the place of occurrence of the external cause: Secondary | ICD-10-CM | POA: Insufficient documentation

## 2015-04-24 DIAGNOSIS — E119 Type 2 diabetes mellitus without complications: Secondary | ICD-10-CM | POA: Insufficient documentation

## 2015-04-24 DIAGNOSIS — W541XXA Struck by dog, initial encounter: Secondary | ICD-10-CM | POA: Insufficient documentation

## 2015-04-24 DIAGNOSIS — G43009 Migraine without aura, not intractable, without status migrainosus: Secondary | ICD-10-CM | POA: Insufficient documentation

## 2015-04-24 DIAGNOSIS — F419 Anxiety disorder, unspecified: Secondary | ICD-10-CM | POA: Insufficient documentation

## 2015-04-24 DIAGNOSIS — Y998 Other external cause status: Secondary | ICD-10-CM | POA: Insufficient documentation

## 2015-04-24 HISTORY — DX: Migraine, unspecified, not intractable, without status migrainosus: G43.909

## 2015-04-24 LAB — CBG MONITORING, ED: Glucose-Capillary: 129 mg/dL — ABNORMAL HIGH (ref 65–99)

## 2015-04-24 MED ORDER — METOCLOPRAMIDE HCL 5 MG/ML IJ SOLN
10.0000 mg | Freq: Once | INTRAMUSCULAR | Status: AC
  Administered 2015-04-24: 10 mg via INTRAVENOUS
  Filled 2015-04-24: qty 2

## 2015-04-24 MED ORDER — DIPHENHYDRAMINE HCL 50 MG/ML IJ SOLN
25.0000 mg | Freq: Once | INTRAMUSCULAR | Status: AC
  Administered 2015-04-24: 25 mg via INTRAVENOUS
  Filled 2015-04-24: qty 1

## 2015-04-24 MED ORDER — SODIUM CHLORIDE 0.9 % IV BOLUS (SEPSIS)
1000.0000 mL | Freq: Once | INTRAVENOUS | Status: AC
  Administered 2015-04-24: 1000 mL via INTRAVENOUS

## 2015-04-24 MED ORDER — SODIUM CHLORIDE 0.9 % IV BOLUS (SEPSIS)
1000.0000 mL | Freq: Once | INTRAVENOUS | Status: AC
  Administered 2015-04-25: 1000 mL via INTRAVENOUS

## 2015-04-24 NOTE — ED Notes (Signed)
Pt c/o headache that started yesterday, pain is associated with nausea, has hx of migraines, pt admits to light and sound sensitivity,

## 2015-04-24 NOTE — ED Provider Notes (Signed)
CSN: [id number]     Arrival date & time 04/24/15  2255 History  This chart was scribed for Devoria Albe, MD by Evon Slack, ED Scribe. This patient was seen in room APA08/APA08 and the patient's care was started at 11:02 PM.      Chief Complaint  Patient presents with  . Migraine   The history is provided by the patient. No language interpreter was used.   HPI Comments: Melissa Huynh is a 54 y.o. female who presents to the Emergency Department complaining of stabbing frontal HA onset yesterday at 8 Am. Pt states that the pain is located in the frontal area. Pt states she has associated nausea and black dots in her vision. Pt states she has photophobia and sensitively to sound. Pt states she has tried motrin and tylenol with no relief. No vomiting, numbness or tingling. Pt does report Hx of Migraine HA's but hasn't one in the past 2 years. This headache is typical of her other migraines including the black dots. Pt is also complaining of chronic back pain that's worse after a dog jumped on her yesterday. Pt states that she has an appointment with a back surgeon next week.   PCP none Cardiology Dr Earna Coder in [place] (manages all her meds)  Past Medical History  Diagnosis Date  . Hypertension   . High cholesterol   . Anxiety   . Diabetes mellitus without complication   . Back pain   . DJD (degenerative joint disease) 2012  . Migraine    Past Surgical History  Procedure Laterality Date  . Cholecystectomy    . Tonsillectomy    . Cesarean section    . Tubal ligation     Family History  Problem Relation Age of Onset  . Hypertension Mother   . Hypertension Sister    History  Substance Use Topics  . Smoking status: Current Every Day Smoker -- 0.50 packs/day for 30 years    Types: Cigarettes  . Smokeless tobacco: Not on file  . Alcohol Use: No   Lives with spouse  OB History    Gravida Para Term Preterm AB TAB SAB Ectopic Multiple Living   6 3 3  3  3         Review of  Systems  Eyes: Positive for photophobia.  Gastrointestinal: Positive for nausea. Negative for vomiting.  Neurological: Positive for headaches. Negative for weakness and numbness.  All other systems reviewed and are negative.     Allergies  Flexeril; Penicillins; Cheese; Toradol; and Tramadol  Home Medications   Prior to Admission medications   Medication Sig Start Date End Date Taking? Authorizing Provider  acetaminophen-codeine (TYLENOL #3) 300-30 MG per tablet Take 1-2 tablets by mouth every 6 (six) hours as needed. Patient not taking: Reported on 04/11/2015 03/08/15   Ivery Quale, PA-C  diclofenac (VOLTAREN) 75 MG EC tablet Take 1 tablet (75 mg total) by mouth 2 (two) times daily. Patient not taking: Reported on 04/11/2015 03/08/15   Ivery Quale, PA-C  guaiFENesin-codeine [facility]) 100-10 MG/5ML syrup Take 10 mLs by mouth 3 (three) times daily as needed. Patient not taking: Reported on 04/11/2015 02/16/15   Tammy Triplett, PA-C  HYDROcodone-acetaminophen (NORCO/VICODIN) 5-325 MG per tablet Take 1 tablet by mouth every 4 (four) hours as needed. 04/11/15   Burgess Amor, PA-C  lisinopril-hydrochlorothiazide (PRINZIDE,ZESTORETIC) 20-12.5 MG per tablet Take 1 tablet by mouth 2 (two) times daily.    Historical Provider, MD  LORazepam (ATIVAN) 0.5 MG tablet Take 0.5 mg  by mouth 3 (three) times daily.    Historical Provider, MD  metFORMIN (GLUCOPHAGE) 500 MG tablet Take 500 mg by mouth 2 (two) times daily with a meal.    Historical Provider, MD  methocarbamol (ROBAXIN) 500 MG tablet Take 1 tablet (500 mg total) by mouth 2 (two) times daily. Patient not taking: Reported on 04/11/2015 12/08/14   Janne Napoleon, NP  metoprolol tartrate (LOPRESSOR) 25 MG tablet Take 25 mg by mouth 2 (two) times daily.    Historical Provider, MD  predniSONE (DELTASONE) 10 MG tablet Take 6 tablets day one, 5 tablets day two, 4 tablets day three, 3 tablets day four, 2 tablets day five, then 1 tablet day six Patient  not taking: Reported on 04/11/2015 02/16/15   Tammy Triplett, PA-C   BP 164/80 mmHg  Pulse 97  Temp(Src) 98.5 F (36.9 C) (Oral)  Resp 20  Ht 6' (1.829 m)  Wt 180 lb (81.647 kg)  BMI 24.41 kg/m2  SpO2 100%  LMP 01/18/2014  Vital signs normal     Physical Exam  Constitutional: She is oriented to person, place, and time. She appears well-developed and well-nourished.  Non-toxic appearance. She does not appear ill. She appears distressed.  HENT:  Head: Normocephalic and atraumatic.  Right Ear: External ear normal.  Left Ear: External ear normal.  Nose: Nose normal. No mucosal edema or rhinorrhea.  Mouth/Throat: Oropharynx is clear and moist and mucous membranes are normal. No dental abscesses or uvula swelling.  Edentulous.   Eyes: Conjunctivae and EOM are normal. Pupils are equal, round, and reactive to light.  Neck: Normal range of motion and full passive range of motion without pain. Neck supple.  Cardiovascular: Normal rate, regular rhythm and normal heart sounds.  Exam reveals no gallop and no friction rub.   No murmur heard. Pulmonary/Chest: Effort normal and breath sounds normal. No respiratory distress. She has no wheezes. She has no rhonchi. She has no rales. She exhibits no tenderness and no crepitus.  Abdominal: Soft. Normal appearance and bowel sounds are normal. She exhibits no distension. There is no tenderness. There is no rebound and no guarding.  Musculoskeletal: Normal range of motion. She exhibits no edema or tenderness.  Moves all extremities well.   Neurological: She is alert and oriented to person, place, and time. She has normal strength. No cranial nerve deficit.  Skin: Skin is warm, dry and intact. No rash noted. No erythema. No pallor.  Psychiatric: She has a normal mood and affect. Her speech is normal and behavior is normal. Her mood appears not anxious.  Nursing note and vitals reviewed.   ED Course  Procedures (including critical care  time) Medications  sodium chloride 0.9 % bolus 1,000 mL (0 mLs Intravenous Stopped 04/25/15 0000)  sodium chloride 0.9 % bolus 1,000 mL (0 mLs Intravenous Stopped 04/25/15 0100)  metoCLOPramide (REGLAN) injection 10 mg (10 mg Intravenous Given 04/24/15 2323)  diphenhydrAMINE (BENADRYL) injection 25 mg (25 mg Intravenous Given 04/24/15 2323)  LORazepam (ATIVAN) injection 1 mg (1 mg Intravenous Given 04/25/15 0100)  magnesium sulfate IVPB 2 g 50 mL (0 g Intravenous Stopped 04/25/15 0042)  dexamethasone (DECADRON) injection 10 mg (10 mg Intravenous Given 04/25/15 0145)  valproate (DEPACON) 1,000 mg in dextrose 5 % 50 mL IVPB (1,000 mg Intravenous New Bag/Given 04/25/15 0318)   DIAGNOSTIC STUDIES: Oxygen Saturation is 100% on RA, normal by my interpretation.    COORDINATION OF CARE: 11:13 PM-Discussed treatment plan with pt at bedside and pt agreed  to plan.   Review of the [place] Database shows she has gotten # 90 lorazepam in Feb and March from Dr Earna Coder, # 20 Tylenol #3 in June from our ED, and # 10 and # 20 hydrocodone 5/325 in June and July from our ED.   reports not any better, more meds added.  01:30 patient sleeping, state headache better but not well enough to go home, decadron added.   Recheck 3 am, headache only mildly better, depacon added  04:40 headache is improving after the depacon, now feels ready to be discharged.   Labs Review Results for orders placed or performed during the hospital encounter of 04/24/15  CBG monitoring, ED  Result Value Ref Range   Glucose-Capillary 129 (H) 65 - 99 mg/dL       Imaging Review No results found.   EKG Interpretation None      MDM   Final diagnoses:  Migraine without aura and without status migrainosus, not intractable   Plan discharge  Devoria Albe, MD, FACEP    I personally performed the services described in this documentation, which was scribed in my presence. The recorded information has been reviewed and considered.  Devoria Albe, MD, Concha Pyo, MD 04/25/15 [phone]

## 2015-04-25 MED ORDER — VALPROATE SODIUM 500 MG/5ML IV SOLN
1000.0000 mg | Freq: Once | INTRAVENOUS | Status: AC
  Administered 2015-04-25: 1000 mg via INTRAVENOUS
  Filled 2015-04-25: qty 10

## 2015-04-25 MED ORDER — MAGNESIUM SULFATE 2 GM/50ML IV SOLN
2.0000 g | Freq: Once | INTRAVENOUS | Status: AC
  Administered 2015-04-25: 2 g via INTRAVENOUS
  Filled 2015-04-25: qty 50

## 2015-04-25 MED ORDER — VALPROATE SODIUM 500 MG/5ML IV SOLN
INTRAVENOUS | Status: AC
  Filled 2015-04-25: qty 10

## 2015-04-25 MED ORDER — DEXAMETHASONE SODIUM PHOSPHATE 4 MG/ML IJ SOLN
10.0000 mg | Freq: Once | INTRAMUSCULAR | Status: AC
  Administered 2015-04-25: 10 mg via INTRAVENOUS
  Filled 2015-04-25: qty 3

## 2015-04-25 MED ORDER — LORAZEPAM 2 MG/ML IJ SOLN
1.0000 mg | Freq: Once | INTRAMUSCULAR | Status: AC
  Administered 2015-04-25: 1 mg via INTRAVENOUS
  Filled 2015-04-25: qty 1

## 2015-04-25 MED ORDER — DIPHENHYDRAMINE HCL 50 MG/ML IJ SOLN: 25.0000 mg | Freq: Once | INTRAMUSCULAR | Status: DC

## 2015-04-25 MED ORDER — PROCHLORPERAZINE EDISYLATE 5 MG/ML IJ SOLN: 10.0000 mg | Freq: Once | INTRAMUSCULAR | Status: DC

## 2015-04-25 NOTE — Discharge Instructions (Signed)
Go home and rest. Drink plenty of fluids. Recheck as needed.    Recurrent Migraine Headache A migraine headache is an intense, throbbing pain on one or both sides of your head. Recurrent migraines keep coming back. A migraine can last for 30 minutes to several hours. CAUSES  The exact cause of a migraine headache is not always known. However, a migraine may be caused when nerves in the brain become irritated and release chemicals that cause inflammation. This causes pain. Certain things may also trigger migraines, such as:   Alcohol.  Smoking.  Stress.  Menstruation.  Aged cheeses.  Foods or drinks that contain nitrates, glutamate, aspartame, or tyramine.  Lack of sleep.  Chocolate.  Caffeine.  Hunger.  Physical exertion.  Fatigue.  Medicines used to treat chest pain (nitroglycerine), birth control pills, estrogen, and some blood pressure medicines. SYMPTOMS   Pain on one or both sides of your head.  Pulsating or throbbing pain.  Severe pain that prevents daily activities.  Pain that is aggravated by any physical activity.  Nausea, vomiting, or both.  Dizziness.  Pain with exposure to bright lights, loud noises, or activity.  General sensitivity to bright lights, loud noises, or smells. Before you get a migraine, you may get warning signs that a migraine is coming (aura). An aura may include:  Seeing flashing lights.  Seeing bright spots, halos, or zigzag lines.  Having tunnel vision or blurred vision.  Having feelings of numbness or tingling.  Having trouble talking.  Having muscle weakness. DIAGNOSIS  A recurrent migraine headache is often diagnosed based on:  Symptoms.  Physical examination.  A CT scan or MRI of your head. These imaging tests cannot diagnose migraines but can help rule out other causes of headaches.  TREATMENT  Medicines may be given for pain and nausea. Medicines can also be given to help prevent recurrent migraines. HOME  CARE INSTRUCTIONS  Only take over-the-counter or prescription medicines for pain or discomfort as directed by your health care provider. The use of long-term narcotics is not recommended.  Lie down in a dark, quiet room when you have a migraine.  Keep a journal to find out what may trigger your migraine headaches. For example, write down:  What you eat and drink.  How much sleep you get.  Any change to your diet or medicines.  Limit alcohol consumption.  Quit smoking if you smoke.  Get 7-9 hours of sleep, or as recommended by your health care provider.  Limit stress.  Keep lights dim if bright lights bother you and make your migraines worse. SEEK MEDICAL CARE IF:   You do not get relief from the medicines given to you.  You have a recurrence of pain.  You have a fever. SEEK IMMEDIATE MEDICAL CARE IF:  Your migraine becomes severe.  You have a stiff neck.  You have loss of vision.  You have muscular weakness or loss of muscle control.  You start losing your balance or have trouble walking.  You feel faint or pass out.  You have severe symptoms that are different from your first symptoms. MAKE SURE YOU:   Understand these instructions.  Will watch your condition.  Will get help right away if you are not doing well or get worse. Document Released: 06/05/2001 Document Revised: 01/25/2014 Document Reviewed: 05/18/2013 [facility] Patient Information 2015 [place], [place]. This information is not intended to replace advice given to you by your health care provider. Make sure you discuss any questions you have  with your health care provider. ° °

## 2015-07-21 ENCOUNTER — Emergency Department (HOSPITAL_COMMUNITY)
Admission: EM | Admit: 2015-07-21 | Discharge: 2015-07-21 | Disposition: A | Discharge status: Home / Self Care | Payer: Self-pay | Attending: Emergency Medicine | Admitting: Emergency Medicine

## 2015-07-21 ENCOUNTER — Encounter (HOSPITAL_COMMUNITY): Payer: Self-pay | Admitting: *Deleted

## 2015-07-21 DIAGNOSIS — Y9289 Other specified places as the place of occurrence of the external cause: Secondary | ICD-10-CM | POA: Insufficient documentation

## 2015-07-21 DIAGNOSIS — X58XXXA Exposure to other specified factors, initial encounter: Secondary | ICD-10-CM | POA: Insufficient documentation

## 2015-07-21 DIAGNOSIS — M5136 Other intervertebral disc degeneration, lumbar region: Secondary | ICD-10-CM | POA: Insufficient documentation

## 2015-07-21 DIAGNOSIS — Z79899 Other long term (current) drug therapy: Secondary | ICD-10-CM | POA: Insufficient documentation

## 2015-07-21 DIAGNOSIS — M5430 Sciatica, unspecified side: Secondary | ICD-10-CM | POA: Insufficient documentation

## 2015-07-21 DIAGNOSIS — G43909 Migraine, unspecified, not intractable, without status migrainosus: Secondary | ICD-10-CM | POA: Insufficient documentation

## 2015-07-21 DIAGNOSIS — E119 Type 2 diabetes mellitus without complications: Secondary | ICD-10-CM | POA: Insufficient documentation

## 2015-07-21 DIAGNOSIS — Y998 Other external cause status: Secondary | ICD-10-CM | POA: Insufficient documentation

## 2015-07-21 DIAGNOSIS — F419 Anxiety disorder, unspecified: Secondary | ICD-10-CM | POA: Insufficient documentation

## 2015-07-21 DIAGNOSIS — Z880 Allergy status to penicillin: Secondary | ICD-10-CM | POA: Insufficient documentation

## 2015-07-21 DIAGNOSIS — Y9389 Activity, other specified: Secondary | ICD-10-CM | POA: Insufficient documentation

## 2015-07-21 DIAGNOSIS — Z720 Tobacco use: Secondary | ICD-10-CM | POA: Insufficient documentation

## 2015-07-21 DIAGNOSIS — I10 Essential (primary) hypertension: Secondary | ICD-10-CM | POA: Insufficient documentation

## 2015-07-21 DIAGNOSIS — M47813 Spondylosis without myelopathy or radiculopathy, cervicothoracic region: Secondary | ICD-10-CM | POA: Insufficient documentation

## 2015-07-21 MED ORDER — DEXAMETHASONE SODIUM PHOSPHATE 4 MG/ML IJ SOLN
8.0000 mg | Freq: Once | INTRAMUSCULAR | Status: AC
Start: 2015-07-21 — End: 2015-07-21
  Administered 2015-07-21: 8 mg via INTRAMUSCULAR
  Filled 2015-07-21: qty 2

## 2015-07-21 MED ORDER — DIAZEPAM 5 MG PO TABS
10.0000 mg | ORAL_TABLET | Freq: Once | ORAL | Status: AC
  Administered 2015-07-21: 10 mg via ORAL
  Filled 2015-07-21: qty 2

## 2015-07-21 MED ORDER — ONDANSETRON HCL 4 MG PO TABS
4.0000 mg | ORAL_TABLET | Freq: Once | ORAL | Status: AC
  Administered 2015-07-21: 4 mg via ORAL
  Filled 2015-07-21: qty 1

## 2015-07-21 MED ORDER — MORPHINE SULFATE (PF) 4 MG/ML IV SOLN
8.0000 mg | Freq: Once | INTRAVENOUS | Status: AC
  Administered 2015-07-21: 8 mg via INTRAMUSCULAR
  Filled 2015-07-21: qty 2

## 2015-07-21 NOTE — ED Notes (Signed)
Pt c/o lower back pain after rolling over  Tuesday, pain radiates down right leg, denies any loose of bowel or bladder function,

## 2015-07-21 NOTE — ED Provider Notes (Signed)
CSN: [id number]     Arrival date & time 07/21/15  2040 History   None    Chief Complaint  Patient presents with  . Back Pain     (Consider location/radiation/quality/duration/timing/severity/associated sxs/prior Treatment) HPI Comments: Patient is a 54 year old female who presents to the emergency department with a complaint of lower back pain.  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 The patient is diabetic, and has a history of migraine headaches and anxiety. She states that 2 days ago she rolled over, heard and felt a pop in her back, and has had pain since that time. She presents now because she is having pain that is going into her right leg. There has not been any loss of bowel or bladder function reported. There's been no frequent of recurrent falls reported.  Patient is a 54 y.o. female presenting with back pain. The history is provided by the patient.  Back Pain Associated symptoms: headaches     Past Medical History  Diagnosis Date  . Hypertension   . High cholesterol   . Anxiety   . Diabetes mellitus without complication (HCC)   . Back pain   . DJD (degenerative joint disease) 2012  . Migraine    Past Surgical History  Procedure Laterality Date  . Cholecystectomy    . Tonsillectomy    . Cesarean section    . Tubal ligation     Family History  Problem Relation Age of Onset  . Hypertension Mother   . Hypertension Sister    Social History  Substance Use Topics  . Smoking status: Current Every Day Smoker -- 0.50 packs/day for 30 years    Types: Cigarettes  . Smokeless tobacco: None  . Alcohol Use: No   OB History    Gravida Para Term Preterm AB TAB SAB Ectopic Multiple Living   6 3 3  3  3         Review of Systems  Musculoskeletal: Positive for back pain.  Neurological: Positive for headaches.  Psychiatric/Behavioral: The patient is nervous/anxious.   All other systems  reviewed and are negative.     Allergies  Flexeril; Penicillins; Cheese; Toradol; and Tramadol  Home Medications   Prior to Admission medications   Medication Sig Start Date End Date Taking? Authorizing Provider  acetaminophen-codeine (TYLENOL #3) 300-30 MG per tablet Take 1-2 tablets by mouth every 6 (six) hours as needed. Patient not taking: Reported on 04/11/2015 03/08/15   Ivery QualeHobson Anajulia Leyendecker, PA-C  diclofenac (VOLTAREN) 75 MG EC tablet Take 1 tablet (75 mg total) by mouth 2 (two) times daily. Patient not taking: Reported on 04/11/2015 03/08/15   Ivery QualeHobson Jamin Humphries, PA-C  guaiFENesin-codeine [facility]ROBITUSSIN [facility]) 100-10 MG/5ML syrup Take 10 mLs by mouth 3 (three) times daily as needed. Patient not taking: Reported on 04/11/2015 02/16/15   Tammy Triplett, PA-C  HYDROcodone-acetaminophen (NORCO/VICODIN) 5-325 MG per tablet Take 1 tablet by mouth every 4 (four) hours as needed. 04/11/15   Burgess AmorJulie Idol, PA-C  lisinopril-hydrochlorothiazide (PRINZIDE,ZESTORETIC) 20-12.5 MG per tablet Take 1 tablet by mouth 2 (two) times daily.    Historical Provider, MD  LORazepam (ATIVAN) 0.5 MG tablet Take 0.5 mg by mouth 3 (three) times daily.    Historical Provider, MD  metFORMIN (GLUCOPHAGE) 500 MG tablet Take 500 mg by mouth 2 (two) times daily with a meal.    Historical Provider, MD  methocarbamol (ROBAXIN) 500 MG tablet Take 1 tablet (500 mg total) by  mouth 2 (two) times daily. Patient not taking: Reported on 04/11/2015 12/08/14   Janne Napoleon, NP  metoprolol tartrate (LOPRESSOR) 25 MG tablet Take 25 mg by mouth 2 (two) times daily.    Historical Provider, MD  predniSONE (DELTASONE) 10 MG tablet Take 6 tablets day one, 5 tablets day two, 4 tablets day three, 3 tablets day four, 2 tablets day five, then 1 tablet day six Patient not taking: Reported on 04/11/2015 02/16/15   Tammy Triplett, PA-C   BP 104/92 mmHg  Pulse 98  Temp(Src) 97.6 F (36.4 C) (Oral)  Resp 20  Ht 6' (1.829 m)  Wt 183 lb 4 oz (83.122 kg)  BMI 24.85  kg/m2  SpO2 100%  LMP 01/18/2014 Physical Exam  Constitutional: She is oriented to person, place, and time. She appears well-developed and well-nourished.  Non-toxic appearance.  HENT:  Head: Normocephalic.  Right Ear: Tympanic membrane and external ear normal.  Left Ear: Tympanic membrane and external ear normal.  Eyes: EOM and lids are normal. Pupils are equal, round, and reactive to light.  Neck: Normal range of motion. Neck supple. Carotid bruit is not present.  Cardiovascular: Normal rate, regular rhythm, normal heart sounds, intact distal pulses and normal pulses.   Pulmonary/Chest: Breath sounds normal. No respiratory distress.  Abdominal: Soft. Bowel sounds are normal. There is no tenderness. There is no guarding.  Musculoskeletal: Normal range of motion.       Lumbar back: She exhibits pain. She exhibits no deformity.  Lymphadenopathy:       Head (right side): No submandibular adenopathy present.       Head (left side): No submandibular adenopathy present.    She has no cervical adenopathy.  Neurological: She is alert and oriented to person, place, and time. She has normal strength. No cranial nerve deficit or sensory deficit. She exhibits normal muscle tone. Coordination normal.  Patient is ambulatory with minimal problem.  no foot drop.  Skin: Skin is warm and dry.  Psychiatric: She has a normal mood and affect. Her speech is normal.  Nursing note and vitals reviewed.   ED Course  Procedures (including critical care time) Labs Review Labs Reviewed - No data to display  Imaging Review No results found. I have personally reviewed and evaluated these images and lab results as part of my medical decision-making.   EKG Interpretation None      MDM  Examination favors sciatica, in the face of degenerative disc disease. I have reviewed the previous imaging and emergency department visits. Patient has a history of degenerative joint disease, as well as degenerative disc  disease. No gross neurologic deficits appreciated on today's exam. Patient treated in the emergency department with muscle relaxer narcotic injection and steroid injection. The patient is to follow with the primary physician for additional evaluation and management. No gross neurologic deficit appreciated at this time, feel that it is safe for the patient to be discharged home.    Final diagnoses:  Sciatica, unspecified laterality  DDD (degenerative disc disease), lumbar    **I have reviewed nursing notes, vital signs, and all appropriate lab and imaging results for this patient.Ivery Quale, PA-C 07/23/15 1746  Linwood Dibbles, MD 07/26/15 [phone]

## 2015-07-21 NOTE — Discharge Instructions (Signed)
You were treated tonight with an intramuscular narcotic medication, and an oral muscle relaxing medication. Please use caution getting around tonight and tomorrow. Please see your primary physician, or your specialist for assistance with your breakthrough back pains and discomfort. Sciatica Sciatica is pain, weakness, numbness, or tingling along your sciatic nerve. The nerve starts in the lower back and runs down the back of each leg. Nerve damage or certain conditions pinch or put pressure on the sciatic nerve. This causes the pain, weakness, and other discomforts of sciatica. HOME CARE   Only take medicine as told by your doctor.  Apply ice to the affected area for 20 minutes. Do this 3-4 times a day for the first 48-72 hours. Then try heat in the same way.  Exercise, stretch, or do your usual activities if these do not make your pain worse.  Go to physical therapy as told by your doctor.  Keep all doctor visits as told.  Do not wear high heels or shoes that are not supportive.  Get a firm mattress if your mattress is too soft to lessen pain and discomfort. GET HELP RIGHT AWAY IF:   You cannot control when you poop (bowel movement) or pee (urinate).  You have more weakness in your lower back, lower belly (pelvis), butt (buttocks), or legs.  You have redness or puffiness (swelling) of your back.  You have a burning feeling when you pee.  You have pain that gets worse when you lie down.  You have pain that wakes you from your sleep.  Your pain is worse than past pain.  Your pain lasts longer than 4 weeks.  You are suddenly losing weight without reason. MAKE SURE YOU:   Understand these instructions.  Will watch this condition.  Will get help right away if you are not doing well or get worse.   This information is not intended to replace advice given to you by your health care provider. Make sure you discuss any questions you have with your health care provider.     Document Released: 06/19/2008 Document Revised: 06/01/2015 Document Reviewed: 01/20/2012 Elsevier Interactive Patient Education 2016 Elsevier Inc.  Degenerative Disk Disease Degenerative disk disease is a condition caused by the changes that occur in spinal disks as you grow older. Spinal disks are soft and compressible disks located between the bones of your spine (vertebrae). These disks act like shock absorbers. Degenerative disk disease can affect the whole spine. However, the neck and lower back are most commonly affected. Many changes can occur in the spinal disks with aging, such as:  The spinal disks may dry and shrink.  Small tears may occur in the tough, outer covering of the disk (annulus).  The disk space may become smaller due to loss of water.  Abnormal growths in the bone (spurs) may occur. This can put pressure on the nerve roots exiting the spinal canal, causing pain.  The spinal canal may become narrowed. RISK FACTORS   Being overweight.  Having a family history of degenerative disk disease.  Smoking.  There is increased risk if you are doing heavy lifting or have a sudden injury. SIGNS AND SYMPTOMS  Symptoms vary from person to person and may include:  Pain that varies in intensity. Some people have no pain, while others have severe pain. The location of the pain depends on the part of your backbone that is affected.  You will have neck or arm pain if a disk in the neck area is affected.  You will have pain in your back, buttocks, or legs if a disk in the lower back is affected.  Pain that becomes worse while bending, reaching up, or with twisting movements.  Pain that may start gradually and then get worse as time passes. It may also start after a major or minor injury.  Numbness or tingling in the arms or legs. DIAGNOSIS  Your health care provider will ask you about your symptoms and about activities or habits that may cause the pain. He or she may also  ask about any injuries, diseases, or treatments you have had. Your health care provider will examine you to check for the range of movement that is possible in the affected area, to check for strength in your extremities, and to check for sensation in the areas of the arms and legs supplied by different nerve roots. You may also have:   An X-ray of the spine.  Other imaging tests, such as MRI. TREATMENT  Your health care provider will advise you on the best plan for treatment. Treatment may include:  Medicines.  Rehabilitation exercises. HOME CARE INSTRUCTIONS   Follow proper lifting and walking techniques as advised by your health care provider.  Maintain good posture.  Exercise regularly as advised by your health care provider.  Perform relaxation exercises.  Change your sitting, standing, and sleeping habits as advised by your health care provider.  Change positions frequently.  Lose weight or maintain a healthy weight as advised by your health care provider.  Do not use any tobacco products, including cigarettes, chewing tobacco, or electronic cigarettes. If you need help quitting, ask your health care provider.  Wear supportive footwear.  Take medicines only as directed by your health care provider. SEEK MEDICAL CARE IF:   Your pain does not go away within 1-4 weeks.  You have significant appetite or weight loss. SEEK IMMEDIATE MEDICAL CARE IF:   Your pain is severe.  You notice weakness in your arms, hands, or legs.  You begin to lose control of your bladder or bowel movements.  You have fevers or night sweats. MAKE SURE YOU:   Understand these instructions.  Will watch your condition.  Will get help right away if you are not doing well or get worse.   This information is not intended to replace advice given to you by your health care provider. Make sure you discuss any questions you have with your health care provider.   Document Released: 07/08/2007  Document Revised: 10/01/2014 Document Reviewed: 01/12/2014 Elsevier Interactive Patient Education [employer] Else[employer] Inc.

## 2015-09-14 ENCOUNTER — Emergency Department (HOSPITAL_COMMUNITY)
Admission: EM | Admit: 2015-09-14 | Discharge: 2015-09-14 | Disposition: A | Discharge status: Home / Self Care | Payer: Self-pay | Attending: Emergency Medicine | Admitting: Emergency Medicine

## 2015-09-14 ENCOUNTER — Encounter (HOSPITAL_COMMUNITY): Payer: Self-pay

## 2015-09-14 DIAGNOSIS — F17210 Nicotine dependence, cigarettes, uncomplicated: Secondary | ICD-10-CM | POA: Insufficient documentation

## 2015-09-14 DIAGNOSIS — X58XXXA Exposure to other specified factors, initial encounter: Secondary | ICD-10-CM | POA: Insufficient documentation

## 2015-09-14 DIAGNOSIS — M62830 Muscle spasm of back: Secondary | ICD-10-CM | POA: Insufficient documentation

## 2015-09-14 DIAGNOSIS — M546 Pain in thoracic spine: Secondary | ICD-10-CM

## 2015-09-14 DIAGNOSIS — I10 Essential (primary) hypertension: Secondary | ICD-10-CM | POA: Insufficient documentation

## 2015-09-14 DIAGNOSIS — Y9289 Other specified places as the place of occurrence of the external cause: Secondary | ICD-10-CM | POA: Insufficient documentation

## 2015-09-14 DIAGNOSIS — Z79899 Other long term (current) drug therapy: Secondary | ICD-10-CM | POA: Insufficient documentation

## 2015-09-14 DIAGNOSIS — E119 Type 2 diabetes mellitus without complications: Secondary | ICD-10-CM | POA: Insufficient documentation

## 2015-09-14 DIAGNOSIS — Y9389 Activity, other specified: Secondary | ICD-10-CM | POA: Insufficient documentation

## 2015-09-14 DIAGNOSIS — S29012A Strain of muscle and tendon of back wall of thorax, initial encounter: Secondary | ICD-10-CM | POA: Insufficient documentation

## 2015-09-14 DIAGNOSIS — Z880 Allergy status to penicillin: Secondary | ICD-10-CM | POA: Insufficient documentation

## 2015-09-14 DIAGNOSIS — Y998 Other external cause status: Secondary | ICD-10-CM | POA: Insufficient documentation

## 2015-09-14 DIAGNOSIS — Z7984 Long term (current) use of oral hypoglycemic drugs: Secondary | ICD-10-CM | POA: Insufficient documentation

## 2015-09-14 DIAGNOSIS — F419 Anxiety disorder, unspecified: Secondary | ICD-10-CM | POA: Insufficient documentation

## 2015-09-14 MED ORDER — OXYCODONE-ACETAMINOPHEN 5-325 MG PO TABS
1.0000 | ORAL_TABLET | Freq: Once | ORAL | Status: AC
  Administered 2015-09-14: 1 via ORAL
  Filled 2015-09-14: qty 1

## 2015-09-14 MED ORDER — METHOCARBAMOL 500 MG PO TABS
500.0000 mg | ORAL_TABLET | Freq: Once | ORAL | Status: AC
  Administered 2015-09-14: 500 mg via ORAL
  Filled 2015-09-14: qty 1

## 2015-09-14 MED ORDER — HYDROCODONE-ACETAMINOPHEN 5-325 MG PO TABS: 1.0000 | ORAL_TABLET | Freq: Four times a day (QID) | ORAL | Status: DC | PRN

## 2015-09-14 MED ORDER — METHOCARBAMOL 500 MG PO TABS: 500.0000 mg | ORAL_TABLET | Freq: Two times a day (BID) | ORAL | Status: DC

## 2015-09-14 NOTE — ED Provider Notes (Signed)
CSN: [id number]     Arrival date & time 09/14/15  1711 History   First MD Initiated Contact with Patient 09/14/15 1720     Chief Complaint  Patient presents with  . Back Pain     (Consider location/radiation/quality/duration/timing/severity/associated sxs/prior Treatment) Patient is a 54 y.o. female presenting with back pain. The history is provided by the patient.  Back Pain Location:  Thoracic spine Quality:  Shooting Radiates to:  Does not radiate Onset quality:  Gradual Duration:  3 days Timing:  Constant Progression:  Worsening Chronicity:  New Context: lifting heavy objects and physical stress   Relieved by:  Nothing Worsened by:  Movement, standing, ambulation and bending Ineffective treatments:  Ibuprofen Associated symptoms: no bladder incontinence, no bowel incontinence and no dysuria    Melissa Huynh is a 54 y.o. female with multiple past visits for pain presents to the ED with lower back pain that started 3 days ago after helping move furniture and hang lights.   Past Medical History  Diagnosis Date  . Hypertension   . High cholesterol   . Anxiety   . Diabetes mellitus without complication (HCC)   . Back pain   . DJD (degenerative joint disease) 2012  . Migraine    Past Surgical History  Procedure Laterality Date  . Cholecystectomy    . Tonsillectomy    . Cesarean section    . Tubal ligation     Family History  Problem Relation Age of Onset  . Hypertension Mother   . Hypertension Sister    Social History  Substance Use Topics  . Smoking status: Current Every Day Smoker -- 0.50 packs/day for 30 years    Types: Cigarettes  . Smokeless tobacco: None  . Alcohol Use: No   OB History    Gravida Para Term Preterm AB TAB SAB Ectopic Multiple Living   6 3 3  3  3         Review of Systems  Gastrointestinal: Negative for bowel incontinence.  Genitourinary: Negative for bladder incontinence and dysuria.  Musculoskeletal: Positive for back pain.  all  other systems negative    Allergies  Flexeril; Penicillins; Cheese; Toradol; and Tramadol  Home Medications   Prior to Admission medications   Medication Sig Start Date End Date Taking? Authorizing Provider  lisinopril-hydrochlorothiazide (PRINZIDE,ZESTORETIC) 20-12.5 MG per tablet Take 1 tablet by mouth 2 (two) times daily.   Yes Historical Provider, MD  LORazepam (ATIVAN) 0.5 MG tablet Take 0.5 mg by mouth 3 (three) times daily.   Yes Historical Provider, MD  metFORMIN (GLUCOPHAGE) 500 MG tablet Take 500 mg by mouth 2 (two) times daily with a meal.   Yes Historical Provider, MD  metoprolol tartrate (LOPRESSOR) 25 MG tablet Take 25 mg by mouth 2 (two) times daily.   Yes Historical Provider, MD  HYDROcodone-acetaminophen (NORCO) 5-325 MG tablet Take 1 tablet by mouth every 6 (six) hours as needed for moderate pain. 09/14/15   Hope Orlene OchM Neese, NP  methocarbamol (ROBAXIN) 500 MG tablet Take 1 tablet (500 mg total) by mouth 2 (two) times daily. 09/14/15   Hope Orlene OchM Neese, NP   BP 153/98 mmHg  Pulse 98  Temp(Src) 97.5 F (36.4 C) (Oral)  Resp 18  Ht 6' (1.829 m)  Wt 81.194 kg  BMI 24.27 kg/m2  SpO2 99%  LMP 01/18/2014 Physical Exam  Constitutional: She is oriented to person, place, and time. She appears well-developed and well-nourished. No distress.  HENT:  Head: Normocephalic and atraumatic.  Right  Ear: Tympanic membrane normal.  Left Ear: Tympanic membrane normal.  Nose: Nose normal.  Mouth/Throat: Uvula is midline, oropharynx is clear and moist and mucous membranes are normal.  Eyes: EOM are normal.  Neck: Normal range of motion. Neck supple.  Cardiovascular: Normal rate and regular rhythm.   Pulmonary/Chest: Effort normal. She has no wheezes. She has no rales.  Abdominal: Soft. Bowel sounds are normal. There is no tenderness.  Musculoskeletal: Normal range of motion.       Thoracic back: She exhibits tenderness and spasm. She exhibits normal pulse. Decreased range of motion: due  to pain.       Back:  Neurological: She is alert and oriented to person, place, and time. She has normal strength. No cranial nerve deficit or sensory deficit. Gait normal.  Reflex Scores:      Bicep reflexes are 2+ on the right side and 2+ on the left side.      Brachioradialis reflexes are 2+ on the right side and 2+ on the left side.      Patellar reflexes are 2+ on the right side and 2+ on the left side.      Achilles reflexes are 2+ on the right side and 2+ on the left side. Skin: Skin is warm and dry.  Psychiatric: She has a normal mood and affect. Her behavior is normal.  Nursing note and vitals reviewed.   ED Course  Procedures (including critical care time) Labs Review Labs Reviewed - No data to display   MDM  54 y.o. female with back pain s/p helping a friend move furniture 3 days ago. Stable for d/c without neuro deficits. Will treat for muscle strain and spasm. Discussed with the patient and all questioned fully answered. She will return if any problems arise.   Final diagnoses:  Bilateral thoracic back pain      Janne Napoleon, NP 09/14/15 2012  Mancel Bale, MD 09/14/15 [phone]

## 2015-09-14 NOTE — ED Notes (Signed)
Pt reports pain in mid to lower back x 3 days after helping move furniture and hang lights.

## 2016-01-15 ENCOUNTER — Encounter (HOSPITAL_COMMUNITY): Payer: Self-pay

## 2016-01-15 ENCOUNTER — Emergency Department (HOSPITAL_COMMUNITY)
Admission: EM | Admit: 2016-01-15 | Discharge: 2016-01-15 | Disposition: A | Discharge status: Home / Self Care | Payer: Self-pay | Attending: Emergency Medicine | Admitting: Emergency Medicine

## 2016-01-15 DIAGNOSIS — W010XXA Fall on same level from slipping, tripping and stumbling without subsequent striking against object, initial encounter: Secondary | ICD-10-CM | POA: Insufficient documentation

## 2016-01-15 DIAGNOSIS — Z7984 Long term (current) use of oral hypoglycemic drugs: Secondary | ICD-10-CM | POA: Insufficient documentation

## 2016-01-15 DIAGNOSIS — Y939 Activity, unspecified: Secondary | ICD-10-CM | POA: Insufficient documentation

## 2016-01-15 DIAGNOSIS — S39012A Strain of muscle, fascia and tendon of lower back, initial encounter: Secondary | ICD-10-CM

## 2016-01-15 DIAGNOSIS — F17210 Nicotine dependence, cigarettes, uncomplicated: Secondary | ICD-10-CM | POA: Insufficient documentation

## 2016-01-15 DIAGNOSIS — E119 Type 2 diabetes mellitus without complications: Secondary | ICD-10-CM | POA: Insufficient documentation

## 2016-01-15 DIAGNOSIS — M51369 Other intervertebral disc degeneration, lumbar region without mention of lumbar back pain or lower extremity pain: Secondary | ICD-10-CM

## 2016-01-15 DIAGNOSIS — I10 Essential (primary) hypertension: Secondary | ICD-10-CM | POA: Insufficient documentation

## 2016-01-15 DIAGNOSIS — Y929 Unspecified place or not applicable: Secondary | ICD-10-CM | POA: Insufficient documentation

## 2016-01-15 DIAGNOSIS — H6092 Unspecified otitis externa, left ear: Secondary | ICD-10-CM

## 2016-01-15 DIAGNOSIS — Y999 Unspecified external cause status: Secondary | ICD-10-CM | POA: Insufficient documentation

## 2016-01-15 DIAGNOSIS — J069 Acute upper respiratory infection, unspecified: Secondary | ICD-10-CM

## 2016-01-15 DIAGNOSIS — M5136 Other intervertebral disc degeneration, lumbar region: Secondary | ICD-10-CM | POA: Insufficient documentation

## 2016-01-15 MED ORDER — NEOMYCIN-POLYMYXIN-HC 3.5-10000-1 OT SUSP
3.0000 [drp] | Freq: Four times a day (QID) | OTIC | Status: DC
  Administered 2016-01-15: 3 [drp] via OTIC
  Filled 2016-01-15: qty 10

## 2016-01-15 MED ORDER — DICLOFENAC SODIUM 75 MG PO TBEC: 75.0000 mg | DELAYED_RELEASE_TABLET | Freq: Two times a day (BID) | ORAL | Status: DC

## 2016-01-15 MED ORDER — DEXAMETHASONE SODIUM PHOSPHATE 4 MG/ML IJ SOLN
8.0000 mg | Freq: Once | INTRAMUSCULAR | Status: AC
  Administered 2016-01-15: 8 mg via INTRAMUSCULAR
  Filled 2016-01-15: qty 2

## 2016-01-15 MED ORDER — DIPHENHYDRAMINE HCL 12.5 MG/5ML PO ELIX
12.5000 mg | ORAL_SOLUTION | Freq: Once | ORAL | Status: AC
  Administered 2016-01-15: 12.5 mg via ORAL
  Filled 2016-01-15: qty 5

## 2016-01-15 MED ORDER — DIAZEPAM 5 MG PO TABS: 5.0000 mg | ORAL_TABLET | Freq: Three times a day (TID) | ORAL | Status: DC

## 2016-01-15 MED ORDER — DIAZEPAM 5 MG PO TABS
10.0000 mg | ORAL_TABLET | Freq: Once | ORAL | Status: AC
  Administered 2016-01-15: 10 mg via ORAL
  Filled 2016-01-15: qty 2

## 2016-01-15 NOTE — Discharge Instructions (Signed)
Please use 3 drops of Cortisporin to your left ear 3 times daily. Use Benadryl as a decongestant of your choice for the congestion present. Use of Valium 3 times daily for spasm pain . Use diclofenac 2 times daily with food. Please see the physicians at the [facility] to establish a primary physician. Please keep your appointment with the pain specialist. Muscle Strain A muscle strain (pulled muscle) happens when a muscle is stretched beyond normal length. It happens when a sudden, violent force stretches your muscle too far. Usually, a few of the fibers in your muscle are torn. Muscle strain is common in athletes. Recovery usually takes 1-2 weeks. Complete healing takes 5-6 weeks.  HOME CARE   Follow the PRICE method of treatment to help your injury get better. Do this the first 2-3 days after the injury:  Protect. Protect the muscle to keep it from getting injured again.  Rest. Limit your activity and rest the injured body part.  Ice. Put ice in a plastic bag. Place a towel between your skin and the bag. Then, apply the ice and leave it on from 15-20 minutes each hour. After the third day, switch to moist heat packs.  Compression. Use a splint or elastic bandage on the injured area for comfort. Do not put it on too tightly.  Elevate. Keep the injured body part above the level of your heart.  Only take medicine as told by your doctor.  Warm up before doing exercise to prevent future muscle strains. GET HELP IF:   You have more pain or puffiness (swelling) in the injured area.  You feel numbness, tingling, or notice a loss of strength in the injured area. MAKE SURE YOU:   Understand these instructions.  Will watch your condition.  Will get help right away if you are not doing well or get worse.   This information is not intended to replace advice given to you by your health care provider. Make sure you discuss any questions you have with your health care provider.   Document Released: 06/19/2008 Document Revised: 07/01/2013 Document Reviewed: 04/09/2013 Elsevier Interactive Patient Education 2016 Elsevier Inc.  Degenerative Disk Disease Degenerative disk disease is a condition caused by the changes that occur in spinal disks as you grow older. Spinal disks are soft and compressible disks located between the bones of your spine (vertebrae). These disks act like shock absorbers. Degenerative disk disease can affect the whole spine. However, the neck and lower back are most commonly affected. Many changes can occur in the spinal disks with aging, such as:  The spinal disks may dry and shrink.  Small tears may occur in the tough, outer covering of the disk (annulus).  The disk space may become smaller due to loss of water.  Abnormal growths in the bone (spurs) may occur. This can put pressure on the nerve roots exiting the spinal canal, causing pain.  The spinal canal may become narrowed. RISK FACTORS   Being overweight.  Having a family history of degenerative disk disease.  Smoking.  There is increased risk if you are doing heavy lifting or have a sudden injury. SIGNS AND SYMPTOMS  Symptoms vary from person to person and may include:  Pain that varies in intensity. Some people have no pain, while others have severe pain. The location of the pain depends on the part of your backbone that is affected.  You will have neck or arm pain if a disk in the neck area  is affected.  You will have pain in your back, buttocks, or legs if a disk in the lower back is affected.  Pain that becomes worse while bending, reaching up, or with twisting movements.  Pain that may start gradually and then get worse as time passes. It may also start after a major or minor injury.  Numbness or tingling in the arms or legs. DIAGNOSIS  Your health care provider will ask you about your symptoms and about activities or habits that may cause the pain. He or she may also  ask about any injuries, diseases, or treatments you have had. Your health care provider will examine you to check for the range of movement that is possible in the affected area, to check for strength in your extremities, and to check for sensation in the areas of the arms and legs supplied by different nerve roots. You may also have:   An X-ray of the spine.  Other imaging tests, such as MRI. TREATMENT  Your health care provider will advise you on the best plan for treatment. Treatment may include:  Medicines.  Rehabilitation exercises. HOME CARE INSTRUCTIONS   Follow proper lifting and walking techniques as advised by your health care provider.  Maintain good posture.  Exercise regularly as advised by your health care provider.  Perform relaxation exercises.  Change your sitting, standing, and sleeping habits as advised by your health care provider.  Change positions frequently.  Lose weight or maintain a healthy weight as advised by your health care provider.  Do not use any tobacco products, including cigarettes, chewing tobacco, or electronic cigarettes. If you need help quitting, ask your health care provider.  Wear supportive footwear.  Take medicines only as directed by your health care provider. SEEK MEDICAL CARE IF:   Your pain does not go away within 1-4 weeks.  You have significant appetite or weight loss. SEEK IMMEDIATE MEDICAL CARE IF:   Your pain is severe.  You notice weakness in your arms, hands, or legs.  You begin to lose control of your bladder or bowel movements.  You have fevers or night sweats. MAKE SURE YOU:   Understand these instructions.  Will watch your condition.  Will get help right away if you are not doing well or get worse.   This information is not intended to replace advice given to you by your health care provider. Make sure you discuss any questions you have with your health care provider.   Document Released: 07/08/2007  Document Revised: 10/01/2014 Document Reviewed: 01/12/2014 Elsevier Interactive Patient Education [employer]

## 2016-01-15 NOTE — ED Notes (Signed)
Pt verbalized understanding of no driving and to use caution within 4 hours of taking pain meds due to meds cause drowsiness 

## 2016-01-15 NOTE — ED Provider Notes (Signed)
History  By signing my name below, I, Karle PlumberJennifer Tensley, attest that this documentation has been prepared under the direction and in the presence of Ivery QualeHobson Kynsley Whitehouse, PA-C. Electronically Signed: Karle PlumberJennifer Tensley, ED Scribe. 01/15/2016. 7:48 PM.  Chief Complaint  Patient presents with  . Otalgia  . Fall   The history is provided by the patient and medical records. No language interpreter was used.    HPI Comments:  Melissa Huynh is a 55 y.o. female with chronic back pain and DJD who presents to the Emergency Department complaining of left ear ache that began four days ago. Pt reports decreased hearing from the ear. She states she washed her hair two days ago and thinks she got water in it. She has not done anything to treat the pain. She denies modifying factors. She denies fever, chills, nausea or vomiting.  She reports some lower back pain secondary to a fall sustained two days ago. She reports bruising of the lower extremities. Movement increases the pain. She denies alleviating factors. She denies head trauma, LOC, numbness, tingling or weakness of the lower extremities. She has an appt with pain management on 01/25/16.  Past Medical History  Diagnosis Date  . Hypertension   . High cholesterol   . Anxiety   . Diabetes mellitus without complication (HCC)   . Back pain   . DJD (degenerative joint disease) 2012  . Migraine    Past Surgical History  Procedure Laterality Date  . Cholecystectomy    . Tonsillectomy    . Cesarean section    . Tubal ligation     Family History  Problem Relation Age of Onset  . Hypertension Mother   . Hypertension Sister    Social History  Substance Use Topics  . Smoking status: Current Every Day Smoker -- 0.50 packs/day for 30 years    Types: Cigarettes  . Smokeless tobacco: None  . Alcohol Use: No   OB History    Gravida Para Term Preterm AB TAB SAB Ectopic Multiple Living   6 3 3  3  3         Review of Systems  HENT: Positive for ear pain.    Musculoskeletal: Positive for back pain.  Skin: Positive for color change.  All other systems reviewed and are negative.   Allergies  Flexeril; Penicillins; Cheese; Toradol; and Tramadol  Home Medications   Prior to Admission medications   Medication Sig Start Date End Date Taking? Authorizing Provider  HYDROcodone-acetaminophen (NORCO) 5-325 MG tablet Take 1 tablet by mouth every 6 (six) hours as needed for moderate pain. 09/14/15   Hope Orlene OchM Neese, NP  lisinopril-hydrochlorothiazide (PRINZIDE,ZESTORETIC) 20-12.5 MG per tablet Take 1 tablet by mouth 2 (two) times daily.    Historical Provider, MD  LORazepam (ATIVAN) 0.5 MG tablet Take 0.5 mg by mouth 3 (three) times daily.    Historical Provider, MD  metFORMIN (GLUCOPHAGE) 500 MG tablet Take 500 mg by mouth 2 (two) times daily with a meal.    Historical Provider, MD  methocarbamol (ROBAXIN) 500 MG tablet Take 1 tablet (500 mg total) by mouth 2 (two) times daily. 09/14/15   Hope Orlene OchM Neese, NP  metoprolol tartrate (LOPRESSOR) 25 MG tablet Take 25 mg by mouth 2 (two) times daily.    Historical Provider, MD   Triage Vitals: BP 179/87 mmHg  Pulse 93  Temp(Src) 98.3 F (36.8 C) (Oral)  Ht 6' (1.829 m)  Wt 180 lb (81.647 kg)  BMI 24.41 kg/m2  SpO2 98%  LMP 01/18/2014 Physical Exam  Constitutional: She is oriented to person, place, and time. She appears well-developed and well-nourished.  HENT:  Head: Normocephalic and atraumatic.  Nasal congestion present. Mild increased redness of external auditory canal. Cerumen impaction of left ear. No pre or post auricular nodes present.  Eyes: EOM are normal.  Neck: Normal range of motion.  Cardiovascular: Normal rate, regular rhythm and normal heart sounds.  Exam reveals no gallop and no friction rub.   No murmur heard. Pulmonary/Chest: Effort normal and breath sounds normal. No respiratory distress. She has no wheezes. She has no rales.  Musculoskeletal: Normal range of motion.  Full ROM of  bilateral ankles and feet. Small soft bruise of inner aspect of right knee.  Lymphadenopathy:    She has no cervical adenopathy.  Neurological: She is alert and oriented to person, place, and time.  Pt is ambulatory. No foot drop.  Skin: Skin is warm and dry.  Psychiatric: She has a normal mood and affect. Her behavior is normal.  Nursing note and vitals reviewed.   ED Course  Procedures (including critical care time) DIAGNOSTIC STUDIES: Oxygen Saturation is 98% on RA, normal by my interpretation.   COORDINATION OF CARE: 7:48 PM- Will have nurse irrigate left ear for cerumen impaction. Pt verbalizes understanding and agrees to plan.  Medications - No data to display  Labs Review Labs Reviewed - No data to display  Imaging Review No results found. I have personally reviewed and evaluated these images and lab results as part of my medical decision-making.   EKG Interpretation None      MDM  The examination is consistent with otitis externa and upper respiratory infection. The patient has a history of degenerative disc disease and is probably having an exacerbation of this problem. The vital signs have been reviewed. At the time of discharge the patient is ambulatory. She states that she will be sone engaged in a pain clinic in [place]. I discussed with the patient we will not use narcotic pain medication for a chronic illness. Patient is placed on diclofenac  And valium.    Final diagnoses:  Otitis externa, left  URI (upper respiratory infection)  Lumbar strain, initial encounter  DDD (degenerative disc disease), lumbar    *I have reviewed nursing notes, vital signs, and all appropriate lab and imaging results for this patient.**  I personally performed the services described in this documentation, which was scribed in my presence. The recorded information has been reviewed and is accurate.    Ivery Quale, PA-C 01/17/16 0032  Samuel Jester,  DO 01/18/16 [phone]

## 2016-01-15 NOTE — ED Notes (Addendum)
Had a left ear ache on Wednesday, and I washed my hair on Thursday and I think I got water in it.  I also slipped and fell on Friday. Have bruises on my legs and my low back hurts.  History of low back pain. [place]

## 2016-04-03 ENCOUNTER — Emergency Department (HOSPITAL_COMMUNITY)
Admission: EM | Admit: 2016-04-03 | Discharge: 2016-04-03 | Disposition: A | Discharge status: Home / Self Care | Payer: Self-pay | Attending: Emergency Medicine | Admitting: Emergency Medicine

## 2016-04-03 ENCOUNTER — Encounter (HOSPITAL_COMMUNITY): Payer: Self-pay | Admitting: Emergency Medicine

## 2016-04-03 ENCOUNTER — Emergency Department (HOSPITAL_COMMUNITY): Payer: Self-pay

## 2016-04-03 DIAGNOSIS — Y999 Unspecified external cause status: Secondary | ICD-10-CM | POA: Insufficient documentation

## 2016-04-03 DIAGNOSIS — Y939 Activity, unspecified: Secondary | ICD-10-CM | POA: Insufficient documentation

## 2016-04-03 DIAGNOSIS — Z79899 Other long term (current) drug therapy: Secondary | ICD-10-CM | POA: Insufficient documentation

## 2016-04-03 DIAGNOSIS — Z7984 Long term (current) use of oral hypoglycemic drugs: Secondary | ICD-10-CM | POA: Insufficient documentation

## 2016-04-03 DIAGNOSIS — R51 Headache: Secondary | ICD-10-CM | POA: Insufficient documentation

## 2016-04-03 DIAGNOSIS — E119 Type 2 diabetes mellitus without complications: Secondary | ICD-10-CM | POA: Insufficient documentation

## 2016-04-03 DIAGNOSIS — S8001XA Contusion of right knee, initial encounter: Secondary | ICD-10-CM | POA: Insufficient documentation

## 2016-04-03 DIAGNOSIS — Y929 Unspecified place or not applicable: Secondary | ICD-10-CM | POA: Insufficient documentation

## 2016-04-03 DIAGNOSIS — F17210 Nicotine dependence, cigarettes, uncomplicated: Secondary | ICD-10-CM | POA: Insufficient documentation

## 2016-04-03 DIAGNOSIS — I10 Essential (primary) hypertension: Secondary | ICD-10-CM | POA: Insufficient documentation

## 2016-04-03 DIAGNOSIS — W1830XA Fall on same level, unspecified, initial encounter: Secondary | ICD-10-CM | POA: Insufficient documentation

## 2016-04-03 DIAGNOSIS — S39012A Strain of muscle, fascia and tendon of lower back, initial encounter: Secondary | ICD-10-CM | POA: Insufficient documentation

## 2016-04-03 MED ORDER — DIAZEPAM 5 MG PO TABS
ORAL_TABLET | ORAL | Status: AC
  Filled 2016-04-03: qty 1

## 2016-04-03 MED ORDER — DIAZEPAM 5 MG PO TABS: 5.0000 mg | ORAL_TABLET | Freq: Three times a day (TID) | ORAL | Status: DC

## 2016-04-03 MED ORDER — DIAZEPAM 5 MG PO TABS
10.0000 mg | ORAL_TABLET | Freq: Once | ORAL | Status: AC
  Administered 2016-04-03: 10 mg via ORAL
  Filled 2016-04-03: qty 2

## 2016-04-03 MED ORDER — DICLOFENAC SODIUM 75 MG PO TBEC: 75.0000 mg | DELAYED_RELEASE_TABLET | Freq: Two times a day (BID) | ORAL | Status: DC

## 2016-04-03 MED ORDER — INDOMETHACIN 25 MG PO CAPS
25.0000 mg | ORAL_CAPSULE | Freq: Once | ORAL | Status: AC
  Administered 2016-04-03: 25 mg via ORAL
  Filled 2016-04-03: qty 1

## 2016-04-03 MED ORDER — PREDNISONE 20 MG PO TABS
40.0000 mg | ORAL_TABLET | Freq: Once | ORAL | Status: DC
Start: 2016-04-03 — End: 2016-04-03
  Filled 2016-04-03: qty 2

## 2016-04-03 NOTE — ED Provider Notes (Signed)
CSN: [id number]     Arrival date & time 04/03/16  2035 History   First MD Initiated Contact with Patient 04/03/16 2045     Chief Complaint  Patient presents with  . Knee Pain     (Consider location/radiation/quality/duration/timing/severity/associated sxs/prior Treatment) Patient is a 55 y.o. female presenting with knee pain. The history is provided by the patient.  Knee Pain Location:  Knee Time since incident:  1 day Injury: yes   Mechanism of injury: fall   Fall:    Fall occurred:  Standing   Point of impact:  Knees Knee location:  R knee Pain details:    Quality:  Aching   Severity:  Moderate   Onset quality:  Gradual   Duration:  1 day   Timing:  Intermittent   Progression:  Worsening Chronicity:  New Dislocation: no   Prior injury to area: injury from previous falls. Relieved by:  Nothing Worsened by:  Bearing weight Associated symptoms: back pain and decreased ROM   Associated symptoms: no neck pain, no numbness and no tingling   Risk factors: no frequent fractures and no known bone disorder     Past Medical History  Diagnosis Date  . Hypertension   . High cholesterol   . Anxiety   . Diabetes mellitus without complication (HCC)   . Back pain   . DJD (degenerative joint disease) 2012  . Migraine    Past Surgical History  Procedure Laterality Date  . Cholecystectomy    . Tonsillectomy    . Cesarean section    . Tubal ligation     Family History  Problem Relation Age of Onset  . Hypertension Mother   . Hypertension Sister    Social History  Substance Use Topics  . Smoking status: Current Every Day Smoker -- 0.50 packs/day for 30 years    Types: Cigarettes  . Smokeless tobacco: None  . Alcohol Use: No   OB History    Gravida Para Term Preterm AB TAB SAB Ectopic Multiple Living   Review of Systems  Constitutional: Negative for activity change.       All ROS Neg except as noted in HPI  HENT: Negative for nosebleeds.     Eyes: Negative for photophobia and discharge.  Respiratory: Negative for cough, shortness of breath and wheezing.   Cardiovascular: Negative for chest pain and palpitations.  Gastrointestinal: Negative for abdominal pain and blood in stool.  Genitourinary: Negative for dysuria, frequency and hematuria.  Musculoskeletal: Positive for back pain and arthralgias. Negative for neck pain.  Skin: Negative.   Neurological: Positive for headaches. Negative for dizziness, seizures and speech difficulty.  Psychiatric/Behavioral: Negative for hallucinations and confusion. The patient is nervous/anxious.       Allergies  Flexeril; Penicillins; Cheese; Toradol; and Tramadol  Home Medications   Prior to Admission medications   Medication Sig Start Date End Date Taking? Authorizing Provider  diazepam (VALIUM) 5 MG tablet Take 1 tablet (5 mg total) by mouth 3 (three) times daily. 01/15/16   Ivery Quale, PA-C  diclofenac (VOLTAREN) 75 MG EC tablet Take 1 tablet (75 mg total) by mouth 2 (two) times daily. 01/15/16   Ivery Quale, PA-C  HYDROcodone-acetaminophen (NORCO) 5-325 MG tablet Take 1 tablet by mouth every 6 (six) hours as needed for moderate pain. 09/14/15   Hope Orlene Och, NP  lisinopril-hydrochlorothiazide (PRINZIDE,ZESTORETIC) 20-12.5 MG per tablet Take 1 tablet by mouth 2 (  two) times daily.    Historical Provider, MD  LORazepam (ATIVAN) 0.5 MG tablet Take 0.5 mg by mouth 3 (three) times daily.    Historical Provider, MD  metFORMIN (GLUCOPHAGE) 500 MG tablet Take 500 mg by mouth 2 (two) times daily with a meal.    Historical Provider, MD  methocarbamol (ROBAXIN) 500 MG tablet Take 1 tablet (500 mg total) by mouth 2 (two) times daily. 09/14/15   Hope Orlene OchM Neese, NP  metoprolol tartrate (LOPRESSOR) 25 MG tablet Take 25 mg by mouth 2 (two) times daily.    Historical Provider, MD   BP 161/73 mmHg  Pulse 79  Temp(Src) 97.9 F (36.6 C) (Oral)  Resp 20  Ht 6' (1.829 m)  Wt 88.026 kg  BMI 26.31  kg/m2  SpO2 95%  LMP 01/18/2014 Physical Exam  Constitutional: She is oriented to person, place, and time. She appears well-developed and well-nourished.  Non-toxic appearance.  HENT:  Head: Normocephalic.  Right Ear: Tympanic membrane and external ear normal.  Left Ear: Tympanic membrane and external ear normal.  Eyes: EOM and lids are normal. Pupils are equal, round, and reactive to light.  Neck: Normal range of motion. Neck supple. Carotid bruit is not present.  Cardiovascular: Normal rate, regular rhythm, normal heart sounds, intact distal pulses and normal pulses.   Pulmonary/Chest: Breath sounds normal. No respiratory distress.  Abdominal: Soft. Bowel sounds are normal. There is no tenderness. There is no guarding.  Musculoskeletal:       Right knee: She exhibits decreased range of motion. She exhibits no effusion and no deformity. Tenderness found. Medial joint line tenderness noted.       Lumbar back: She exhibits tenderness, pain and spasm.       Back:  Lymphadenopathy:       Head (right side): No submandibular adenopathy present.       Head (left side): No submandibular adenopathy present.    She has no cervical adenopathy.  Neurological: She is alert and oriented to person, place, and time. She has normal strength. No cranial nerve deficit or sensory deficit.  Skin: Skin is warm and dry.  Psychiatric: She has a normal mood and affect. Her speech is normal.  Nursing note and vitals reviewed.   ED Course  Procedures (including critical care time) Labs Review Labs Reviewed - No data to display  Imaging Review No results found. I have personally reviewed and evaluated these images and lab results as part of my medical decision-making.   EKG Interpretation None      MDM Vital signs reviewed. Xray of the right knee is neg for fx or dislocation or effusion. Exam favors lumbar strain and knee contusion. Pt fitted with knee immobilizer. Rx for valium and voltaren  given to the patient. Pt will follow up with orthopedics if not improving.   Final diagnoses:  Contusion, knee, right, initial encounter  Lumbar strain, initial encounter    **I have reviewed nursing notes, vital signs, and all appropriate lab and imaging results for this patient.Ivery Quale*    Juvon Teater, PA-C 04/07/16 [id number]  Donnetta HutchingBrian Cook, MD 04/08/16 1515

## 2016-04-03 NOTE — ED Notes (Signed)
Pt fell yesterday hitting her R knee. Presents today with c/o R knee pain.

## 2016-04-03 NOTE — Discharge Instructions (Signed)
Your vital signs are within normal limits. The x-ray of your knee is negative for fracture or dislocation or effusion (fluid in the joint). Your examination favors strain/sprain of your lower back area, and a contusion to your knee. Please use the knee immobilizer for the next 7 days. Use Valium 3 times daily for spasm pain, and use diclofenac 2 times daily for pain and inflammation. Please call the [facility] clinic to establish a primary care physician for your healthcare needs. Heating pad to your back maybe helpful. Contusion A contusion is a deep bruise. Contusions happen when an injury causes bleeding under the skin. Symptoms of bruising include pain, swelling, and discolored skin. The skin may turn blue, purple, or yellow. HOME CARE   Rest the injured area.  If told, put ice on the injured area.  Put ice in a plastic bag.  Place a towel between your skin and the bag.  Leave the ice on for 20 minutes, 2-3 times per day.  If told, put light pressure (compression) on the injured area using an elastic bandage. Make sure the bandage is not too tight. Remove it and put it back on as told by your doctor.  If possible, raise (elevate) the injured area above the level of your heart while you are sitting or lying down.  Take over-the-counter and prescription medicines only as told by your doctor. GET HELP IF:  Your symptoms do not get better after several days of treatment.  Your symptoms get worse.  You have trouble moving the injured area. GET HELP RIGHT AWAY IF:   You have very bad pain.  You have a loss of feeling (numbness) in a hand or foot.  Your hand or foot turns pale or cold.   This information is not intended to replace advice given to you by your health care provider. Make sure you discuss any questions you have with your health care provider.   Document Released: 02/27/2008 Document Revised: 06/01/2015 Document Reviewed: 01/26/2015 Elsevier Interactive Patient Education  2016 [employer]  Lumbosacral Strain Lumbosacral strain is a strain of any of the parts that make up your lumbosacral vertebrae. Your lumbosacral vertebrae are the bones that make up the lower third of your backbone. Your lumbosacral vertebrae are held together by muscles and tough, fibrous tissue (ligaments).  CAUSES  A sudden blow to your back can cause lumbosacral strain. Also, anything that causes an excessive stretch of the muscles in the low back can cause this strain. This is typically seen when people exert themselves strenuously, fall, lift heavy objects, bend, or crouch repeatedly. RISK FACTORS  Physically demanding work.  Participation in pushing or pulling sports or sports that require a sudden twist of the back (tennis, golf, baseball).  Weight lifting.  Excessive lower back curvature.  Forward-tilted pelvis.  Weak back or abdominal muscles or both.  Tight hamstrings. SIGNS AND SYMPTOMS  Lumbosacral strain may cause pain in the area of your injury or pain that moves (radiates) down your leg.  DIAGNOSIS Your health care provider can often diagnose lumbosacral strain through a physical exam. In some cases, you may need tests such as X-ray exams.  TREATMENT  Treatment for your lower back injury depends on many factors that your clinician will have to evaluate. However, most treatment will include the use of anti-inflammatory medicines. HOME CARE INSTRUCTIONS   Avoid hard physical activities (tennis, racquetball, waterskiing) if you are not in proper physical condition for it. This may aggravate or create problems.  If you have a back problem, avoid sports requiring sudden body movements. Swimming and walking are generally safer activities.  Maintain good posture.  Maintain a healthy weight.  For acute conditions, you may put ice on the injured area.  Put ice in a plastic bag.  Place a towel between your skin and the bag.  Leave the ice on for 20 minutes, 2-3  times a day.  When the low back starts healing, stretching and strengthening exercises may be recommended. SEEK MEDICAL CARE IF:  Your back pain is getting worse.  You experience severe back pain not relieved with medicines. SEEK IMMEDIATE MEDICAL CARE IF:   You have numbness, tingling, weakness, or problems with the use of your arms or legs.  There is a change in bowel or bladder control.  You have increasing pain in any area of the body, including your belly (abdomen).  You notice shortness of breath, dizziness, or feel faint.  You feel sick to your stomach (nauseous), are throwing up (vomiting), or become sweaty.  You notice discoloration of your toes or legs, or your feet get very cold. MAKE SURE YOU:   Understand these instructions.  Will watch your condition.  Will get help right away if you are not doing well or get worse.   This information is not intended to replace advice given to you by your health care provider. Make sure you discuss any questions you have with your health care provider.   Document Released: 06/20/2005 Document Revised: 10/01/2014 Document Reviewed: 04/29/2013 Elsevier Interactive Patient Education 2016 [employer] You were

## 2016-05-15 ENCOUNTER — Emergency Department (HOSPITAL_COMMUNITY)
Admission: EM | Admit: 2016-05-15 | Discharge: 2016-05-15 | Disposition: A | Discharge status: Home / Self Care | Payer: Self-pay | Attending: Emergency Medicine | Admitting: Emergency Medicine

## 2016-05-15 ENCOUNTER — Encounter (HOSPITAL_COMMUNITY): Payer: Self-pay

## 2016-05-15 DIAGNOSIS — M545 Low back pain: Secondary | ICD-10-CM | POA: Insufficient documentation

## 2016-05-15 DIAGNOSIS — I10 Essential (primary) hypertension: Secondary | ICD-10-CM | POA: Insufficient documentation

## 2016-05-15 DIAGNOSIS — E119 Type 2 diabetes mellitus without complications: Secondary | ICD-10-CM | POA: Insufficient documentation

## 2016-05-15 DIAGNOSIS — G8929 Other chronic pain: Secondary | ICD-10-CM | POA: Insufficient documentation

## 2016-05-15 DIAGNOSIS — Z7984 Long term (current) use of oral hypoglycemic drugs: Secondary | ICD-10-CM | POA: Insufficient documentation

## 2016-05-15 DIAGNOSIS — M549 Dorsalgia, unspecified: Secondary | ICD-10-CM

## 2016-05-15 DIAGNOSIS — Z79899 Other long term (current) drug therapy: Secondary | ICD-10-CM | POA: Insufficient documentation

## 2016-05-15 DIAGNOSIS — F17210 Nicotine dependence, cigarettes, uncomplicated: Secondary | ICD-10-CM | POA: Insufficient documentation

## 2016-05-15 MED ORDER — DICLOFENAC SODIUM 75 MG PO TBEC: 75.0000 mg | DELAYED_RELEASE_TABLET | Freq: Two times a day (BID) | ORAL | 0 refills | Status: DC

## 2016-05-15 MED ORDER — HYDROMORPHONE HCL 1 MG/ML IJ SOLN
1.0000 mg | Freq: Once | INTRAMUSCULAR | Status: AC
  Administered 2016-05-15: 1 mg via INTRAMUSCULAR
  Filled 2016-05-15: qty 1

## 2016-05-15 MED ORDER — ONDANSETRON 8 MG PO TBDP
8.0000 mg | ORAL_TABLET | Freq: Once | ORAL | Status: AC
  Administered 2016-05-15: 8 mg via ORAL
  Filled 2016-05-15: qty 1

## 2016-05-15 NOTE — ED Provider Notes (Signed)
[facility] DEPT Provider Note   CSN: [id number] Arrival date & time: 05/15/16  1820     History   Chief Complaint Chief Complaint  Patient presents with  . Back Pain    HPI Melissa Huynh is a 55 y.o. female with a history of chronic low back pain with known degenerative disk disease presenting with worsened low back pain over the past 4 days.  She denies any new injury and states her pain is similar to prior episodes of back pain exacerbations.  There is no radiation into her legs and there has been no weakness or numbness in the lower extremities nor urinary or bowel retention or incontinence.  Patient does not have a history of cancer or IVDU.  The patient has tried tylenol and ibuprofen without significant relief of symptoms. She has been referred to [facility] to establish care in 6 days. .  The history is provided by the patient and the spouse.    Past Medical History:  Diagnosis Date  . Anxiety   . Back pain   . Diabetes mellitus without complication (HCC)   . DJD (degenerative joint disease) 2012  . High cholesterol   . Hypertension   . Migraine     There are no active problems to display for this patient.   Past Surgical History:  Procedure Laterality Date  . CESAREAN SECTION    . CHOLECYSTECTOMY    . TONSILLECTOMY    . TUBAL LIGATION      OB History    Gravida Para Term Preterm AB Living   6 3 3   3      SAB TAB Ectopic Multiple Live Births   3               Home Medications    Prior to Admission medications   Medication Sig Start Date End Date Taking? Authorizing Provider  diazepam (VALIUM) 5 MG tablet Take 1 tablet (5 mg total) by mouth 3 (three) times daily. 04/03/16   Ivery Quale, PA-C  diclofenac (VOLTAREN) 75 MG EC tablet Take 1 tablet (75 mg total) by mouth 2 (two) times daily. 04/03/16   Ivery Quale, PA-C  HYDROcodone-acetaminophen (NORCO) 5-325 MG tablet Take 1 tablet by mouth every 6 (six) hours as needed for moderate  pain. 09/14/15   Hope Orlene Och, NP  lisinopril-hydrochlorothiazide (PRINZIDE,ZESTORETIC) 20-12.5 MG per tablet Take 1 tablet by mouth 2 (two) times daily.    Historical Provider, MD  LORazepam (ATIVAN) 0.5 MG tablet Take 0.5 mg by mouth 3 (three) times daily.    Historical Provider, MD  metFORMIN (GLUCOPHAGE) 500 MG tablet Take 500 mg by mouth 2 (two) times daily with a meal.    Historical Provider, MD  methocarbamol (ROBAXIN) 500 MG tablet Take 1 tablet (500 mg total) by mouth 2 (two) times daily. 09/14/15   Hope Orlene Och, NP  metoprolol tartrate (LOPRESSOR) 25 MG tablet Take 25 mg by mouth 2 (two) times daily.    Historical Provider, MD    Family History Family History  Problem Relation Age of Onset  . Hypertension Mother   . Hypertension Sister     Social History Social History  Substance Use Topics  . Smoking status: Current Every Day Smoker    Packs/day: 0.50    Years: 30.00    Types: Cigarettes  . Smokeless tobacco: Never Used  . Alcohol use No     Allergies   Flexeril [cyclobenzaprine]; Other; Penicillins; Cheese; Toradol [ketorolac tromethamine]; and Tramadol  Review of Systems Review of Systems  Constitutional: Negative for fever.  Respiratory: Negative for shortness of breath.   Cardiovascular: Negative for chest pain and leg swelling.  Gastrointestinal: Negative for abdominal distention, abdominal pain and constipation.  Genitourinary: Negative for difficulty urinating, dysuria, flank pain, frequency and urgency.  Musculoskeletal: Positive for back pain. Negative for gait problem and joint swelling.  Skin: Negative for rash.  Neurological: Negative for weakness and numbness.     Physical Exam Updated Vital Signs BP 170/78 (BP Location: Left Arm)   Pulse 84   Temp 97.9 F (36.6 C) (Oral)   Resp 18   Ht 6' (1.829 m)   Wt 90.7 kg   LMP 01/18/2014   SpO2 98%   BMI 27.12 kg/m   Physical Exam  Constitutional: She appears well-developed and  well-nourished.  HENT:  Head: Normocephalic.  Eyes: Conjunctivae are normal.  Neck: Normal range of motion. Neck supple.  Cardiovascular: Normal rate and intact distal pulses.   Pedal pulses normal.  Pulmonary/Chest: Effort normal.  Abdominal: Soft. Bowel sounds are normal. She exhibits no distension and no mass.  Musculoskeletal: Normal range of motion. She exhibits no edema.       Lumbar back: She exhibits tenderness. She exhibits no swelling, no edema and no spasm.  Neurological: She is alert. She has normal strength. She displays no atrophy and no tremor. No sensory deficit. Gait normal.  Reflex Scores:      Patellar reflexes are 2+ on the right side and 2+ on the left side.      Achilles reflexes are 2+ on the right side and 2+ on the left side. No strength deficit noted in hip and knee flexor and extensor muscle groups.  Ankle flexion and extension intact.  Skin: Skin is warm and dry.  Psychiatric: She has a normal mood and affect.  Nursing note and vitals reviewed.    ED Treatments / Results  Labs (all labs ordered are listed, but only abnormal results are displayed) Labs Reviewed - No data to display  EKG  EKG Interpretation None       Radiology No results found.  Procedures Procedures (including critical care time)  Medications Ordered in ED Medications  HYDROmorphone (DILAUDID) injection 1 mg (1 mg Intramuscular Given 05/15/16 1937)  ondansetron (ZOFRAN-ODT) disintegrating tablet 8 mg (8 mg Oral Given 05/15/16 1936)     Initial Impression / Assessment and Plan / ED Course  I have reviewed the triage vital signs and the nursing notes.  Pertinent labs & imaging results that were available during my care of the patient were reviewed by me and considered in my medical decision making (see chart for details).  Clinical Course     and [facility] [place]. She is prescribed l[place]azepam monthly by [place]  No  narcotic prescriptions. She states she takes lorazepam prn.  Advised her to take tid for the next several days, added diclofenac. Advised ice tx, can alternate with heat.  F/u with pcp and with new pain management as planned.  She was given IM dilaudid here with moderate relief of sx.  She was ambulatory in dept.  No neuro deficit on exam or by history to suggest emergent or surgical presentation.  Discussed worsened sx that should prompt immediate re-evaluation including distal weakness, bowel/bladder retention/incontinence.    Final Clinical Impressions(s) / ED Diagnoses   Final diagnoses:  Chronic back pain    New Prescriptions Discharge Medication List as  of 05/15/2016  8:31 PM       Burgess AmorJulie Clifford Benninger, PA-C 05/16/16 1327    Maia PlanJoshua G Long, MD 05/16/16 [phone]

## 2016-05-15 NOTE — Discharge Instructions (Signed)
Take the medicines as directed.   Avoid lifting,  Bending,  Twisting or any other activity that worsens your pain over the next week.  Apply an  icepack  to your lower back for 10-15 minutes every few hours.  You should get rechecked if your symptoms are not better over the next 5 days,  Or you develop increased pain,  Weakness in your leg(s) or loss of bladder or bowel function - these are symptoms of a worse injury.

## 2016-05-15 NOTE — ED Triage Notes (Signed)
Pt reports pain in lower back since Saturday.  Denies injury.  Pt says the pain woke her up Saturday.

## 2016-07-21 IMAGING — DX DG THORACIC SPINE 3V
3 series · 3 of 3 positions shown · non-contrast
Comparison: July 02, 2014

CLINICAL DATA: Upper dorsalgia for 2 weeks

EXAM:
THORACIC SPINE - 2 VIEW + SWIMMERS

[t-spine ap]
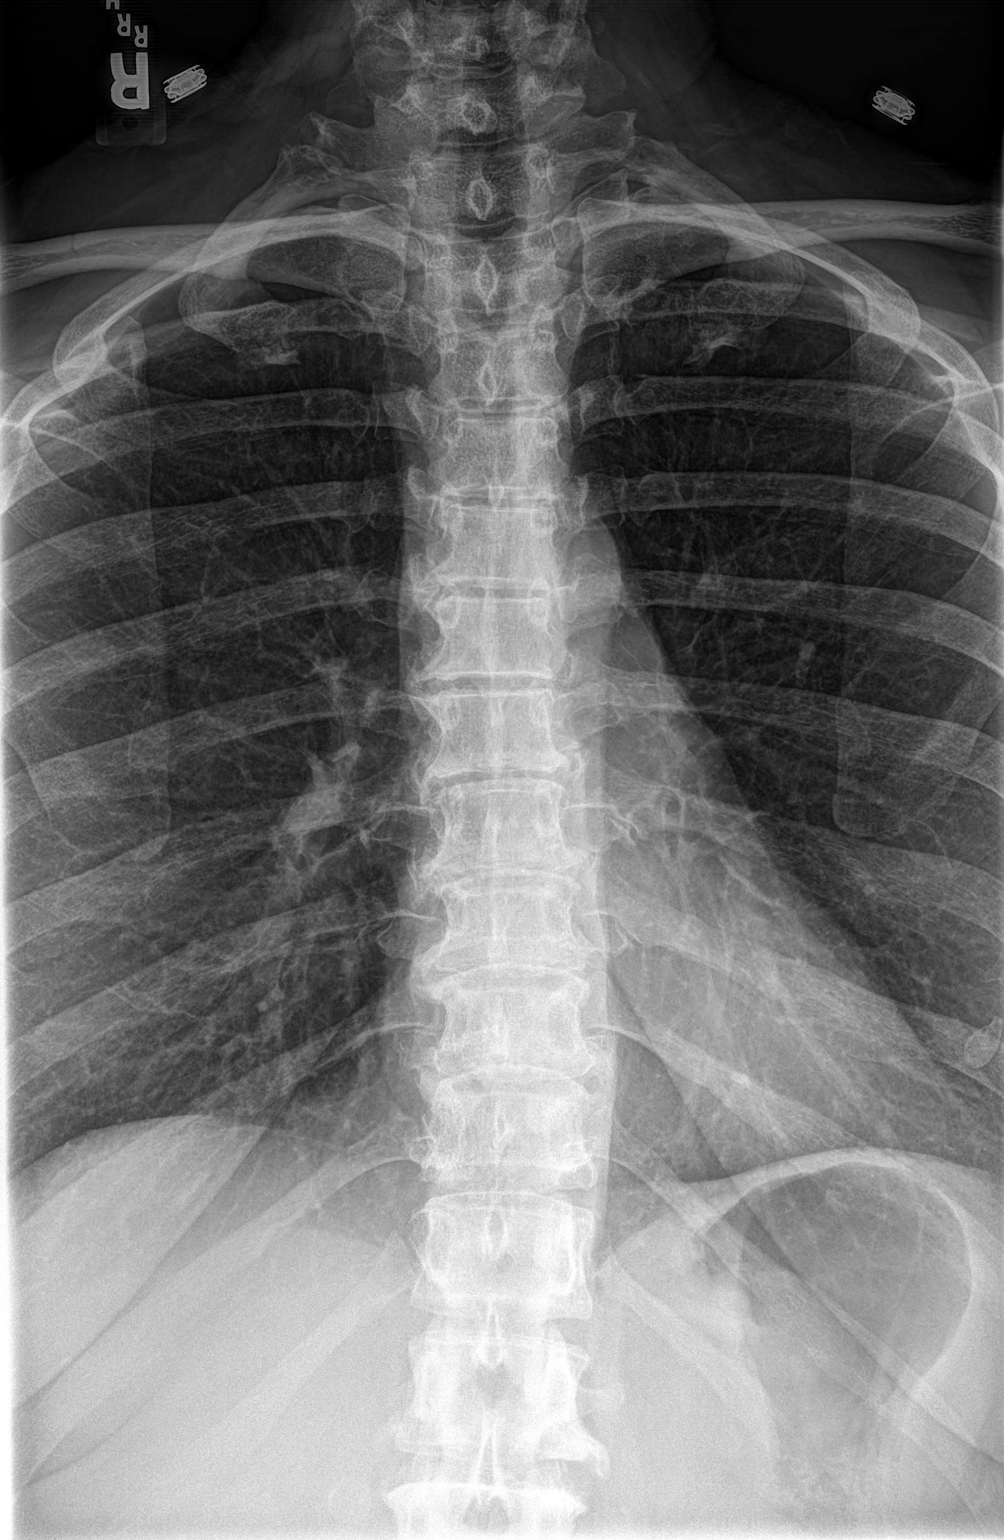

[t-spine lat]
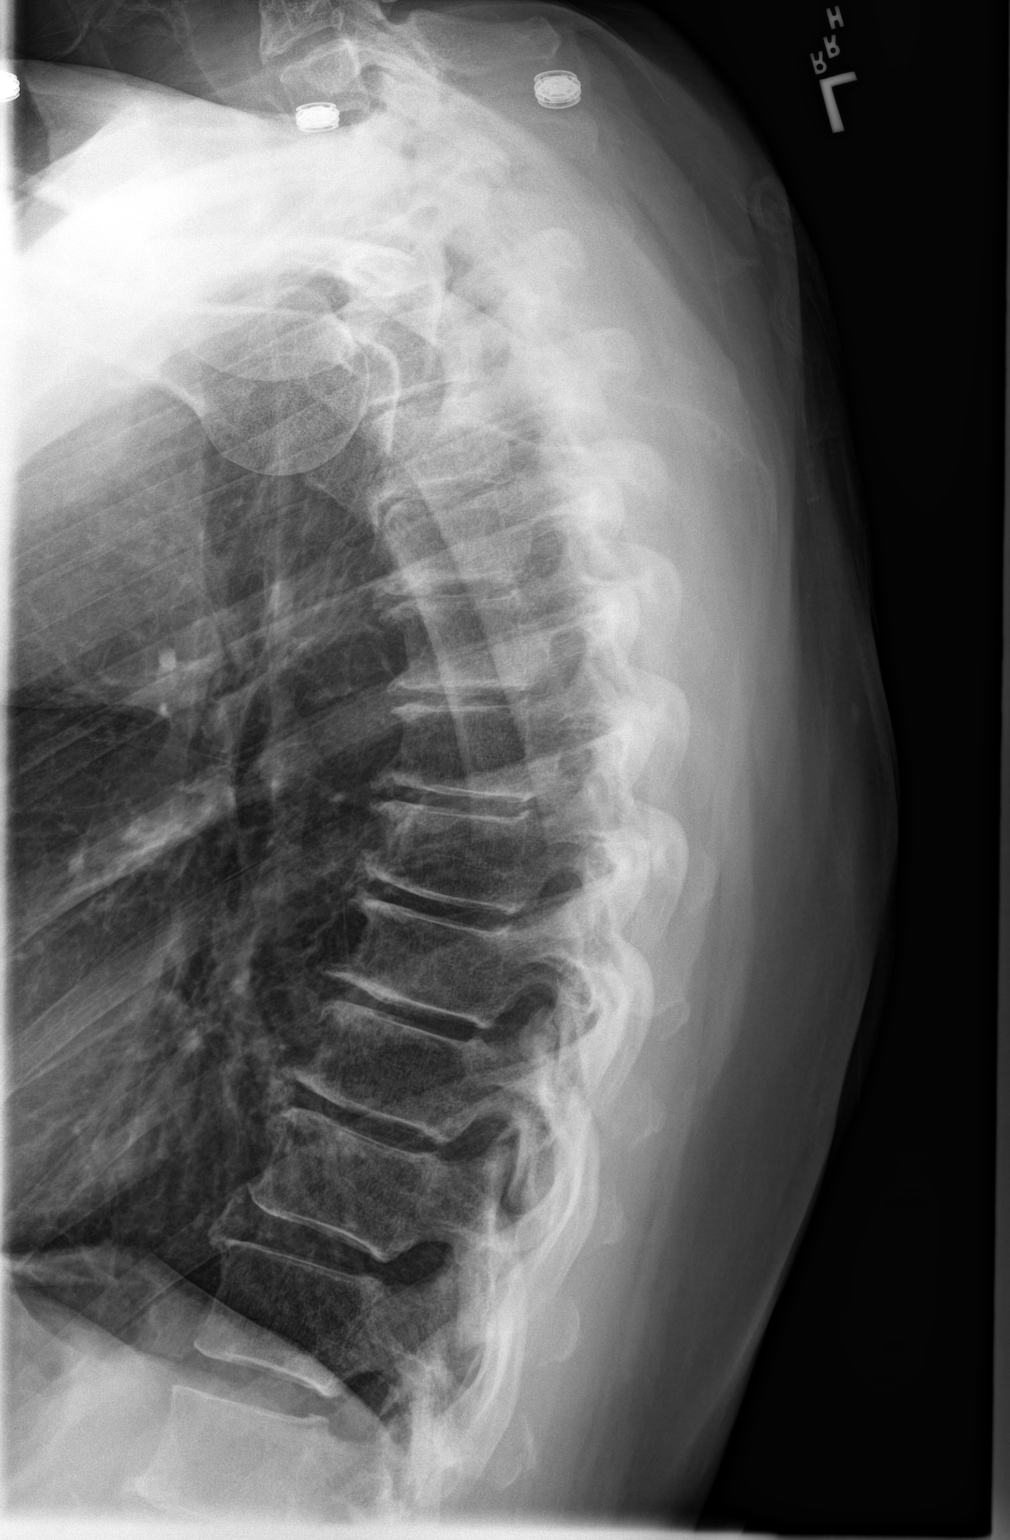

[t-spine swimmers]
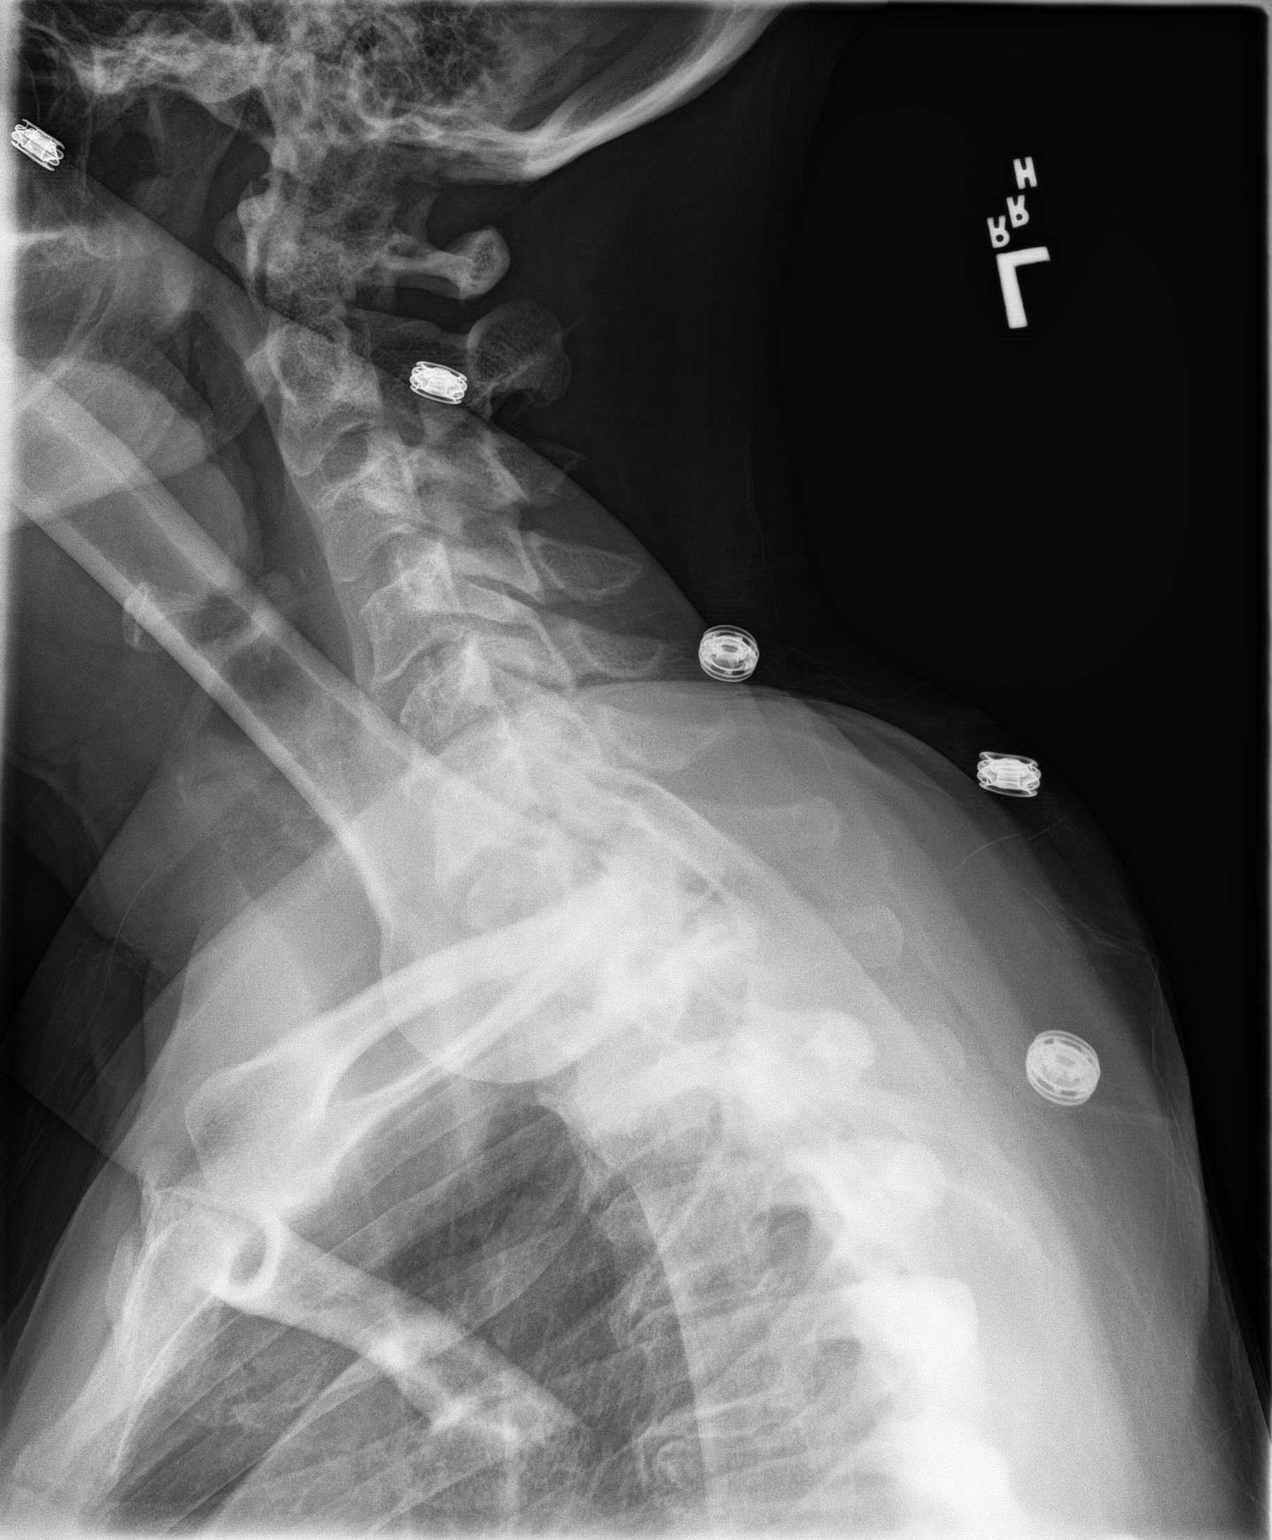

[3 of 3 positions shown; findings below may reference images not displayed]

FINDINGS: Frontal, lateral, and swimmer's views were obtained. There is no
fracture or spondylolisthesis. There is mild to moderate disc space
narrowing at multiple levels. There are multiple anterior
osteophytes throughout the thoracic region, stable. No erosive
change.
IMPRESSION: Stable multilevel osteoarthritic change. No fracture or
spondylolisthesis.

## 2016-07-30 ENCOUNTER — Emergency Department (HOSPITAL_COMMUNITY)
Admission: EM | Admit: 2016-07-30 | Discharge: 2016-07-30 | Disposition: A | Discharge status: Home / Self Care | Payer: Self-pay | Attending: Emergency Medicine | Admitting: Emergency Medicine

## 2016-07-30 ENCOUNTER — Encounter (HOSPITAL_COMMUNITY): Payer: Self-pay | Admitting: *Deleted

## 2016-07-30 DIAGNOSIS — M545 Low back pain, unspecified: Secondary | ICD-10-CM

## 2016-07-30 DIAGNOSIS — F17210 Nicotine dependence, cigarettes, uncomplicated: Secondary | ICD-10-CM | POA: Insufficient documentation

## 2016-07-30 DIAGNOSIS — Z7984 Long term (current) use of oral hypoglycemic drugs: Secondary | ICD-10-CM | POA: Insufficient documentation

## 2016-07-30 DIAGNOSIS — Z79899 Other long term (current) drug therapy: Secondary | ICD-10-CM | POA: Insufficient documentation

## 2016-07-30 DIAGNOSIS — E119 Type 2 diabetes mellitus without complications: Secondary | ICD-10-CM | POA: Insufficient documentation

## 2016-07-30 DIAGNOSIS — I10 Essential (primary) hypertension: Secondary | ICD-10-CM | POA: Insufficient documentation

## 2016-07-30 LAB — URINALYSIS, ROUTINE W REFLEX MICROSCOPIC
Bilirubin Urine: NEGATIVE
GLUCOSE, UA: NEGATIVE mg/dL
Hgb urine dipstick: NEGATIVE
Ketones, ur: NEGATIVE mg/dL
LEUKOCYTES UA: NEGATIVE
NITRITE: NEGATIVE
Protein, ur: NEGATIVE mg/dL
SPECIFIC GRAVITY, URINE: 1.02 (ref 1.005–1.030)
pH: 5.5 (ref 5.0–8.0)

## 2016-07-30 NOTE — Discharge Instructions (Signed)
Use heat on sore area 3 or 4 times a day.  Use Tylenol for pain.  Use your Robaxin muscle relaxer, as prescribed.

## 2016-07-30 NOTE — ED Provider Notes (Signed)
[facility]-EMERGENCY DEPT Provider Note   CSN: [id number] Arrival date & time: 07/30/16  1243     History   Chief Complaint Chief Complaint  Patient presents with  . Flank Pain    HPI Melissa Huynh is a 55 y.o. female.  She presents for evaluation of ongoing low back pain, with inability to see her pain management doctor, as planned since August. She apparently does not have the financial wherewithal, to proceed with the appointment. She is trying to get disability. She has Robaxin at home which she takes for muscle spasm. She denies bowel or urine incontinence. She has mild dysuria. She denies fever, nausea, vomiting, weakness or dizziness.  HPI  Past Medical History:  Diagnosis Date  . Anxiety   . Back pain   . Diabetes mellitus without complication (HCC)   . DJD (degenerative joint disease) 2012  . High cholesterol   . Hypertension   . Migraine     There are no active problems to display for this patient.   Past Surgical History:  Procedure Laterality Date  . CESAREAN SECTION    . CHOLECYSTECTOMY    . TONSILLECTOMY    . TUBAL LIGATION      OB History    Gravida Para Term Preterm AB Living   6 3 3   3      SAB TAB Ectopic Multiple Live Births   3               Home Medications    Prior to Admission medications   Medication Sig Start Date End Date Taking? Authorizing Provider  diazepam (VALIUM) 5 MG tablet Take 1 tablet (5 mg total) by mouth 3 (three) times daily. 04/03/16   Ivery QualeHobson Bryant, PA-C  diclofenac (VOLTAREN) 75 MG EC tablet Take 1 tablet (75 mg total) by mouth 2 (two) times daily. 05/15/16   Burgess AmorJulie Idol, PA-C  lisinopril-hydrochlorothiazide (PRINZIDE,ZESTORETIC) 20-12.5 MG per tablet Take 1 tablet by mouth 2 (two) times daily.    Historical Provider, MD  LORazepam (ATIVAN) 0.5 MG tablet Take 0.5 mg by mouth 3 (three) times daily.    Historical Provider, MD  metFORMIN (GLUCOPHAGE) 500 MG tablet Take 500 mg by mouth 2 (two) times daily with a meal.     Historical Provider, MD  methocarbamol (ROBAXIN) 500 MG tablet Take 1 tablet (500 mg total) by mouth 2 (two) times daily. 09/14/15   Hope Orlene OchM Neese, NP  metoprolol tartrate (LOPRESSOR) 25 MG tablet Take 25 mg by mouth 2 (two) times daily.    Historical Provider, MD    Family History Family History  Problem Relation Age of Onset  . Hypertension Mother   . Hypertension Sister     Social History Social History  Substance Use Topics  . Smoking status: Current Every Day Smoker    Packs/day: 0.50    Years: 30.00    Types: Cigarettes  . Smokeless tobacco: Never Used  . Alcohol use No     Allergies   Flexeril [cyclobenzaprine]; Other; Penicillins; Cheese; Toradol [ketorolac tromethamine]; and Tramadol   Review of Systems Review of Systems  All other systems reviewed and are negative.    Physical Exam Updated Vital Signs BP 151/93 (BP Location: Left Arm)   Pulse 86   Temp 97.7 F (36.5 C) (Oral)   Resp 20   Ht 5\' 10"  (1.778 m)   Wt 202 lb (91.6 kg)   LMP 01/18/2014   SpO2 96%   BMI 28.98 kg/m   Physical Exam  Constitutional: She is oriented to person, place, and time. She appears well-developed.  Appears older than stated age. She is overweight.  HENT:  Head: Normocephalic and atraumatic.  Eyes: Conjunctivae and EOM are normal. Pupils are equal, round, and reactive to light.  Neck: Normal range of motion and phonation normal. Neck supple.  Cardiovascular: Normal rate.   Pulmonary/Chest: Effort normal.  Musculoskeletal:  Splinting, left flank, secondary to pain with movement. No palpable tenderness of the lumbar region. Negative straight leg raising, bilaterally.  Neurological: She is alert and oriented to person, place, and time. She exhibits normal muscle tone.  Skin: Skin is warm and dry.  Psychiatric: She has a normal mood and affect. Her behavior is normal. Judgment and thought content normal.  Nursing note and vitals reviewed.    ED Treatments / Results    Labs (all labs ordered are listed, but only abnormal results are displayed) Labs Reviewed  URINALYSIS, ROUTINE W REFLEX MICROSCOPIC (NOT AT [facility])    EKG  EKG Interpretation None       Radiology No results found.  Procedures Procedures (including critical care time)  Medications Ordered in ED Medications - No data to display   Initial Impression / Assessment and Plan / ED Course  I have reviewed the triage vital signs and the nursing notes.  Pertinent labs & imaging results that were available during my care of the patient were reviewed by me and considered in my medical decision making (see chart for details).  Clinical Course     Medications - No data to display  Patient Vitals for the past 24 hrs:  BP Temp Temp src Pulse Resp SpO2 Height Weight  07/30/16 1736 151/93 - - 86 20 96 % - -  07/30/16 1247 174/92 97.7 F (36.5 C) Oral 80 20 99 % 5\' 10"  (1.778 m) 202 lb (91.6 kg)    6:00 PM Reevaluation with update and discussion. After initial assessment and treatment, an updated evaluation reveals No change in clinical status. Findings discussed with patient and all questions answered. Rafia Shedden L    Final Clinical Impressions(s) / ED Diagnoses   Final diagnoses:  Low back pain, non-specific    Ongoing, chronic, low back pain with history degenerative disc disease. Doubt cauda equina, fracture or urinary tract infection.  Nursing Notes Reviewed/ Care Coordinated Applicable Imaging Reviewed Interpretation of Laboratory Data incorporated into ED treatment  The patient appears reasonably screened and/or stabilized for discharge and I doubt any other medical condition or other [facility] requiring further screening, evaluation, or treatment in the ED at this time prior to discharge.  Plan: Home Medications- continue; Home Treatments- rest; return here if the recommended treatment, does not improve the symptoms; Recommended follow up- PCP prn   New  Prescriptions Current Discharge Medication List       Mancel BaleElliott Emmilia Sowder, MD 07/30/16 1801

## 2016-07-30 NOTE — ED Triage Notes (Signed)
Pt comes in with left flank pain starting yesterday at 1500. Denies any urinary problems including burning of blood in the urine. Denies any n/v/d.

## 2018-02-25 ENCOUNTER — Ambulatory Visit: Payer: Self-pay | Admitting: Orthopaedic Surgery

## 2018-03-26 ENCOUNTER — Ambulatory Visit: Payer: Self-pay | Admitting: Orthopaedic Surgery

## 2018-04-29 ENCOUNTER — Ambulatory Visit: Payer: Self-pay | Admitting: Orthopaedic Surgery

## 2020-03-02 ENCOUNTER — Other Ambulatory Visit: Payer: Self-pay

## 2020-03-02 ENCOUNTER — Ambulatory Visit: Payer: Medicaid Other | Admitting: Orthopaedic Surgery

## 2020-03-05 ENCOUNTER — Emergency Department (HOSPITAL_COMMUNITY)
Admission: EM | Admit: 2020-03-05 | Discharge: 2020-03-05 | Disposition: A | Discharge status: Home / Self Care | Payer: Medicaid Other | Attending: Emergency Medicine | Admitting: Emergency Medicine

## 2020-03-05 ENCOUNTER — Other Ambulatory Visit: Payer: Self-pay

## 2020-03-05 ENCOUNTER — Emergency Department (HOSPITAL_COMMUNITY): Payer: Medicaid Other

## 2020-03-05 ENCOUNTER — Encounter (HOSPITAL_COMMUNITY): Payer: Self-pay | Admitting: Emergency Medicine

## 2020-03-05 DIAGNOSIS — Y999 Unspecified external cause status: Secondary | ICD-10-CM | POA: Insufficient documentation

## 2020-03-05 DIAGNOSIS — M79605 Pain in left leg: Secondary | ICD-10-CM | POA: Diagnosis present

## 2020-03-05 DIAGNOSIS — E119 Type 2 diabetes mellitus without complications: Secondary | ICD-10-CM | POA: Diagnosis not present

## 2020-03-05 DIAGNOSIS — Y929 Unspecified place or not applicable: Secondary | ICD-10-CM | POA: Insufficient documentation

## 2020-03-05 DIAGNOSIS — F17210 Nicotine dependence, cigarettes, uncomplicated: Secondary | ICD-10-CM | POA: Diagnosis not present

## 2020-03-05 DIAGNOSIS — W07XXXA Fall from chair, initial encounter: Secondary | ICD-10-CM | POA: Insufficient documentation

## 2020-03-05 DIAGNOSIS — I10 Essential (primary) hypertension: Secondary | ICD-10-CM | POA: Diagnosis not present

## 2020-03-05 DIAGNOSIS — Y9389 Activity, other specified: Secondary | ICD-10-CM | POA: Insufficient documentation

## 2020-03-05 DIAGNOSIS — W19XXXA Unspecified fall, initial encounter: Secondary | ICD-10-CM

## 2020-03-05 MED ORDER — LIDOCAINE 5 % EX PTCH: 1.0000 | MEDICATED_PATCH | CUTANEOUS | 0 refills | Status: DC

## 2020-03-05 MED ORDER — HYDROCODONE-ACETAMINOPHEN 5-325 MG PO TABS
1.0000 | ORAL_TABLET | Freq: Once | ORAL | Status: AC
  Administered 2020-03-05: 1 via ORAL
  Filled 2020-03-05: qty 1

## 2020-03-05 MED ORDER — METHOCARBAMOL 500 MG PO TABS: 500.0000 mg | ORAL_TABLET | Freq: Three times a day (TID) | ORAL | 0 refills | Status: DC | PRN

## 2020-03-05 NOTE — ED Triage Notes (Signed)
Pt reports she fell to the floor from a chair and injured her left leg, reports pain to thigh and knee x 3 days, 188/100 triage BP-pt reports she just took her am dose of BP meds about 1-2 hr ago

## 2020-03-05 NOTE — Discharge Instructions (Addendum)
You were evaluated in the Emergency Department and after careful evaluation, we did not find any emergent condition requiring admission or further testing in the hospital.  Your exam/testing today was overall reassuring.  X-rays did not show any broken bones.  Your symptoms seem to be due to bruising.  Please use Tylenol and Motrin at home for discomfort.  For more significant pain you can use the Robaxin muscle relaxer and/or to the Lidoderm pain patches.  Please return to the Emergency Department if you experience any worsening of your condition.  We encourage you to follow up with a primary care provider.  Thank you for allowing [place] to be a part of your care.

## 2020-03-05 NOTE — ED Notes (Signed)
To Rad 

## 2020-03-05 NOTE — ED Notes (Signed)
Pt reports she fell from a chair 3 days ago with upper leg pain   She ambulates with a slight limp  Complains also of knee pain

## 2020-03-05 NOTE — ED Provider Notes (Signed)
[facility]-EM[facility]GENCY DEPT [facility] Emergency Department Provider Note MRN:  [id number]  Arrival date & time: 03/05/20     Chief Complaint   Leg Injury   History of Present Illness   Melissa Huynh is a 59 y.o. year-old female with a history of diabetes presenting to the ED with chief complaint of fall.  Patient explains that she was removing some curtains and standing on a chair when she felt like her left leg was "giving out" and then she fell to the ground landing on her left hip and leg.  Denies head trauma, no loss of consciousness, no neck or back pain, no chest pain or shortness of breath, no abdominal pain, no injuries to the arms.  Endorsing significant pain to the left hip, left thigh, left knee.  No other complaints.  Pain is moderate to severe, constant, worse with motion or palpation.  Review of Systems  A complete 10 system review of systems was obtained and all systems are negative except as noted in the HPI and PMH.   Patient's Health History    Past Medical History:  Diagnosis Date  . Anxiety   . Back pain   . Diabetes mellitus without complication (HCC)   . DJD (degenerative joint disease) 2012  . High cholesterol   . Hypertension   . Migraine     Past Surgical History:  Procedure Laterality Date  . CESAREAN SECTION    . CHOLECYSTECTOMY    . TONSILLECTOMY    . TUBAL LIGATION      Family History  Problem Relation Age of Onset  . Hypertension Mother   . Hypertension Sister     Social History   Socioeconomic History  . Marital status: Married    Spouse name: Not on file  . Number of children: Not on file  . Years of education: Not on file  . Highest education level: Not on file  Occupational History  . Not on file  Tobacco Use  . Smoking status: Current Every Day Smoker    Packs/day: 0.50    Years: 30.00    Pack years: 15.00    Types: Cigarettes  . Smokeless tobacco: Never Used  Vaping Use  . Vaping Use: Never used  Substance and Sexual  Activity  . Alcohol use: No  . Drug use: No  . Sexual activity: Yes    Birth control/protection: Surgical  Other Topics Concern  . Not on file  Social History Narrative  . Not on file   Social Determinants of Health   Financial Resource Strain:   . Difficulty of Paying Living Expenses:   Food Insecurity:   . Worried About Programme researcher, broadcasting/film/video in the Last Year:   . Barista in the Last Year:   Transportation Needs:   . Freight forwarder (Medical):   Marland Kitchen Lack of Transportation (Non-Medical):   Physical Activity:   . Days of Exercise per Week:   . Minutes of Exercise per Session:   Stress:   . Feeling of Stress :   Social Connections:   . Frequency of Communication with Friends and Family:   . Frequency of Social Gatherings with Friends and Family:   . Attends Religious Services:   . Active Member of Clubs or Organizations:   . Attends Banker Meetings:   Marland Kitchen Marital Status:   Intimate Partner Violence:   . Fear of Current or Ex-Partner:   . Emotionally Abused:   Marland Kitchen Physically Abused:   .  Sexually Abused:      Physical Exam   Vitals:   03/05/20 1717 03/05/20 1730  BP: (!) 183/97 (!) 167/93  Pulse: 60 62  Resp:    Temp:    SpO2: 98% 99%    CONSTITUTIONAL: Well-appearing, NAD NEURO:  Alert and oriented x 3, no focal deficits EYES:  eyes equal and reactive ENT/NECK:  no LAD, no JVD CARDIO: Regular rate, well-perfused, normal S1 and S2 PULM:  CTAB no wheezing or rhonchi GI/GU:  normal bowel sounds, non-distended, non-tender MSK/SPINE:  No gross deformities, no edema, tenderness palpation to the left hip, left thigh, left knee, reduced range of motion due to pain, neurovascularly intact distally SKIN:  no rash, atraumatic PSYCH:  Appropriate speech and behavior  *Additional and/or pertinent findings included in MDM below  Diagnostic and Interventional Summary    EKG Interpretation  Date/Time:    Ventricular Rate:    PR Interval:    QRS  Duration:   QT Interval:    QTC Calculation:   R Axis:     Text Interpretation:        Labs Reviewed - No data to display  DG Femur Portable Min 2 Views Left  Final Result    DG Knee Complete 4 Views Left  Final Result      Medications  HYDROcodone-acetaminophen (NORCO/VICODIN) 5-325 MG per tablet 1 tablet (1 tablet Oral Given 03/05/20 1639)     Procedures  /  Critical Care Procedures  ED Course and Medical Decision Making  I have reviewed the triage vital signs, the nursing notes, and pertinent available records from the EMR.  Listed above are laboratory and imaging tests that I personally ordered, reviewed, and interpreted and then considered in my medical decision making (see below for details).      Mechanical fall, x-ray to exclude fracture or dislocation, neurovascularly intact distally.  X-rays are negative, patient is appropriate for discharge with pain control.  Barth Kirks. [place]onia Small, [place] [email or web address]  Final Clinical Impressions(s) / ED Diagnoses     ICD-10-CM   1. Fall, initial encounter  W19.XXXA   2. Pain of left lower extremity  M79.605     ED Discharge Orders         Ordered    lidocaine (LIDODERM) 5 %  Every 24 hours     Discontinue  Reprint     03/05/20 1742    methocarbamol (ROBAXIN) 500 MG tablet  Every 8 hours PRN     Discontinue  Reprint     03/05/20 1742           Discharge Instructions Discussed with and Provided to Patient:     Discharge Instructions     You were evaluated in the Emergency Department and after careful evaluation, we did not find any emergent condition requiring admission or further testing in the hospital.  Your exam/testing today was overall reassuring.  X-rays did not show any broken bones.  Your symptoms seem to be due to bruising.  Please use Tylenol and Motrin at home for discomfort.  For more significant pain you can use the Robaxin muscle relaxer  and/or to the Lidoderm pain patches.  Please return to the Emergency Department if you experience any worsening of your condition.  We encourage you to follow up with a primary care provider.  Thank you for allowing [place] to be a part of your care.        Maudie Flakes,  MD 03/05/20 1743

## 2020-03-10 ENCOUNTER — Ambulatory Visit: Payer: Medicaid Other | Admitting: Orthopaedic Surgery

## 2020-03-10 ENCOUNTER — Encounter: Payer: Self-pay | Admitting: Orthopaedic Surgery

## 2020-03-10 ENCOUNTER — Telehealth: Payer: Self-pay | Admitting: Orthopaedic Surgery

## 2020-03-10 ENCOUNTER — Ambulatory Visit: Payer: Medicaid Other

## 2020-03-10 ENCOUNTER — Other Ambulatory Visit: Payer: Self-pay

## 2020-03-10 VITALS — BP 205/116 | HR 68 | Ht 71.0 in | Wt 195.0 lb

## 2020-03-10 DIAGNOSIS — G8929 Other chronic pain: Secondary | ICD-10-CM | POA: Diagnosis not present

## 2020-03-10 DIAGNOSIS — M25562 Pain in left knee: Secondary | ICD-10-CM

## 2020-03-10 MED ORDER — NAPROXEN 500 MG PO TABS: 500.0000 mg | ORAL_TABLET | Freq: Two times a day (BID) | ORAL | 5 refills | Status: DC

## 2020-03-10 NOTE — Progress Notes (Signed)
Subjective:    Patient ID: Melissa Huynh, female    DOB: 1961-01-31, 59 y.o.   MRN: [id number]  HPI She has long history of bilateral knee pain, more on the left.  She has no trauma. She has swelling of both knees, giving way, popping, no redness, no numbness.  She has taken Advil with no help. Heat, ice and rubs do not help.   Review of Systems  Constitutional: Positive for activity change.  Musculoskeletal: Positive for arthralgias, gait problem and joint swelling.  All other systems reviewed and are negative.  For Review of Systems, all other systems reviewed and are negative.  The following is a summary of the past history medically, past history surgically, known current medicines, social history and family history.  This information is gathered electronically by the computer from prior information and documentation.  I review this each visit and have found including this information at this point in the chart is beneficial and informative.   Past Medical History:  Diagnosis Date  . Anxiety   . Back pain   . Diabetes mellitus without complication (HCC)   . DJD (degenerative joint disease) 2012  . High cholesterol   . Hypertension   . Migraine     Past Surgical History:  Procedure Laterality Date  . CESAREAN SECTION    . CHOLECYSTECTOMY    . TONSILLECTOMY    . TUBAL LIGATION      Current Outpatient Medications on File Prior to Visit  Medication Sig Dispense Refill  . glimepiride (AMARYL) 1 MG tablet Take by mouth.    Marland Kitchen lisinopril-hydrochlorothiazide (PRINZIDE,ZESTORETIC) 20-12.5 MG per tablet Take 1 tablet by mouth 2 (two) times daily.    . metFORMIN (GLUCOPHAGE) 500 MG tablet Take 500 mg by mouth 2 (two) times daily with a meal.    . metoprolol tartrate (LOPRESSOR) 25 MG tablet Take 25 mg by mouth 2 (two) times daily.    . potassium chloride SA (KLOR-CON) 20 MEQ tablet Take 20 mEq by mouth daily.    . diazepam (VALIUM) 5 MG tablet Take 1 tablet (5 mg total) by mouth 3  (three) times daily. (Patient not taking: Reported on 03/10/2020) 15 tablet 0  . diclofenac (VOLTAREN) 75 MG EC tablet Take 1 tablet (75 mg total) by mouth 2 (two) times daily. (Patient not taking: Reported on 03/10/2020) 20 tablet 0  . lidocaine (LIDODERM) 5 % Place 1 patch onto the skin daily. Remove & Discard patch within 12 hours or as directed by MD (Patient not taking: Reported on 03/10/2020) 5 patch 0  . LORazepam (ATIVAN) 0.5 MG tablet Take 0.5 mg by mouth 3 (three) times daily. (Patient not taking: Reported on 03/10/2020)    . methocarbamol (ROBAXIN) 500 MG tablet Take 1 tablet (500 mg total) by mouth every 8 (eight) hours as needed for muscle spasms. (Patient not taking: Reported on 03/10/2020) 30 tablet 0   No current facility-administered medications on file prior to visit.    Social History   Socioeconomic History  . Marital status: Married    Spouse name: Not on file  . Number of children: Not on file  . Years of education: Not on file  . Highest education level: Not on file  Occupational History  . Not on file  Tobacco Use  . Smoking status: Current Every Day Smoker    Packs/day: 0.50    Years: 30.00    Pack years: 15.00    Types: Cigarettes  . Smokeless tobacco: Never Used  Vaping Use  . Vaping Use: Never used  Substance and Sexual Activity  . Alcohol use: No  . Drug use: No  . Sexual activity: Yes    Birth control/protection: Surgical  Other Topics Concern  . Not on file  Social History Narrative  . Not on file   Social Determinants of Health   Financial Resource Strain:   . Difficulty of Paying Living Expenses:   Food Insecurity:   . Worried About Charity fundraiser in the Last Year:   . Arboriculturist in the Last Year:   Transportation Needs:   . Film/video editor (Medical):   Marland Kitchen Lack of Transportation (Non-Medical):   Physical Activity:   . Days of Exercise per Week:   . Minutes of Exercise per Session:   Stress:   . Feeling of Stress :     Social Connections:   . Frequency of Communication with Friends and Family:   . Frequency of Social Gatherings with Friends and Family:   . Attends Religious Services:   . Active Member of Clubs or Organizations:   . Attends Archivist Meetings:   Marland Kitchen Marital Status:   Intimate Partner Violence:   . Fear of Current or Ex-Partner:   . Emotionally Abused:   Marland Kitchen Physically Abused:   . Sexually Abused:     Family History  Problem Relation Age of Onset  . Hypertension Mother   . Hypertension Sister     BP (!) 205/116   Pulse 68   Ht 5\' 11"  (1.803 m)   Wt 195 lb (88.5 kg)   LMP 01/18/2014   BMI 27.20 kg/m   Body mass index is 27.2 kg/m.     Objective:   Physical Exam Vitals and nursing note reviewed.  Constitutional:      Appearance: She is well-developed.  HENT:     Head: Normocephalic and atraumatic.  Eyes:     Conjunctiva/sclera: Conjunctivae normal.     Pupils: Pupils are equal, round, and reactive to light.  Cardiovascular:     Rate and Rhythm: Normal rate and regular rhythm.  Pulmonary:     Effort: Pulmonary effort is normal.  Abdominal:     Palpations: Abdomen is soft.  Musculoskeletal:     Cervical back: Normal range of motion and neck supple.       Legs:  Skin:    General: Skin is warm and dry.  Neurological:     Mental Status: She is alert and oriented to person, place, and time.     Cranial Nerves: No cranial nerve deficit.     Motor: No abnormal muscle tone.     Coordination: Coordination normal.     Deep Tendon Reflexes: Reflexes are normal and symmetric. Reflexes normal.  Psychiatric:        Behavior: Behavior normal.        Thought Content: Thought content normal.        Judgment: Judgment normal.    X-rays were done of the left knee, reported separately.       Assessment & Plan:   Encounter Diagnosis  Name Primary?  . Chronic pain of left knee Yes   PROCEDURE NOTE:  The patient requests injections of the left knee ,  verbal consent was obtained.  The left knee was prepped appropriately after time out was performed.   Sterile technique was observed and injection of 1 cc of Depo-Medrol 40 mg with several cc's of plain xylocaine. Anesthesia was  provided by ethyl chloride and a 20-gauge needle was used to inject the knee area. The injection was tolerated well.  A band aid dressing was applied.  The patient was advised to apply ice later today and tomorrow to the injection sight as needed.  I will see her in three weeks.  X-rays of the right knee in three weeks.  I will begin naprosyn 500 po bid pc.  Call if any problem.  Precautions discussed.   Electronically Signed Darreld Mclean, MD 6/17/202110:01 AM

## 2020-03-10 NOTE — Telephone Encounter (Signed)
Patient was seen this morning.  She wants a narcotic for her pain.  She states she uses [facility] in [place]

## 2020-03-10 NOTE — Telephone Encounter (Signed)
I spoke to Dr. Hilda Lias just as he was leaving the office for the week.   He said he had not seen the request from the patient for narcotic that I sent to him.  He said he discussed this with the patient when he saw her earlier and no, he would not give her any narcotics.  Dr. Hilda Lias asked that I call her back and let her know of this.    I did call the patient and advise what Dr. Hilda Lias stated.

## 2020-03-10 NOTE — Patient Instructions (Signed)
Steps to Quit Smoking Smoking tobacco is the leading cause of preventable death. It can affect almost every organ in the body. Smoking puts you and people around you at risk for many serious, long-lasting (chronic) diseases. Quitting smoking can be hard, but it is one of the best things that you can do for your health. It is never too late to quit. How do I get ready to quit? When you decide to quit smoking, make a plan to help you succeed. Before you quit:  Pick a date to quit. Set a date within the next 2 weeks to give you time to prepare.  Write down the reasons why you are quitting. Keep this list in places where you will see it often.  Tell your family, friends, and co-workers that you are quitting. Their support is important.  Talk with your doctor about the choices that may help you quit.  Find out if your health insurance will pay for these treatments.  Know the people, places, things, and activities that make you want to smoke (triggers). Avoid them. What first steps can I take to quit smoking?  Throw away all cigarettes at home, at work, and in your car.  Throw away the things that you use when you smoke, such as ashtrays and lighters.  Clean your car. Make sure to empty the ashtray.  Clean your home, including curtains and carpets. What can I do to help me quit smoking? Talk with your doctor about taking medicines and seeing a counselor at the same time. You are more likely to succeed when you do both.  If you are pregnant or breastfeeding, talk with your doctor about counseling or other ways to quit smoking. Do not take medicine to help you quit smoking unless your doctor tells you to do so. To quit smoking: Quit right away  Quit smoking totally, instead of slowly cutting back on how much you smoke over a period of time.  Go to counseling. You are more likely to quit if you go to counseling sessions regularly. Take medicine You may take medicines to help you quit. Some  medicines need a prescription, and some you can buy over-the-counter. Some medicines may contain a drug called nicotine to replace the nicotine in cigarettes. Medicines may:  Help you to stop having the desire to smoke (cravings).  Help to stop the problems that come when you stop smoking (withdrawal symptoms). Your doctor may ask you to use:  Nicotine patches, gum, or lozenges.  Nicotine inhalers or sprays.  Non-nicotine medicine that is taken by mouth. Find resources Find resources and other ways to help you quit smoking and remain smoke-free after you quit. These resources are most helpful when you use them often. They include:  Online chats with a counselor.  Phone quitlines.  Printed self-help materials.  Support groups or group counseling.  Text messaging programs.  Mobile phone apps. Use apps on your mobile phone or tablet that can help you stick to your quit plan. There are many free apps for mobile phones and tablets as well as websites. Examples include Quit Guide from the CDC and smokefree.gov  What things can I do to make it easier to quit?   Talk to your family and friends. Ask them to support and encourage you.  Call a phone quitline (1-800-QUIT-NOW), reach out to support groups, or work with a counselor.  Ask people who smoke to not smoke around you.  Avoid places that make you want to smoke,   such as: ? Bars. ? Parties. ? Smoke-break areas at work.  Spend time with people who do not smoke.  Lower the stress in your life. Stress can make you want to smoke. Try these things to help your stress: ? Getting regular exercise. ? Doing deep-breathing exercises. ? Doing yoga. ? Meditating. ? Doing a body scan. To do this, close your eyes, focus on one area of your body at a time from head to toe. Notice which parts of your body are tense. Try to relax the muscles in those areas. How will I feel when I quit smoking? Day 1 to 3 weeks Within the first 24 hours,  you may start to have some problems that come from quitting tobacco. These problems are very bad 2-3 days after you quit, but they do not often last for more than 2-3 weeks. You may get these symptoms:  Mood swings.  Feeling restless, nervous, angry, or annoyed.  Trouble concentrating.  Dizziness.  Strong desire for high-sugar foods and nicotine.  Weight gain.  Trouble pooping (constipation).  Feeling like you may vomit (nausea).  Coughing or a sore throat.  Changes in how the medicines that you take for other issues work in your body.  Depression.  Trouble sleeping (insomnia). Week 3 and afterward After the first 2-3 weeks of quitting, you may start to notice more positive results, such as:  Better sense of smell and taste.  Less coughing and sore throat.  Slower heart rate.  Lower blood pressure.  Clearer skin.  Better breathing.  Fewer sick days. Quitting smoking can be hard. Do not give up if you fail the first time. Some people need to try a few times before they succeed. Do your best to stick to your quit plan, and talk with your doctor if you have any questions or concerns. Summary  Smoking tobacco is the leading cause of preventable death. Quitting smoking can be hard, but it is one of the best things that you can do for your health.  When you decide to quit smoking, make a plan to help you succeed.  Quit smoking right away, not slowly over a period of time.  When you start quitting, seek help from your doctor, family, or friends. This information is not intended to replace advice given to you by your health care provider. Make sure you discuss any questions you have with your health care provider. Document Revised: 06/05/2019 Document Reviewed: 11/29/2018 Elsevier Patient Education  2020 [employer]  

## 2020-03-16 ENCOUNTER — Ambulatory Visit: Payer: Medicaid Other | Admitting: Orthopaedic Surgery

## 2020-03-31 ENCOUNTER — Ambulatory Visit: Payer: Medicaid Other | Admitting: Orthopaedic Surgery

## 2020-03-31 ENCOUNTER — Encounter: Payer: Self-pay | Admitting: Orthopaedic Surgery

## 2020-04-25 ENCOUNTER — Telehealth: Payer: Self-pay | Admitting: Orthopaedic Surgery

## 2020-04-25 NOTE — Telephone Encounter (Signed)
Patient and her husband called this morning requesting a narcotic.  I told Melissa Huynh that when she was here last on 03/10/20, I asked Dr. Hilda Lias on her behalf for pain medication and he said no, he would not give her narcotics.  He said he had already discussed that with her.  I did tell her also that at that time, she was to have come back in 2 weeks for an x-ray.  We have not seen her since then.  I tried to schedule her an appointment but she said she didn't feel good and had several family members that were not doing well and she just couldn't come in.  She started to get a little upset and rude and said she didn't understand why he felt like he did about giving her medication.  I told her again that she would need to come in to discuss that with the doctor.  She said to "forget it".  She said he doesn't need to see him and that she would find someone to take care of her problem.

## 2020-04-25 NOTE — Telephone Encounter (Signed)
Noted  

## 2020-07-06 ENCOUNTER — Other Ambulatory Visit: Payer: Self-pay | Admitting: *Deleted

## 2020-07-06 NOTE — Progress Notes (Signed)
Error

## 2021-09-05 ENCOUNTER — Other Ambulatory Visit: Payer: Self-pay | Admitting: Family Medicine

## 2021-09-05 ENCOUNTER — Other Ambulatory Visit (HOSPITAL_COMMUNITY): Payer: Self-pay | Admitting: Family Medicine

## 2021-09-05 DIAGNOSIS — R188 Other ascites: Secondary | ICD-10-CM

## 2021-09-06 ENCOUNTER — Encounter: Payer: Self-pay | Admitting: Gastroenterology

## 2021-10-04 ENCOUNTER — Encounter: Payer: Self-pay | Admitting: Gastroenterology

## 2021-11-01 ENCOUNTER — Other Ambulatory Visit: Payer: Self-pay

## 2021-11-01 ENCOUNTER — Emergency Department (HOSPITAL_COMMUNITY)
Admission: EM | Admit: 2021-11-01 | Discharge: 2021-11-02 | Disposition: A | Discharge status: Home / Self Care | Payer: Medicaid Other | Attending: Emergency Medicine | Admitting: Emergency Medicine

## 2021-11-01 ENCOUNTER — Encounter (HOSPITAL_COMMUNITY): Payer: Self-pay | Admitting: *Deleted

## 2021-11-01 DIAGNOSIS — Z79899 Other long term (current) drug therapy: Secondary | ICD-10-CM | POA: Insufficient documentation

## 2021-11-01 DIAGNOSIS — R1084 Generalized abdominal pain: Secondary | ICD-10-CM | POA: Diagnosis present

## 2021-11-01 DIAGNOSIS — Z7984 Long term (current) use of oral hypoglycemic drugs: Secondary | ICD-10-CM | POA: Diagnosis not present

## 2021-11-01 DIAGNOSIS — R188 Other ascites: Secondary | ICD-10-CM | POA: Insufficient documentation

## 2021-11-01 HISTORY — DX: Unspecified viral hepatitis C without hepatic coma: B19.20

## 2021-11-01 HISTORY — DX: Unspecified cirrhosis of liver: K74.60

## 2021-11-01 LAB — CBC WITH DIFFERENTIAL/PLATELET
Abs Immature Granulocytes: 0.02 10*3/uL (ref 0.00–0.07)
Basophils Absolute: 0 10*3/uL (ref 0.0–0.1)
Basophils Relative: 0 %
Eosinophils Absolute: 0.2 10*3/uL (ref 0.0–0.5)
Eosinophils Relative: 2 %
HCT: 37 % (ref 36.0–46.0)
Hemoglobin: 12.2 g/dL (ref 12.0–15.0)
Immature Granulocytes: 0 %
Lymphocytes Relative: 36 %
Lymphs Abs: 3.7 10*3/uL (ref 0.7–4.0)
MCH: 29.3 pg (ref 26.0–34.0)
MCHC: 33 g/dL (ref 30.0–36.0)
MCV: 88.9 fL (ref 80.0–100.0)
Monocytes Absolute: 1.1 10*3/uL — ABNORMAL HIGH (ref 0.1–1.0)
Monocytes Relative: 11 %
Neutro Abs: 5.2 10*3/uL (ref 1.7–7.7)
Neutrophils Relative %: 51 %
Platelets: 239 10*3/uL (ref 150–400)
RBC: 4.16 MIL/uL (ref 3.87–5.11)
RDW: 14.6 % (ref 11.5–15.5)
WBC: 10.2 10*3/uL (ref 4.0–10.5)
nRBC: 0 % (ref 0.0–0.2)

## 2021-11-01 LAB — COMPREHENSIVE METABOLIC PANEL
ALT: 21 U/L (ref 0–44)
AST: 47 U/L — ABNORMAL HIGH (ref 15–41)
Albumin: 2.7 g/dL — ABNORMAL LOW (ref 3.5–5.0)
Alkaline Phosphatase: 128 U/L — ABNORMAL HIGH (ref 38–126)
Anion gap: 8 (ref 5–15)
BUN: 15 mg/dL (ref 6–20)
CO2: 23 mmol/L (ref 22–32)
Calcium: 8.6 mg/dL — ABNORMAL LOW (ref 8.9–10.3)
Chloride: 104 mmol/L (ref 98–111)
Creatinine, Ser: 0.74 mg/dL (ref 0.44–1.00)
GFR, Estimated: >60 mL/min (ref >60)
Glucose, Bld: 124 mg/dL — ABNORMAL HIGH (ref 70–99)
Potassium: 3.8 mmol/L (ref 3.5–5.1)
Sodium: 135 mmol/L (ref 135–145)
Total Bilirubin: 0.8 mg/dL (ref 0.3–1.2)
Total Protein: 7.1 g/dL (ref 6.5–8.1)

## 2021-11-01 LAB — ALBUMIN, PLEURAL OR PERITONEAL FLUID: Albumin, Fluid: <1.5 g/dL

## 2021-11-01 LAB — LACTATE DEHYDROGENASE, PLEURAL OR PERITONEAL FLUID: LD, Fluid: 39 U/L — ABNORMAL HIGH (ref 3–23)

## 2021-11-01 LAB — PROTEIN, PLEURAL OR PERITONEAL FLUID: Total protein, fluid: <3 g/dL

## 2021-11-01 LAB — GLUCOSE, PLEURAL OR PERITONEAL FLUID: Glucose, Fluid: 124 mg/dL

## 2021-11-01 LAB — PROTIME-INR
INR: 1.3 — ABNORMAL HIGH (ref 0.8–1.2)
Prothrombin Time: 15.7 seconds — ABNORMAL HIGH (ref 11.4–15.2)

## 2021-11-01 NOTE — ED Notes (Signed)
Assisted MD with paracentesis, 3L of fluid removed from lower right abdomen. Pt is A&Ox4 and husband is at bedside.

## 2021-11-01 NOTE — ED Provider Notes (Signed)
[facility] EMERGENCY DEPARTMENT Provider Note   CSN: [id number] Arrival date & time: 11/01/21  1945     History  Chief Complaint  Patient presents with   Ascites    Melissa Huynh is a 61 y.o. female.  She has a history of hep C and cirrhosis.  Complaining of abdominal distention and pain that is been getting slowly progressive over the month.  She said her doctor sent her here to get fluid taken off.  She denies any fever.  No vomiting or diarrhea.  She has never had a paracentesis.  The history is provided by the patient.  Abdominal Pain Pain location:  Generalized Pain quality: aching   Pain radiates to:  Does not radiate Pain severity:  Moderate Onset quality:  Gradual Duration:  4 weeks Timing:  Constant Progression:  Worsening Chronicity:  New Context: not trauma   Relieved by:  Nothing Worsened by:  Movement Ineffective treatments:  None tried Associated symptoms: no chest pain, no cough, no diarrhea, no dysuria, no fever, no nausea, no shortness of breath, no sore throat and no vomiting       Home Medications Prior to Admission medications   Medication Sig Start Date End Date Taking? Authorizing Provider  diazepam (VALIUM) 5 MG tablet Take 1 tablet (5 mg total) by mouth 3 (three) times daily. Patient not taking: Reported on 03/10/2020 04/03/16   Ivery Quale, PA-C  diclofenac (VOLTAREN) 75 MG EC tablet Take 1 tablet (75 mg total) by mouth 2 (two) times daily. Patient not taking: Reported on 03/10/2020 05/15/16   Burgess Amor, PA-C  glimepiride (AMARYL) 1 MG tablet Take by mouth. 11/26/19   [provider]  lidocaine (LIDODERM) 5 % Place 1 patch onto the skin daily. Remove & Discard patch within 12 hours or as directed by MD Patient not taking: Reported on 03/10/2020 03/05/20   Sabas Sous, MD  lisinopril-hydrochlorothiazide (PRINZIDE,ZESTORETIC) 20-12.5 MG per tablet Take 1 tablet by mouth 2 (two) times daily.    [provider]  LORazepam (ATIVAN)  0.5 MG tablet Take 0.5 mg by mouth 3 (three) times daily. Patient not taking: Reported on 03/10/2020    [provider]  metFORMIN (GLUCOPHAGE) 500 MG tablet Take 500 mg by mouth 2 (two) times daily with a meal.    [provider]  methocarbamol (ROBAXIN) 500 MG tablet Take 1 tablet (500 mg total) by mouth every 8 (eight) hours as needed for muscle spasms. Patient not taking: Reported on 03/10/2020 03/05/20   Sabas Sous, MD  metoprolol tartrate (LOPRESSOR) 25 MG tablet Take 25 mg by mouth 2 (two) times daily.    [provider]  naproxen (NAPROSYN) 500 MG tablet Take 1 tablet (500 mg total) by mouth 2 (two) times daily with a meal. 03/10/20   Darreld Mclean, MD  potassium chloride SA (KLOR-CON) 20 MEQ tablet Take 20 mEq by mouth daily. 02/26/20   [provider]      Allergies    Flexeril [cyclobenzaprine], Other, Penicillins, Toradol [ketorolac tromethamine], and Tramadol    Review of Systems   Review of Systems  Constitutional:  Negative for fever.  HENT:  Negative for sore throat.   Respiratory:  Negative for cough and shortness of breath.   Cardiovascular:  Negative for chest pain.  Gastrointestinal:  Positive for abdominal pain. Negative for diarrhea, nausea and vomiting.  Genitourinary:  Negative for dysuria.  Skin:  Negative for rash.  Neurological:  Negative for headaches.   Physical Exam  Updated Vital Signs BP (!) 145/84 (BP Location: Right Arm)    Pulse 92    Temp 98 F (36.7 C) (Oral)    Resp 17    Ht 5\' 9"  (1.753 m)    Wt 88.6 kg    LMP 01/18/2014    SpO2 98%    BMI 28.84 kg/m  Physical Exam Vitals and nursing note reviewed.  Constitutional:      General: She is not in acute distress.    Appearance: Normal appearance. She is well-developed.  HENT:     Head: Normocephalic and atraumatic.  Eyes:     Conjunctiva/sclera: Conjunctivae normal.  Cardiovascular:     Rate and Rhythm: Normal rate and regular rhythm.     Heart sounds: No  murmur heard. Pulmonary:     Effort: Pulmonary effort is normal. No respiratory distress.     Breath sounds: Normal breath sounds.  Abdominal:     General: Abdomen is protuberant. There is distension.     Palpations: There is fluid wave.     Tenderness: There is no abdominal tenderness. There is no guarding or rebound.  Musculoskeletal:        General: No swelling.     Cervical back: Neck supple.  Skin:    General: Skin is warm and dry.     Capillary Refill: Capillary refill takes less than 2 seconds.  Neurological:     General: No focal deficit present.     Mental Status: She is alert.    ED Results / Procedures / Treatments   Labs (all labs ordered are listed, but only abnormal results are displayed) Labs Reviewed  COMPREHENSIVE METABOLIC PANEL - Abnormal; Notable for the following components:      Result Value   Glucose, Bld 124 (*)    Calcium 8.6 (*)    Albumin 2.7 (*)    AST 47 (*)    Alkaline Phosphatase 128 (*)    All other components within normal limits  CBC WITH DIFFERENTIAL/PLATELET - Abnormal; Notable for the following components:   Monocytes Absolute 1.1 (*)    All other components within normal limits  PROTIME-INR - Abnormal; Notable for the following components:   Prothrombin Time 15.7 (*)    INR 1.3 (*)    All other components within normal limits  LACTATE DEHYDROGENASE, PLEURAL OR PERITONEAL FLUID - Abnormal; Notable for the following components:   LD, Fluid 39 (*)    All other components within normal limits  BODY FLUID CELL COUNT WITH DIFFERENTIAL - Abnormal; Notable for the following components:   Appearance, Fluid CLEAR (*)    Monocyte-Macrophage-Serous Fluid 25 (*)    All other components within normal limits  CULTURE, BODY FLUID W GRAM STAIN -BOTTLE  GRAM STAIN  GLUCOSE, PLEURAL OR PERITONEAL FLUID  PROTEIN, PLEURAL OR PERITONEAL FLUID  ALBUMIN, PLEURAL OR PERITONEAL FLUID   PATHOLOGIST SMEAR REVIEW    EKG None  Radiology No results  found.  Procedures .Paracentesis  Date/Time: 11/01/2021 10:54 PM Performed by: 12/30/2021, MD Authorized by: Terrilee Files, MD   Consent:    Consent obtained:  Written   Consent given by:  Patient   Risks, benefits, and alternatives were discussed: yes     Risks discussed:  Bleeding, bowel perforation, infection and pain   Alternatives discussed:  No treatment and delayed treatment Universal protocol:    Procedure explained and questions answered to patient or proxy's satisfaction: yes     Relevant documents present and verified:  yes     Test results available: yes     Imaging studies available: yes     Required blood products, implants, devices, and special equipment available: yes     Site/side marked: yes     Immediately prior to procedure, a time out was called: yes     Patient identity confirmed:  Verbally with patient Pre-procedure details:    Procedure purpose:  Therapeutic   Preparation: Patient was prepped and draped in usual sterile fashion   Anesthesia:    Anesthesia method:  Local infiltration   Local anesthetic:  Lidocaine 1% w/o epi Procedure details:    Needle gauge:  18   Ultrasound guidance: yes     Puncture site:  R lower quadrant   Fluid removed amount:  3000   Fluid appearance:  Amber   Dressing:  4x4 sterile gauze Post-procedure details:    Procedure completion:  Tolerated well, no immediate complications    Medications Ordered in ED Medications - No data to display  ED Course/ Medical Decision Making/ A&P                           Medical Decision Making Amount and/or Complexity of Data Reviewed Labs: ordered.  This patient complains of abdominal distention; this involves an extensive number of treatment Options and is a complaint that carries with it a high risk of complications and Morbidity. The differential includes ascites, SBP, obstruction, liver failure, encephalopathy  I ordered, reviewed and interpreted labs, which  included CBC with normal white count normal hemoglobin, chemistries normal, LFTs consistent with some cirrhosis, INR slightly elevate Additional history obtained from patient's husband Previous records obtained and reviewed in epic, no recent admissions  After the interventions stated above, I reevaluated the patient and found patient to be symptomatically improved.  Her care is signed out to Dr. Clayborne Dana to follow-up on results of peritoneal fluid tests.  Likely can be discharged if no evidence of SBP to follow-up outpatient with her providers.  No indications for admission at this time          Final Clinical Impression(s) / ED Diagnoses Final diagnoses:  Generalized abdominal pain  Other ascites    Rx / DC Orders ED Discharge Orders     None         Terrilee Files, MD 11/02/21 [phone]

## 2021-11-01 NOTE — ED Triage Notes (Signed)
Pt with ascites and unable to see GI anytime soon. C/o trouble sleeping.  Pt with hx Hep. C and cirrhosis of liver.

## 2021-11-01 NOTE — ED Provider Notes (Signed)
11:23 PM Assumed care from Dr. Charm Barges, please see their note for full history, physical and decision making until this point. In brief this is a 61 y.o. year old female who presented to the ED tonight with Ascites     Pending first set of ascites labs and likely discharge.   Cell count reassuring. Appears well. Info given to try and get this done as an outpatient.   Discharge instructions, including strict return precautions for new or worsening symptoms, given. Patient and/or family verbalized understanding and agreement with the plan as described.   Labs, studies and imaging reviewed by myself and considered in medical decision making if ordered. Imaging interpreted by radiology.  Labs Reviewed  COMPREHENSIVE METABOLIC PANEL - Abnormal; Notable for the following components:      Result Value   Glucose, Bld 124 (*)    Calcium 8.6 (*)    Albumin 2.7 (*)    AST 47 (*)    Alkaline Phosphatase 128 (*)    All other components within normal limits  CBC WITH DIFFERENTIAL/PLATELET - Abnormal; Notable for the following components:   Monocytes Absolute 1.1 (*)    All other components within normal limits  PROTIME-INR - Abnormal; Notable for the following components:   Prothrombin Time 15.7 (*)    INR 1.3 (*)    All other components within normal limits  CULTURE, BODY FLUID W GRAM STAIN -BOTTLE  GRAM STAIN  LACTATE DEHYDROGENASE, PLEURAL OR PERITONEAL FLUID  GLUCOSE, PLEURAL OR PERITONEAL FLUID  PROTEIN, PLEURAL OR PERITONEAL FLUID  ALBUMIN, PLEURAL OR PERITONEAL FLUID   BODY FLUID CELL COUNT WITH DIFFERENTIAL  PATHOLOGIST SMEAR REVIEW    No orders to display    No follow-ups on file.    Melissa Huynh, Barbara Cower, MD 11/02/21 [phone]

## 2021-11-01 NOTE — Discharge Instructions (Signed)
You are seen in the emergency department for worsening abdominal distention from ascites.  You had 3 L of fluid taken off.  Please contact your primary care doctor for close follow-up.  Return to the emergency department if any worsening or concerning symptoms.

## 2021-11-02 LAB — BODY FLUID CELL COUNT WITH DIFFERENTIAL
Eos, Fluid: 0 %
Lymphs, Fluid: 67 %
Monocyte-Macrophage-Serous Fluid: 25 % — ABNORMAL LOW (ref 50–90)
Neutrophil Count, Fluid: 8 % (ref 0–25)
Other Cells, Fluid: 6 %
Total Nucleated Cell Count, Fluid: 171 cu mm (ref 0–1000)

## 2021-11-02 LAB — GRAM STAIN

## 2021-11-03 LAB — PATHOLOGIST SMEAR REVIEW

## 2021-11-06 LAB — CULTURE, BODY FLUID W GRAM STAIN -BOTTLE: Culture: NO GROWTH

## 2021-11-08 ENCOUNTER — Ambulatory Visit (HOSPITAL_COMMUNITY)
Admission: RE | Admit: 2021-11-08 | Discharge: 2021-11-08 | Disposition: A | Discharge status: Home / Self Care | Payer: Medicaid Other | Source: Ambulatory Visit | Attending: Emergency Medicine | Admitting: Emergency Medicine

## 2021-11-08 ENCOUNTER — Other Ambulatory Visit: Payer: Self-pay

## 2021-11-08 ENCOUNTER — Encounter (HOSPITAL_COMMUNITY): Payer: Self-pay

## 2021-11-08 DIAGNOSIS — K7460 Unspecified cirrhosis of liver: Secondary | ICD-10-CM | POA: Diagnosis not present

## 2021-11-08 DIAGNOSIS — R188 Other ascites: Secondary | ICD-10-CM | POA: Diagnosis present

## 2021-11-08 MED ORDER — ALBUMIN HUMAN 25 % IV SOLN: 50.0000 g | Freq: Once | INTRAVENOUS | Status: AC

## 2021-11-08 MED ORDER — ALBUMIN HUMAN 25 % IV SOLN
INTRAVENOUS | Status: AC
  Administered 2021-11-08: 50 g via INTRAVENOUS
  Filled 2021-11-08: qty 200

## 2021-11-08 NOTE — Procedures (Signed)
PreOperative Dx: Cirrhosis, ascites Postoperative Dx: Cirrhosis, ascites Procedure:   US guided paracentesis Radiologist:  Thornton Papas Anesthesia:  10 ml of1% lidocaine Specimen:  10 L of yellow ascitic fluid EBL:   < 1 ml Complications: none

## 2021-11-08 NOTE — Progress Notes (Signed)
PT tolerated right sided paracentesis procedure and 50G of IV albumin well today and 10 Liters of clear yellow fluid removed. PT verbalized understanding of discharge instructions and left via wheelchair with husband with no acute distress noted.

## 2021-11-13 ENCOUNTER — Ambulatory Visit: Payer: Medicaid Other | Admitting: Gastroenterology

## 2021-11-13 NOTE — Progress Notes (Deleted)
Referring Provider: Curlene Labrum, MD Primary Care Physician:  Curlene Labrum, MD Primary Gastroenterologist:  Dr. Abbey Chatters  Chief Complaint  Patient presents with   Cirrhosis    HPI:   Melissa Huynh is a 61 y.o. female presenting today at the request of Dr. Pleas Koch for ascites.   Abdominal ultrasound at [facility] 09/29/2021 with evidence of cirrhosis, large amount of ascites. Chronically mildly elevated liver enzymes.  HCVRNA detected with RNA quant <12 in January 2023.  Evaluated in the emergency room on 2/8 for generalized abdominal pain/distention secondary to ascites.  Bedside paracentesis yielding 3 L, negative for SBP.  Repeat paracentesis 11/08/2021 yielding 10 L.    MELD 9 11/01/21- INR slightly elevated at 1.3, platelets normal, AST 47, ALT normal, alk phos 128. Child Pugh B  Today:  Was told she still has 2-3 L left after last para.  Diagnosed with cirrhosis in January.  195 lbs when she went in on 2/8 and 169 today.  First thought she was just gaining weight. Started in October-November last year. Not sure what her base weight was.   Abdomen is almost back to normal.  Mild abdominal discomfort at the location of the paracentesis, but otherwise, no pain. At first, she had swelling in her legs, but this has resolved. Couldn't get her left shoe on. Back to normal. SOB resolved. Denies jaundice, mental status changes. Bruises easily.   Started spironolactone on 2/8, but hasn't taken since 2/15.   Hep C was treated with mavyret x 8 weeks. Finished treatment around December 1st. Dr. Grandville Silos with [facility].   Alcohol: None Illicit drug use: Never Has tattoos:  Professionally.  Blood transfusion: None.   Mother had 2 liver transplants. Unknown etiology of cirrhosis.  Oldest sister has history of breast and lung cancer.  No Fhx of colon cancer.  No Fhx of autoimmune conditions.   No prior colonoscopy.    No GERD symptoms, or dysphagia. No brbpr or melena.  Bowels are moving well.    Diuretics-  Cirrhosis labs, hepatitis labs ?Hep C treatment? EGD-  Hep C in April.   BMP in 1 week.  Monitor BP.  F/u 8 weeks.   Past Medical History:  Diagnosis Date   Anxiety    Back pain    Cirrhosis ([place])    Diabetes mellitus without complication (HCC)    DJD (degenerative joint disease) 09/24/2010   Hepatitis C    High cholesterol    Hypertension    Migraine     Past Surgical History:  Procedure Laterality Date   CESAREAN SECTION     CHOLECYSTECTOMY     TONSILLECTOMY     TUBAL LIGATION      Current Outpatient Medications  Medication Sig Dispense Refill   metFORMIN (GLUCOPHAGE) 500 MG tablet Take 500 mg by mouth 2 (two) times daily with a meal.     spironolactone (ALDACTONE) 25 MG tablet Take 25 mg by mouth daily. Take one daily     glimepiride (AMARYL) 1 MG tablet Take by mouth. (Patient not taking: Reported on 11/15/2021)     lisinopril-hydrochlorothiazide (PRINZIDE,ZESTORETIC) 20-12.5 MG per tablet Take 1 tablet by mouth 2 (two) times daily. (Patient not taking: Reported on 11/15/2021)     metoprolol tartrate (LOPRESSOR) 25 MG tablet Take 25 mg by mouth 2 (two) times daily. (Patient not taking: Reported on 11/15/2021)     naproxen (NAPROSYN) 500 MG tablet Take 1 tablet (500 mg total) by mouth 2 (two) times daily with  a meal. (Patient not taking: Reported on 11/15/2021) 60 tablet 5   potassium chloride SA (KLOR-CON) 20 MEQ tablet Take 20 mEq by mouth daily. (Patient not taking: Reported on 11/15/2021)     No current facility-administered medications for this visit.    Allergies as of 11/15/2021 - Review Complete 11/15/2021  Allergen Reaction Noted   Flexeril [cyclobenzaprine] Other (See Comments) 02/11/2013   Other  05/15/2016   Penicillins Hives 02/11/2013   Toradol [ketorolac tromethamine] Rash 02/11/2013   Tramadol Rash 02/11/2013    Family History  Problem Relation Age of Onset   Hypertension Mother    Hypertension Sister      Social History   Socioeconomic History   Marital status: Married    Spouse name: Not on file   Number of children: Not on file   Years of education: Not on file   Highest education level: Not on file  Occupational History   Not on file  Tobacco Use   Smoking status: Every Day    Packs/day: 0.50    Years: 30.00    Pack years: 15.00    Types: Cigarettes   Smokeless tobacco: Never  Vaping Use   Vaping Use: Never used  Substance and Sexual Activity   Alcohol use: No   Drug use: No   Sexual activity: Yes    Birth control/protection: Surgical  Other Topics Concern   Not on file  Social History Narrative   Not on file   Social Determinants of Health   Financial Resource Strain: Not on file  Food Insecurity: Not on file  Transportation Needs: Not on file  Physical Activity: Not on file  Stress: Not on file  Social Connections: Not on file  Intimate Partner Violence: Not on file    Review of Systems: Gen: Denies any fever, chills, anorexia. Occasional lightheadedness with position changes.  CV: Denies chest pain, heart palpitations. Resp: Denies shortness of breath at rest or with exertion. Denies wheezing or cough.  GI: Denies dysphagia or odynophagia. Denies jaundice, hematemesis, fecal incontinence. GU : Denies urinary burning, urinary frequency, urinary hesitancy MS: Denies joint pain, muscle weakness, cramps, or limitation of movement.  Derm: Denies rash, itching, dry skin Psych: Denies depression, anxiety, memory loss, and confusion Heme: Denies bruising, bleeding, and enlarged lymph nodes.  Physical Exam: BP 124/71    Pulse 95    Temp (!) 97.4 F (36.3 C) (Temporal)    Ht 5' 10"  (1.778 m)    Wt 169 lb 12.8 oz (77 kg)    LMP 01/18/2014    BMI 24.36 kg/m  General:   Alert and oriented. Pleasant and cooperative. Well-nourished and well-developed.  Head:  Normocephalic and atraumatic. Eyes:  Without icterus, sclera clear and conjunctiva pink.  Ears:  Normal  auditory acuity. Nose:  No deformity, discharge,  or lesions. Mouth:  No deformity or lesions, oral mucosa pink.  Neck:  Supple, without mass or thyromegaly. Lungs:  Clear to auscultation bilaterally. No wheezes, rales, or rhonchi. No distress.  Heart:  S1, S2 present without murmurs appreciated.  Abdomen:  +BS, soft, non-tender and non-distended. No HSM noted. No guarding or rebound. No masses appreciated.  Rectal:  Deferred  Msk:  Symmetrical without gross deformities. Normal posture. Pulses:  Normal pulses noted. Extremities:  Without clubbing or edema. Neurologic:  Alert and  oriented x4;  grossly normal neurologically. Skin:  Intact without significant lesions or rashes. Cervical Nodes:  No significant cervical adenopathy. Psych:  Alert and cooperative. Normal mood  and affect.

## 2021-11-15 ENCOUNTER — Ambulatory Visit (INDEPENDENT_AMBULATORY_CARE_PROVIDER_SITE_OTHER): Payer: Medicaid Other | Admitting: Gastroenterology

## 2021-11-15 ENCOUNTER — Telehealth: Payer: Self-pay | Admitting: Gastroenterology

## 2021-11-15 ENCOUNTER — Encounter: Payer: Self-pay | Admitting: Gastroenterology

## 2021-11-15 ENCOUNTER — Encounter: Payer: Self-pay | Admitting: *Deleted

## 2021-11-15 ENCOUNTER — Other Ambulatory Visit: Payer: Self-pay

## 2021-11-15 ENCOUNTER — Telehealth: Payer: Self-pay | Admitting: *Deleted

## 2021-11-15 VITALS — BP 124/71 | HR 95 | Temp 97.4°F | Ht 70.0 in | Wt 169.8 lb

## 2021-11-15 DIAGNOSIS — R188 Other ascites: Secondary | ICD-10-CM

## 2021-11-15 DIAGNOSIS — Z8619 Personal history of other infectious and parasitic diseases: Secondary | ICD-10-CM

## 2021-11-15 DIAGNOSIS — B1920 Unspecified viral hepatitis C without hepatic coma: Secondary | ICD-10-CM | POA: Insufficient documentation

## 2021-11-15 DIAGNOSIS — R768 Other specified abnormal immunological findings in serum: Secondary | ICD-10-CM | POA: Insufficient documentation

## 2021-11-15 DIAGNOSIS — Z1211 Encounter for screening for malignant neoplasm of colon: Secondary | ICD-10-CM

## 2021-11-15 DIAGNOSIS — K7460 Unspecified cirrhosis of liver: Secondary | ICD-10-CM | POA: Diagnosis not present

## 2021-11-15 MED ORDER — FUROSEMIDE 20 MG PO TABS: 20.0000 mg | ORAL_TABLET | Freq: Every day | ORAL | 3 refills | Status: DC

## 2021-11-15 MED ORDER — CLENPIQ 10-3.5-12 MG-GM -GM/160ML PO SOLN: 1.0000 | Freq: Once | ORAL | 0 refills | Status: AC

## 2021-11-15 MED ORDER — SPIRONOLACTONE 50 MG PO TABS: 50.0000 mg | ORAL_TABLET | Freq: Every day | ORAL | 3 refills | Status: DC

## 2021-11-15 NOTE — Telephone Encounter (Signed)
Left detailed VM with pre-op appt details.

## 2021-11-15 NOTE — H&P (View-Only) (Signed)
Referring Provider: Curlene Labrum, MD Primary Care Physician:  Curlene Labrum, MD Primary Gastroenterologist:  Dr. Abbey Chatters  Chief Complaint  Patient presents with   Cirrhosis    HPI:   Melissa Huynh is a 61 y.o. female presenting today at the request of Dr. Pleas Koch for ascites.   Abdominal ultrasound at [facility] 09/29/2021 with evidence of cirrhosis, large amount of ascites. Chronically mildly elevated liver enzymes.  HCVRNA detected with RNA quant <12 in January 2023.  Evaluated in the emergency room on 2/8 for generalized abdominal pain/distention secondary to ascites.  Bedside paracentesis yielding 3 L, negative for SBP.  Repeat paracentesis 11/08/2021 yielding 10 L.    MELD 9 11/01/21- INR slightly elevated at 1.3, platelets normal, AST 47, ALT normal, alk phos 128. Child Pugh B  Today:  Reports she was just diagnosed with cirrhosis in January.  She does have history of hep C and reports being treated with Mavyret x8 weeks by Dr. Grandville Silos with [facility].  She finished her treatment around December 1.  Not sure how she contracted hep C and was just diagnosed last year.  Prior to being diagnosed with cirrhosis, she reports she just thought she was gaining weight.  She started noticing weight gain in her abdomen around October or November of last year.  Cannot remember what her face weight was prior to this, but she has lost close to 30 pounds since having fluid removed from her abdomen.  She weighed 195 lbs on 2/8 and weighs 169 lbs today.  She had a total of 13 L removed from her abdomen and reports her abdomen is almost back to normal.  Still with some mild distention, but her abdomen is soft and she does not feel she needs another paracentesis.  Reports prior to having fluid removed, she was having a lot of abdominal discomfort, shortness of breath, trouble sleeping, decreased appetite as she was getting full quickly.  The symptoms have resolved.  She does not have any  swelling in her legs.  Reports she did have some swelling in her left lower extremity prior to having paracentesis.  States her foot was swollen and she could not get her shoe on.  This has also completely resolved since having fluid removed.  She was started on spironolactone on 2/8, but has not taken it since 2/15.  Was not sure if she should continue.  Denies jaundice, mental status changes.  Admits to easy bruising.  Alcohol: Never. Illicit drug use: Never Has tattoos: Has a couple that were completed professionally. Blood transfusion: None.   Mother had 2 liver transplants. Unknown etiology of cirrhosis.  Oldest sister has history of breast and lung cancer.  No Fhx of colon cancer.  No personal or Fhx of autoimmune conditions.   No prior colonoscopy.    No GERD symptoms, or dysphagia. No brbpr or melena. Bowels are moving well.   Past Medical History:  Diagnosis Date   Anxiety    Back pain    Cirrhosis ([place])    Diabetes mellitus without complication ([place])    DJD (degenerative joint disease) 09/24/2010   Hepatitis C    patient reports completing Mavyret December 1st 2022. HCV RNA <05 October 2021.   High cholesterol    Hypertension    Migraine     Past Surgical History:  Procedure Laterality Date   CESAREAN SECTION     CHOLECYSTECTOMY     TONSILLECTOMY     TUBAL LIGATION  Current Outpatient Medications  Medication Sig Dispense Refill   amitriptyline (ELAVIL) 50 MG tablet Take 50 mg by mouth at bedtime as needed for sleep.     CLENPIQ 10-3.5-12 MG-GM -GM/160ML SOLN Take 1 kit by mouth once for 1 dose. 320 mL 0   furosemide (LASIX) 20 MG tablet Take 1 tablet (20 mg total) by mouth daily. 30 tablet 3   glimepiride (AMARYL) 1 MG tablet Take by mouth. PRN if glucose is high     metFORMIN (GLUCOPHAGE) 500 MG tablet Take 1,000 mg by mouth 2 (two) times daily with a meal.     spironolactone (ALDACTONE) 50 MG tablet Take 1 tablet (50 mg total) by mouth daily. 30 tablet  3   lisinopril-hydrochlorothiazide (PRINZIDE,ZESTORETIC) 20-12.5 MG per tablet Take 1 tablet by mouth 2 (two) times daily. (Patient not taking: Reported on 11/15/2021)     metoprolol tartrate (LOPRESSOR) 25 MG tablet Take 25 mg by mouth 2 (two) times daily. (Patient not taking: Reported on 11/15/2021)     No current facility-administered medications for this visit.    Allergies as of 11/15/2021 - Review Complete 11/15/2021  Allergen Reaction Noted   Flexeril [cyclobenzaprine] Other (See Comments) 02/11/2013   Other  05/15/2016   Penicillins Hives 02/11/2013   Toradol [ketorolac tromethamine] Rash 02/11/2013   Tramadol Rash 02/11/2013    Family History  Problem Relation Age of Onset   Hypertension Mother    Cirrhosis Mother        Unknown etiology s/p transplant x2   Hypertension Sister    Breast cancer Sister    Lung cancer Sister     Social History   Socioeconomic History   Marital status: Married    Spouse name: Not on file   Number of children: Not on file   Years of education: Not on file   Highest education level: Not on file  Occupational History   Not on file  Tobacco Use   Smoking status: Every Day    Packs/day: 0.50    Years: 30.00    Pack years: 15.00    Types: Cigarettes   Smokeless tobacco: Never  Vaping Use   Vaping Use: Never used  Substance and Sexual Activity   Alcohol use: Never   Drug use: Never   Sexual activity: Yes    Birth control/protection: Surgical  Other Topics Concern   Not on file  Social History Narrative   Not on file   Social Determinants of Health   Financial Resource Strain: Not on file  Food Insecurity: Not on file  Transportation Needs: Not on file  Physical Activity: Not on file  Stress: Not on file  Social Connections: Not on file  Intimate Partner Violence: Not on file    Review of Systems: Gen: Denies any fever, chills, cold or flulike symptoms, presyncope, syncope. CV: Denies chest pain, heart  palpitations. Resp: Denies shortness of breath or cough. GI: See HPI GU : Denies urinary burning, urinary frequency, urinary hesitancy MS: Denies joint pain. Derm: Denies rash. Psych: Denies depression, anxiety. Heme: See HPI  Physical Exam: BP 124/71    Pulse 95    Temp (!) 97.4 F (36.3 C) (Temporal)    Ht 5' 10"  (1.778 m)    Wt 169 lb 12.8 oz (77 kg)    LMP 01/18/2014    BMI 24.36 kg/m  General:   Alert and oriented, peasant, thin upper and lower extremities, protuberant abdomen. Head:  Normocephalic and atraumatic. Eyes:  Without icterus, sclera  clear and conjunctiva pink.  Lungs:  Clear to auscultation bilaterally. No wheezes, rales, or rhonchi. No distress.  Heart:  S1, S2 present without murmurs appreciated.  Abdomen:  +BS,  protuberant, mildly distended abdomen that was very soft and non-tender to palpation. Evidence of fluid wave. Liver edge palpated below right costal margin. No guarding or rebound. No masses appreciated.  Rectal:  Deferred  Msk:  Symmetrical without gross deformities. Normal posture. Extremities:  Without edema. Neurologic:  Alert and  oriented x4;  grossly normal neurologically. Skin:  Intact without significant lesions or rashes. Psych:  Normal mood and affect.    Assessment: 61 year old female with history of type 2 diabetes, HTN, HLD, insomnia, hep C, presenting today at the request of Dr. Judd Lien for further evaluation of new onset decompensated cirrhosis.  Decompensated cirrhosis:  Cirrhosis was diagnosed via ultrasound in January 2023, possibly secondary to chronic hep C for which patient reports completing 8-week course of [place] in early December, HCVRNA <12 in January 2023. No history of alcohol or illicit drug use. MELD 9 on 11/01/21, Child Pugh B. Associated mildly elevated liver enzymes, INR 1.3, platelets remain normal. Unfortunately, cirrhosis has been complicated by significant ascites.  She underwent her first paracentesis on 2/8  yielding 3 L, negative for SBP.  Repeat paracentesis on 2/15 yielding 10 L, no labs checked.  Abdominal distention now much improved.  Still with some evidence of mild volume ascites on exam, but her abdomen is very soft and nontender without need for repeat paracentesis at this time.  No peripheral edema.  She was started on spironolactone 25 mg daily on 2/8, but has not taken this since 2/15.  She does not have evidence of hepatic encephalopathy or GI bleeding.  Recent hemoglobin within normal limits.  Kidney function and electrolytes also recently within normal limits.  I will plan to start her on Lasix 20 mg daily and spironolactone 50 mg daily with repeat BMP in 1 week.  Can consider increasing diuretics at that time if needed.  I am hopeful that starting diuretics will resolve her mild residual ascites and help prevent recurrent ascites.  We will also schedule EGD for variceal screening. While I suspect her cirrhosis is secondary to hepatitis C, we will also complete additional serologies to rule out autoimmune causes, Wilson's disease, alpha-1 antitrypsin, Hepatitis B.  We will also check first-ever AFP and evaluate for Hep A immunity. It is notable that her mother had cirrhosis of unknown etiology and has had 2 liver transplants.   History of hepatitis C: Patient reports history of hepatitis C of unknown source s/p 8-week course of Mavyret completed in early December per Dr. Grandville Silos with [facility].  HCVRNA in Care Everywhere on 10/12/2021 <12. Will plan to repeat HCV RNA in early April 2023 to confirm SVR 12 weeks after treatment.   Colon cancer screening: No prior colonoscopy.  No significant lower GI symptoms.  No alarm symptoms.  No family history of colon cancer. Will arrange colonoscopy in the near future.    Plan:  ANA, AMA, ASMA, immunoglobulins, ceruloplasmin, alpha-1 antitrypsin phenotype, hepatitis B surface antibody, hepatitis B surface antigen, hepatitis B core antibody total,  hepatitis A antibody total, AFP. EGD + colonoscopy with propofol with Dr. Abbey Chatters in the near future. The risks, benefits, and alternatives have been discussed with the patient in detail. The patient states understanding and desires to proceed. ASA 3 See separate instructions for diabetes medication adjustments. Start spironolactone 50 mg daily and Lasix 20  mg daily.  Prescriptions sent to pharmacy. Repeat BMP in 1 week. Advised to monitor BP daily and let me know of any blood pressures dropping below 90/60. Nutrition:  High-protein diet from a primarily plant-based diet. Avoid red meat.  No raw or undercooked meat, seafood, or shellfish. Low-fat/cholesterol/carbohydrate diet. Limit sodium to no more than 2000 mg/day including everything that you eat and drink. Recommend at least 30 minutes of aerobic and resistance exercise 3 days/week. [place] due in July 2023.  Advised to monitor for increasing abdominal distention, lower extremity edema, mental status changes, bright red blood per rectum, black stools and let us know if this occurs. Follow-up in about 6 weeks.   Aliene Altes, PA-C [facility] 11/15/2021

## 2021-11-15 NOTE — Progress Notes (Signed)
Referring Provider: Curlene Labrum, MD Primary Care Physician:  Curlene Labrum, MD Primary Gastroenterologist:  Dr. Abbey Chatters  Chief Complaint  Patient presents with   Cirrhosis    HPI:   Melissa Huynh is a 61 y.o. female presenting today at the request of Dr. Pleas Koch for ascites.   Abdominal ultrasound at [facility] 09/29/2021 with evidence of cirrhosis, large amount of ascites. Chronically mildly elevated liver enzymes.  HCVRNA detected with RNA quant <12 in January 2023.  Evaluated in the emergency room on 2/8 for generalized abdominal pain/distention secondary to ascites.  Bedside paracentesis yielding 3 L, negative for SBP.  Repeat paracentesis 11/08/2021 yielding 10 L.    MELD 9 11/01/21- INR slightly elevated at 1.3, platelets normal, AST 47, ALT normal, alk phos 128. Child Pugh B  Today:  Reports she was just diagnosed with cirrhosis in January.  She does have history of hep C and reports being treated with Mavyret x8 weeks by Dr. Grandville Silos with [facility].  She finished her treatment around December 1.  Not sure how she contracted hep C and was just diagnosed last year.  Prior to being diagnosed with cirrhosis, she reports she just thought she was gaining weight.  She started noticing weight gain in her abdomen around October or November of last year.  Cannot remember what her face weight was prior to this, but she has lost close to 30 pounds since having fluid removed from her abdomen.  She weighed 195 lbs on 2/8 and weighs 169 lbs today.  She had a total of 13 L removed from her abdomen and reports her abdomen is almost back to normal.  Still with some mild distention, but her abdomen is soft and she does not feel she needs another paracentesis.  Reports prior to having fluid removed, she was having a lot of abdominal discomfort, shortness of breath, trouble sleeping, decreased appetite as she was getting full quickly.  The symptoms have resolved.  She does not have any  swelling in her legs.  Reports she did have some swelling in her left lower extremity prior to having paracentesis.  States her foot was swollen and she could not get her shoe on.  This has also completely resolved since having fluid removed.  She was started on spironolactone on 2/8, but has not taken it since 2/15.  Was not sure if she should continue.  Denies jaundice, mental status changes.  Admits to easy bruising.  Alcohol: Never. Illicit drug use: Never Has tattoos: Has a couple that were completed professionally. Blood transfusion: None.   Mother had 2 liver transplants. Unknown etiology of cirrhosis.  Oldest sister has history of breast and lung cancer.  No Fhx of colon cancer.  No personal or Fhx of autoimmune conditions.   No prior colonoscopy.    No GERD symptoms, or dysphagia. No brbpr or melena. Bowels are moving well.   Past Medical History:  Diagnosis Date   Anxiety    Back pain    Cirrhosis ([place])    Diabetes mellitus without complication ([place])    DJD (degenerative joint disease) 09/24/2010   Hepatitis C    patient reports completing Mavyret December 1st 2022. HCV RNA <05 October 2021.   High cholesterol    Hypertension    Migraine     Past Surgical History:  Procedure Laterality Date   CESAREAN SECTION     CHOLECYSTECTOMY     TONSILLECTOMY     TUBAL LIGATION  Current Outpatient Medications  Medication Sig Dispense Refill   amitriptyline (ELAVIL) 50 MG tablet Take 50 mg by mouth at bedtime as needed for sleep.     CLENPIQ 10-3.5-12 MG-GM -GM/160ML SOLN Take 1 kit by mouth once for 1 dose. 320 mL 0   furosemide (LASIX) 20 MG tablet Take 1 tablet (20 mg total) by mouth daily. 30 tablet 3   glimepiride (AMARYL) 1 MG tablet Take by mouth. PRN if glucose is high     metFORMIN (GLUCOPHAGE) 500 MG tablet Take 1,000 mg by mouth 2 (two) times daily with a meal.     spironolactone (ALDACTONE) 50 MG tablet Take 1 tablet (50 mg total) by mouth daily. 30 tablet  3   lisinopril-hydrochlorothiazide (PRINZIDE,ZESTORETIC) 20-12.5 MG per tablet Take 1 tablet by mouth 2 (two) times daily. (Patient not taking: Reported on 11/15/2021)     metoprolol tartrate (LOPRESSOR) 25 MG tablet Take 25 mg by mouth 2 (two) times daily. (Patient not taking: Reported on 11/15/2021)     No current facility-administered medications for this visit.    Allergies as of 11/15/2021 - Review Complete 11/15/2021  Allergen Reaction Noted   Flexeril [cyclobenzaprine] Other (See Comments) 02/11/2013   Other  05/15/2016   Penicillins Hives 02/11/2013   Toradol [ketorolac tromethamine] Rash 02/11/2013   Tramadol Rash 02/11/2013    Family History  Problem Relation Age of Onset   Hypertension Mother    Cirrhosis Mother        Unknown etiology s/p transplant x2   Hypertension Sister    Breast cancer Sister    Lung cancer Sister     Social History   Socioeconomic History   Marital status: Married    Spouse name: Not on file   Number of children: Not on file   Years of education: Not on file   Highest education level: Not on file  Occupational History   Not on file  Tobacco Use   Smoking status: Every Day    Packs/day: 0.50    Years: 30.00    Pack years: 15.00    Types: Cigarettes   Smokeless tobacco: Never  Vaping Use   Vaping Use: Never used  Substance and Sexual Activity   Alcohol use: Never   Drug use: Never   Sexual activity: Yes    Birth control/protection: Surgical  Other Topics Concern   Not on file  Social History Narrative   Not on file   Social Determinants of Health   Financial Resource Strain: Not on file  Food Insecurity: Not on file  Transportation Needs: Not on file  Physical Activity: Not on file  Stress: Not on file  Social Connections: Not on file  Intimate Partner Violence: Not on file    Review of Systems: Gen: Denies any fever, chills, cold or flulike symptoms, presyncope, syncope. CV: Denies chest pain, heart  palpitations. Resp: Denies shortness of breath or cough. GI: See HPI GU : Denies urinary burning, urinary frequency, urinary hesitancy MS: Denies joint pain. Derm: Denies rash. Psych: Denies depression, anxiety. Heme: See HPI  Physical Exam: BP 124/71    Pulse 95    Temp (!) 97.4 F (36.3 C) (Temporal)    Ht 5' 10"  (1.778 m)    Wt 169 lb 12.8 oz (77 kg)    LMP 01/18/2014    BMI 24.36 kg/m  General:   Alert and oriented, peasant, thin upper and lower extremities, protuberant abdomen. Head:  Normocephalic and atraumatic. Eyes:  Without icterus, sclera  clear and conjunctiva pink.  Lungs:  Clear to auscultation bilaterally. No wheezes, rales, or rhonchi. No distress.  Heart:  S1, S2 present without murmurs appreciated.  Abdomen:  +BS,  protuberant, mildly distended abdomen that was very soft and non-tender to palpation. Evidence of fluid wave. Liver edge palpated below right costal margin. No guarding or rebound. No masses appreciated.  Rectal:  Deferred  Msk:  Symmetrical without gross deformities. Normal posture. Extremities:  Without edema. Neurologic:  Alert and  oriented x4;  grossly normal neurologically. Skin:  Intact without significant lesions or rashes. Psych:  Normal mood and affect.    Assessment: 61 year old female with history of type 2 diabetes, HTN, HLD, insomnia, hep C, presenting today at the request of Dr. Judd Lien for further evaluation of new onset decompensated cirrhosis.  Decompensated cirrhosis:  Cirrhosis was diagnosed via ultrasound in January 2023, possibly secondary to chronic hep C for which patient reports completing 8-week course of Justice in early December, HCVRNA <12 in January 2023. No history of alcohol or illicit drug use. MELD 9 on 11/01/21, Child Pugh B. Associated mildly elevated liver enzymes, INR 1.3, platelets remain normal. Unfortunately, cirrhosis has been complicated by significant ascites.  She underwent her first paracentesis on 2/8  yielding 3 L, negative for SBP.  Repeat paracentesis on 2/15 yielding 10 L, no labs checked.  Abdominal distention now much improved.  Still with some evidence of mild volume ascites on exam, but her abdomen is very soft and nontender without need for repeat paracentesis at this time.  No peripheral edema.  She was started on spironolactone 25 mg daily on 2/8, but has not taken this since 2/15.  She does not have evidence of hepatic encephalopathy or GI bleeding.  Recent hemoglobin within normal limits.  Kidney function and electrolytes also recently within normal limits.  I will plan to start her on Lasix 20 mg daily and spironolactone 50 mg daily with repeat BMP in 1 week.  Can consider increasing diuretics at that time if needed.  I am hopeful that starting diuretics will resolve her mild residual ascites and help prevent recurrent ascites.  We will also schedule EGD for variceal screening. While I suspect her cirrhosis is secondary to hepatitis C, we will also complete additional serologies to rule out autoimmune causes, Wilson's disease, alpha-1 antitrypsin, Hepatitis B.  We will also check first-ever AFP and evaluate for Hep A immunity. It is notable that her mother had cirrhosis of unknown etiology and has had 2 liver transplants.   History of hepatitis C: Patient reports history of hepatitis C of unknown source s/p 8-week course of Mavyret completed in early December per Dr. Grandville Silos with [facility].  HCVRNA in Care Everywhere on 10/12/2021 <12. Will plan to repeat HCV RNA in early April 2023 to confirm SVR 12 weeks after treatment.   Colon cancer screening: No prior colonoscopy.  No significant lower GI symptoms.  No alarm symptoms.  No family history of colon cancer. Will arrange colonoscopy in the near future.    Plan:  ANA, AMA, ASMA, immunoglobulins, ceruloplasmin, alpha-1 antitrypsin phenotype, hepatitis B surface antibody, hepatitis B surface antigen, hepatitis B core antibody total,  hepatitis A antibody total, AFP. EGD + colonoscopy with propofol with Dr. Abbey Chatters in the near future. The risks, benefits, and alternatives have been discussed with the patient in detail. The patient states understanding and desires to proceed. ASA 3 See separate instructions for diabetes medication adjustments. Start spironolactone 50 mg daily and Lasix 20  mg daily.  Prescriptions sent to pharmacy. Repeat BMP in 1 week. Advised to monitor BP daily and let me know of any blood pressures dropping below 90/60. Nutrition:  High-protein diet from a primarily plant-based diet. Avoid red meat.  No raw or undercooked meat, seafood, or shellfish. Low-fat/cholesterol/carbohydrate diet. Limit sodium to no more than 2000 mg/day including everything that you eat and drink. Recommend at least 30 minutes of aerobic and resistance exercise 3 days/week. [place] due in July 2023.  Advised to monitor for increasing abdominal distention, lower extremity edema, mental status changes, bright red blood per rectum, black stools and let us know if this occurs. Follow-up in about 6 weeks.   Aliene Altes, PA-C [facility] 11/15/2021

## 2021-11-15 NOTE — Telephone Encounter (Signed)
Instructions mailed and sent via mychart

## 2021-11-15 NOTE — Patient Instructions (Addendum)
Please have blood work completed at [facility] today.    You will also need to have repeat blood work in 1 week to recheck your kidney function and electrolytes.  We will arrange for you to have a colonoscopy and upper endoscopy in the near future with Dr. Marletta Lor.  Increase spironolactone to 50 mg daily.  Start Lasix 20 mg daily.  Take this at the same time as spironolactone.  Please monitor your blood pressure daily and let me know if your blood pressure drops below 90/60.   Nutrition:  High-protein diet from a primarily plant-based diet. Recommend 2 protein shakes daily.  Avoid red meat.  No raw or undercooked meat, seafood, or shellfish. Low-fat/cholesterol/carbohydrate diet. Limit sodium to no more than 2000 mg/day including everything that you eat and drink. Recommend at least 30 minutes of aerobic and resistance exercise 3 days/week.  Monitor for increased abdominal distention, swelling in your lower extremities, mental status changes, bright red blood per rectum, or black stools and let us know if this occurs.  We will follow-up with you in about 6 weeks.  Do not hesitate to call if you have any questions or concerns prior to your next visit.   Ermalinda Memos, PA-C [facility]

## 2021-11-15 NOTE — Telephone Encounter (Signed)
Spoke with patient. There was some confusion about what medications patient was taking when I saw her initially in regards to her diabetes medications. This has been clarified. She is taking 1000 mg metformin BID and only taking glimepiride if glucose is high. She also let me know that she is taking Amitryptiline 50 mg PRN for insomnia.   [facility] Clinical Pool:  Please send new instructions with the following medication changes to patient for her upcoming procedures; I have relayed the information verbally:   1 day prior to procedure: Take one half dose of metformin (500 mg in the morning and evening), one half dose of glimepiride if needed. Day of procedure: No morning diabetes medications.

## 2021-11-22 ENCOUNTER — Other Ambulatory Visit: Payer: Self-pay | Admitting: *Deleted

## 2021-11-22 ENCOUNTER — Telehealth: Payer: Self-pay | Admitting: Gastroenterology

## 2021-11-22 DIAGNOSIS — R188 Other ascites: Secondary | ICD-10-CM

## 2021-11-22 DIAGNOSIS — R772 Abnormality of alphafetoprotein: Secondary | ICD-10-CM

## 2021-11-22 DIAGNOSIS — K7460 Unspecified cirrhosis of liver: Secondary | ICD-10-CM

## 2021-11-22 NOTE — Telephone Encounter (Signed)
I have mailed new instructions to address as requested ?

## 2021-11-22 NOTE — Telephone Encounter (Signed)
Spoke with patient this morning.  She tells me that she cannot find her prep instructions or any information about her preop appointment.  I let her know according to the letter that we gave her previously, she is to be at [facility] at 930 on 3/7 for preop. ? ?She also tells me that she is not staying at her home address, but is staying with her daughter in [place].  Addresses [address], [place] [address].  She also tells me she has not been able to log into her MyChart due to phone issues. ? ?Mindy:  ?Can you please re-mail colon prep instructions and information regarding preop appointment to the new address listed above?  ?

## 2021-11-24 NOTE — Patient Instructions (Signed)
Melissa Huynh  11/24/2021     @PREFPERIOPPHARMACY @   Your procedure is scheduled on  11/30/2021.   Report to 01/30/2022 at  0700 A.M.   Call this number if you have problems the morning of surgery:  [phone]   Follow the diet and prep instructions given to you by the office.    DO NOT take any medications for diabetes the morning of your procedure.    Take these medicines the morning of surgery with A SIP OF WATER                                         clonazepam.     Do not wear jewelry, make-up or nail polish.  Do not wear lotions, powders, or perfumes, or deodorant.  Do not shave 48 hours prior to surgery.  Men may shave face and neck.  Do not bring valuables to the hospital.  [facility] is not responsible for any belongings or valuables.  Contacts, dentures or bridgework may not be worn into surgery.  Leave your suitcase in the car.  After surgery it may be brought to your room.  For patients admitted to the hospital, discharge time will be determined by your treatment team.  Patients discharged the day of surgery will not be allowed to drive home and must have someone with them for 24 hours.    Special instructions:   DO NOT smoke tobacco or vape for 24 hours before your procedure.  Please read over the following fact sheets that you were given. Anesthesia Post-op Instructions and Care and Recovery After Surgery      Upper Endoscopy, Adult, Care After This sheet gives you information about how to care for yourself after your procedure. Your health care provider may also give you more specific instructions. If you have problems or questions, contact your health care provider. What can I expect after the procedure? After the procedure, it is common to have: A sore throat. Mild stomach pain or discomfort. Bloating. Nausea. Follow these instructions at home:  Follow instructions from your health care provider about what to eat or drink after your  procedure. Return to your normal activities as told by your health care provider. Ask your health care provider what activities are safe for you. Take over-the-counter and prescription medicines only as told by your health care provider. If you were given a sedative during the procedure, it can affect you for several hours. Do not drive or operate machinery until your health care provider says that it is safe. Keep all follow-up visits as told by your health care provider. This is important. Contact a health care provider if you have: A sore throat that lasts longer than one day. Trouble swallowing. Get help right away if: You vomit blood or your vomit looks like coffee grounds. You have: A fever. Bloody, black, or tarry stools. A severe sore throat or you cannot swallow. Difficulty breathing. Severe pain in your chest or abdomen. Summary After the procedure, it is common to have a sore throat, mild stomach discomfort, bloating, and nausea. If you were given a sedative during the procedure, it can affect you for several hours. Do not drive or operate machinery until your health care provider says that it is safe. Follow instructions from your health care provider about what to eat or drink after  your procedure. Return to your normal activities as told by your health care provider. This information is not intended to replace advice given to you by your health care provider. Make sure you discuss any questions you have with your health care provider. Document Revised: 07/17/2019 Document Reviewed: 02/10/2018 Elsevier Patient Education  2022 Elsevier Inc. Colonoscopy, Adult, Care After This sheet gives you information about how to care for yourself after your procedure. Your health care provider may also give you more specific instructions. If you have problems or questions, contact your health care provider. What can I expect after the procedure? After the procedure, it is common to  have: A small amount of blood in your stool for 24 hours after the procedure. Some gas. Mild cramping or bloating of your abdomen. Follow these instructions at home: Eating and drinking  Drink enough fluid to keep your urine pale yellow. Follow instructions from your health care provider about eating or drinking restrictions. Resume your normal diet as instructed by your health care provider. Avoid heavy or fried foods that are hard to digest. Activity Rest as told by your health care provider. Avoid sitting for a long time without moving. Get up to take short walks every 1-2 hours. This is important to improve blood flow and breathing. Ask for help if you feel weak or unsteady. Return to your normal activities as told by your health care provider. Ask your health care provider what activities are safe for you. Managing cramping and bloating  Try walking around when you have cramps or feel bloated. Apply heat to your abdomen as told by your health care provider. Use the heat source that your health care provider recommends, such as a moist heat pack or a heating pad. Place a towel between your skin and the heat source. Leave the heat on for 20-30 minutes. Remove the heat if your skin turns bright red. This is especially important if you are unable to feel pain, heat, or cold. You may have a greater risk of getting burned. General instructions If you were given a sedative during the procedure, it can affect you for several hours. Do not drive or operate machinery until your health care provider says that it is safe. For the first 24 hours after the procedure: Do not sign important documents. Do not drink alcohol. Do your regular daily activities at a slower pace than normal. Eat soft foods that are easy to digest. Take over-the-counter and prescription medicines only as told by your health care provider. Keep all follow-up visits as told by your health care provider. This is  important. Contact a health care provider if: You have blood in your stool 2-3 days after the procedure. Get help right away if you have: More than a small spotting of blood in your stool. Large blood clots in your stool. Swelling of your abdomen. Nausea or vomiting. A fever. Increasing pain in your abdomen that is not relieved with medicine. Summary After the procedure, it is common to have a small amount of blood in your stool. You may also have mild cramping and bloating of your abdomen. If you were given a sedative during the procedure, it can affect you for several hours. Do not drive or operate machinery until your health care provider says that it is safe. Get help right away if you have a lot of blood in your stool, nausea or vomiting, a fever, or increased pain in your abdomen. This information is not intended to replace advice  given to you by your health care provider. Make sure you discuss any questions you have with your health care provider. Document Revised: 07/17/2019 Document Reviewed: 04/06/2019 Elsevier Patient Education  2022 Elsevier Inc. Monitored Anesthesia Care, Care After This sheet gives you information about how to care for yourself after your procedure. Your health care provider may also give you more specific instructions. If you have problems or questions, contact your health care provider. What can I expect after the procedure? After the procedure, it is common to have: Tiredness. Forgetfulness about what happened after the procedure. Impaired judgment for important decisions. Nausea or vomiting. Some difficulty with balance. Follow these instructions at home: For the time period you were told by your health care provider:   Rest as needed. Do not participate in activities where you could fall or become injured. Do not drive or use machinery. Do not drink alcohol. Do not take sleeping pills or medicines that cause drowsiness. Do not make important  decisions or sign legal documents. Do not take care of children on your own. Eating and drinking Follow the diet that is recommended by your health care provider. Drink enough fluid to keep your urine pale yellow. If you vomit: Drink water, juice, or soup when you can drink without vomiting. Make sure you have little or no nausea before eating solid foods. General instructions Have a responsible adult stay with you for the time you are told. It is important to have someone help care for you until you are awake and alert. Take over-the-counter and prescription medicines only as told by your health care provider. If you have sleep apnea, surgery and certain medicines can increase your risk for breathing problems. Follow instructions from your health care provider about wearing your sleep device: Anytime you are sleeping, including during daytime naps. While taking prescription pain medicines, sleeping medicines, or medicines that make you drowsy. Avoid smoking. Keep all follow-up visits as told by your health care provider. This is important. Contact a health care provider if: You keep feeling nauseous or you keep vomiting. You feel light-headed. You are still sleepy or having trouble with balance after 24 hours. You develop a rash. You have a fever. You have redness or swelling around the IV site. Get help right away if: You have trouble breathing. You have new-onset confusion at home. Summary For several hours after your procedure, you may feel tired. You may also be forgetful and have poor judgment. Have a responsible adult stay with you for the time you are told. It is important to have someone help care for you until you are awake and alert. Rest as told. Do not drive or operate machinery. Do not drink alcohol or take sleeping pills. Get help right away if you have trouble breathing, or if you suddenly become confused. This information is not intended to replace advice given to you  by your health care provider. Make sure you discuss any questions you have with your health care provider. Document Revised: 05/26/2020 Document Reviewed: 08/13/2019 Elsevier Patient Education  2022 [employer].

## 2021-11-27 ENCOUNTER — Telehealth: Payer: Self-pay | Admitting: Gastroenterology

## 2021-11-27 ENCOUNTER — Other Ambulatory Visit: Payer: Self-pay | Admitting: *Deleted

## 2021-11-27 DIAGNOSIS — K7460 Unspecified cirrhosis of liver: Secondary | ICD-10-CM

## 2021-11-27 NOTE — Telephone Encounter (Signed)
Noted  

## 2021-11-27 NOTE — Telephone Encounter (Signed)
Melissa Huynh, can you call patient to remind her to have BMP completed? We needed this updated last week to monitor her kidney function and electrolytes since starting the fluid pills at her office visit.  ?

## 2021-11-27 NOTE — Telephone Encounter (Signed)
Spoke to pt, she informed me that she would have labs drawn tomorrow 11/28/2021 when she goes for her pre-op visit.  ?

## 2021-11-28 ENCOUNTER — Telehealth: Payer: Self-pay | Admitting: Internal Medicine

## 2021-11-28 ENCOUNTER — Other Ambulatory Visit: Payer: Self-pay | Admitting: *Deleted

## 2021-11-28 ENCOUNTER — Other Ambulatory Visit: Payer: Self-pay

## 2021-11-28 ENCOUNTER — Ambulatory Visit (HOSPITAL_COMMUNITY)
Admission: RE | Admit: 2021-11-28 | Discharge: 2021-11-28 | Disposition: A | Discharge status: Home / Self Care | Payer: Medicaid Other | Source: Ambulatory Visit | Attending: Internal Medicine | Admitting: Internal Medicine

## 2021-11-28 ENCOUNTER — Telehealth: Payer: Self-pay | Admitting: *Deleted

## 2021-11-28 ENCOUNTER — Encounter (HOSPITAL_COMMUNITY): Payer: Self-pay

## 2021-11-28 ENCOUNTER — Encounter (HOSPITAL_COMMUNITY)
Admission: RE | Admit: 2021-11-28 | Discharge: 2021-11-28 | Disposition: A | Discharge status: Home / Self Care | Payer: Medicaid Other | Source: Ambulatory Visit | Attending: Internal Medicine | Admitting: Internal Medicine

## 2021-11-28 DIAGNOSIS — K7460 Unspecified cirrhosis of liver: Secondary | ICD-10-CM | POA: Diagnosis present

## 2021-11-28 DIAGNOSIS — R188 Other ascites: Secondary | ICD-10-CM | POA: Diagnosis present

## 2021-11-28 LAB — HEPATIC FUNCTION PANEL
AG Ratio: 1.3 (calc) (ref 1.0–2.5)
ALT: 18 U/L (ref 6–29)
AST: 31 U/L (ref 10–35)
Albumin: 4.1 g/dL (ref 3.6–5.1)
Alkaline phosphatase (APISO): 130 U/L (ref 37–153)
Bilirubin, Direct: 0.3 mg/dL — ABNORMAL HIGH (ref 0.0–0.2)
Globulin: 3.1 g/dL (calc) (ref 1.9–3.7)
Indirect Bilirubin: 0.5 mg/dL (calc) (ref 0.2–1.2)
Total Bilirubin: 0.8 mg/dL (ref 0.2–1.2)
Total Protein: 7.2 g/dL (ref 6.1–8.1)

## 2021-11-28 LAB — BASIC METABOLIC PANEL WITH GFR
BUN: 14 mg/dL (ref 7–25)
CO2: 29 mmol/L (ref 20–32)
Calcium: 9.4 mg/dL (ref 8.6–10.4)
Chloride: 106 mmol/L (ref 98–110)
Creat: 0.57 mg/dL (ref 0.50–1.03)
Glucose, Bld: 101 mg/dL — ABNORMAL HIGH (ref 65–99)
Potassium: 3.6 mmol/L (ref 3.5–5.3)
Sodium: 141 mmol/L (ref 135–146)
eGFR: 105 mL/min/{1.73_m2} (ref >=60)

## 2021-11-28 LAB — GRAM STAIN

## 2021-11-28 LAB — BODY FLUID CELL COUNT WITH DIFFERENTIAL
Lymphs, Fluid: 68 %
Monocyte-Macrophage-Serous Fluid: 30 % — ABNORMAL LOW (ref 50–90)
Neutrophil Count, Fluid: 2 % (ref 0–25)
Total Nucleated Cell Count, Fluid: 181 cu mm (ref 0–1000)

## 2021-11-28 MED ORDER — ALBUMIN HUMAN 25 % IV SOLN: 0.0000 g | Freq: Once | INTRAVENOUS | Status: AC

## 2021-11-28 MED ORDER — ALBUMIN HUMAN 25 % IV SOLN
INTRAVENOUS | Status: AC
  Administered 2021-11-28: 50 g via INTRAVENOUS
  Filled 2021-11-28: qty 200

## 2021-11-28 NOTE — Telephone Encounter (Signed)
Spoke to pt, she is having swelling in her stomach again. Last night she was having pains in her stomach. Wants to know if she should continue with procedure on  11/30/2021. She stated she is at pre-op now.  ?

## 2021-11-28 NOTE — Telephone Encounter (Signed)
Dr. Abbey Chatters spoke with me about this patient today. See separate phone note. He has ordered paracentesis for her today. Keeping plans for procedures for now.  ?

## 2021-11-28 NOTE — Progress Notes (Signed)
PT tolerated left sided paracentesis procedure and 50 G of IV albumin well today. 9 Liters of clear yellow fluid removed with labs collected and sent for processing. PT verbalized understanding of discharge instructions and left via wheelchair at this time with no acute distress noted.  ?

## 2021-11-28 NOTE — Telephone Encounter (Signed)
Noted  

## 2021-11-28 NOTE — Telephone Encounter (Signed)
Per Dr. Abbey Chatters patient needs PARA done today or tomorrow. ?Orders placed.  ? ?Per radiology send patient over now to be worked in. ? ?Called pt and made aware.  ?

## 2021-11-28 NOTE — Procedures (Signed)
PreOperative Dx: Cirrhosis, ascites Postoperative Dx: Cirrhosis, ascites Procedure:   US guided paracentesis Radiologist:  Anber Mckiver Anesthesia:  10 ml of1% lidocaine Specimen:  9 L of yellow ascitic fluid EBL:   < 1 ml Complications: none   

## 2021-11-28 NOTE — Telephone Encounter (Signed)
Pt has procedure scheduled with Dr Marletta Lor on 11/30/2021. She said she had 13 liters of fluid drawn off recently and was starting to swell again with some pain. Please advise. [phone] ?

## 2021-11-29 ENCOUNTER — Other Ambulatory Visit: Payer: Self-pay | Admitting: *Deleted

## 2021-11-29 DIAGNOSIS — K7460 Unspecified cirrhosis of liver: Secondary | ICD-10-CM

## 2021-11-30 ENCOUNTER — Other Ambulatory Visit: Payer: Self-pay

## 2021-11-30 ENCOUNTER — Encounter (HOSPITAL_COMMUNITY)
Admission: RE | Disposition: A | Discharge status: Home / Self Care | Payer: Self-pay | Source: Home / Self Care | Attending: Internal Medicine

## 2021-11-30 ENCOUNTER — Encounter (HOSPITAL_COMMUNITY): Payer: Self-pay

## 2021-11-30 ENCOUNTER — Ambulatory Visit (HOSPITAL_COMMUNITY)
Admission: RE | Admit: 2021-11-30 | Discharge: 2021-11-30 | Disposition: A | Discharge status: Home / Self Care | Payer: Medicaid Other | Attending: Internal Medicine | Admitting: Internal Medicine

## 2021-11-30 ENCOUNTER — Ambulatory Visit (HOSPITAL_BASED_OUTPATIENT_CLINIC_OR_DEPARTMENT_OTHER): Payer: Medicaid Other | Admitting: Anesthesiology

## 2021-11-30 ENCOUNTER — Ambulatory Visit (HOSPITAL_COMMUNITY): Payer: Medicaid Other | Admitting: Anesthesiology

## 2021-11-30 DIAGNOSIS — Z79899 Other long term (current) drug therapy: Secondary | ICD-10-CM | POA: Diagnosis not present

## 2021-11-30 DIAGNOSIS — E119 Type 2 diabetes mellitus without complications: Secondary | ICD-10-CM | POA: Diagnosis not present

## 2021-11-30 DIAGNOSIS — K7460 Unspecified cirrhosis of liver: Secondary | ICD-10-CM | POA: Diagnosis not present

## 2021-11-30 DIAGNOSIS — Z1211 Encounter for screening for malignant neoplasm of colon: Secondary | ICD-10-CM | POA: Insufficient documentation

## 2021-11-30 DIAGNOSIS — K5520 Angiodysplasia of colon without hemorrhage: Secondary | ICD-10-CM | POA: Insufficient documentation

## 2021-11-30 DIAGNOSIS — K766 Portal hypertension: Secondary | ICD-10-CM | POA: Insufficient documentation

## 2021-11-30 DIAGNOSIS — K3189 Other diseases of stomach and duodenum: Secondary | ICD-10-CM

## 2021-11-30 DIAGNOSIS — K648 Other hemorrhoids: Secondary | ICD-10-CM | POA: Diagnosis not present

## 2021-11-30 DIAGNOSIS — F17200 Nicotine dependence, unspecified, uncomplicated: Secondary | ICD-10-CM | POA: Insufficient documentation

## 2021-11-30 DIAGNOSIS — F419 Anxiety disorder, unspecified: Secondary | ICD-10-CM | POA: Diagnosis not present

## 2021-11-30 DIAGNOSIS — I10 Essential (primary) hypertension: Secondary | ICD-10-CM | POA: Diagnosis not present

## 2021-11-30 DIAGNOSIS — R188 Other ascites: Secondary | ICD-10-CM | POA: Diagnosis not present

## 2021-11-30 DIAGNOSIS — Z7984 Long term (current) use of oral hypoglycemic drugs: Secondary | ICD-10-CM | POA: Diagnosis not present

## 2021-11-30 HISTORY — PX: ESOPHAGOGASTRODUODENOSCOPY (EGD) WITH PROPOFOL: SHX5813

## 2021-11-30 HISTORY — PX: COLONOSCOPY WITH PROPOFOL: SHX5780

## 2021-11-30 LAB — GLUCOSE, CAPILLARY: Glucose-Capillary: 145 mg/dL — ABNORMAL HIGH (ref 70–99)

## 2021-11-30 LAB — CYTOLOGY - NON PAP

## 2021-11-30 SURGERY — COLONOSCOPY WITH PROPOFOL
Anesthesia: General

## 2021-11-30 MED ORDER — EPHEDRINE 5 MG/ML INJ
INTRAVENOUS | Status: AC
  Filled 2021-11-30: qty 5

## 2021-11-30 MED ORDER — FUROSEMIDE 40 MG PO TABS: 40.0000 mg | ORAL_TABLET | Freq: Every day | ORAL | 5 refills | Status: AC

## 2021-11-30 MED ORDER — LACTATED RINGERS IV SOLN: INTRAVENOUS | Status: DC

## 2021-11-30 MED ORDER — PROPOFOL 10 MG/ML IV BOLUS
INTRAVENOUS | Status: DC | PRN
  Administered 2021-11-30: 100 mg via INTRAVENOUS

## 2021-11-30 MED ORDER — EPHEDRINE SULFATE-NACL 50-0.9 MG/10ML-% IV SOSY
PREFILLED_SYRINGE | INTRAVENOUS | Status: DC | PRN
  Administered 2021-11-30 (×3): 5 mg via INTRAVENOUS

## 2021-11-30 MED ORDER — PHENYLEPHRINE 40 MCG/ML (10ML) SYRINGE FOR IV PUSH (FOR BLOOD PRESSURE SUPPORT)
PREFILLED_SYRINGE | INTRAVENOUS | Status: DC | PRN
  Administered 2021-11-30 (×5): 80 ug via INTRAVENOUS

## 2021-11-30 MED ORDER — SPIRONOLACTONE 100 MG PO TABS: 100.0000 mg | ORAL_TABLET | Freq: Every day | ORAL | 5 refills | Status: AC

## 2021-11-30 MED ORDER — PHENYLEPHRINE 40 MCG/ML (10ML) SYRINGE FOR IV PUSH (FOR BLOOD PRESSURE SUPPORT)
PREFILLED_SYRINGE | INTRAVENOUS | Status: AC
  Filled 2021-11-30: qty 10

## 2021-11-30 MED ORDER — PROPOFOL 500 MG/50ML IV EMUL
INTRAVENOUS | Status: DC | PRN
Start: 2021-11-30 — End: 2021-11-30
  Administered 2021-11-30: 150 ug/kg/min via INTRAVENOUS

## 2021-11-30 MED ORDER — LIDOCAINE HCL (CARDIAC) PF 100 MG/5ML IV SOSY
PREFILLED_SYRINGE | INTRAVENOUS | Status: DC | PRN
  Administered 2021-11-30: 50 mg via INTRAVENOUS

## 2021-11-30 NOTE — Transfer of Care (Signed)
Immediate Anesthesia Transfer of Care Note ? ?Patient: Melissa Huynh ? ?Procedure(s) Performed: COLONOSCOPY WITH PROPOFOL ?ESOPHAGOGASTRODUODENOSCOPY (EGD) WITH PROPOFOL ? ?Patient Location: Short Stay ? ?Anesthesia Type:General ? ?Level of Consciousness: awake ? ?Airway & Oxygen Therapy: Patient Spontanous Breathing ? ?Post-op Assessment: Report given to RN and Post -op Vital signs reviewed and stable ? ?Post vital signs: Reviewed and stable ? ?Last Vitals:  ?Vitals Value Taken Time  ?BP    ?Temp 36.5 ?C 11/30/21 0916  ?Pulse 84 11/30/21 0916  ?Resp 14 11/30/21 0916  ?SpO2 99 % 11/30/21 0916  ? ? ?Last Pain:  ?Vitals:  ? 11/30/21 0916  ?TempSrc: Oral  ?PainSc: 0-No pain  ?   ? ?  ? ?Complications: No notable events documented. ?

## 2021-11-30 NOTE — Anesthesia Procedure Notes (Signed)
Date/Time: 11/30/2021 8:54 AM ?Performed by: Julian Reil, CRNA ?Pre-anesthesia Checklist: Patient identified, Emergency Drugs available, Suction available and Patient being monitored ?Patient Re-evaluated:Patient Re-evaluated prior to induction ?Oxygen Delivery Method: Nasal cannula ?Induction Type: IV induction ?Placement Confirmation: positive ETCO2 ? ? ? ? ?

## 2021-11-30 NOTE — Anesthesia Preprocedure Evaluation (Signed)
Anesthesia Evaluation  ?Patient identified by MRN, date of birth, ID band ?Patient awake ? ? ? ?Reviewed: ?Allergy & Precautions, H&P , NPO status , Patient's Chart, lab work & pertinent test results, reviewed documented beta blocker date and time  ? ?Airway ?Mallampati: II ? ?TM Distance: >3 FB ?Neck ROM: full ? ? ? Dental ?no notable dental hx. ? ?  ?Pulmonary ?neg pulmonary ROS, Current Smoker,  ?  ?Pulmonary exam normal ?breath sounds clear to auscultation ? ? ? ? ? ? Cardiovascular ?Exercise Tolerance: Good ?hypertension, negative cardio ROS ? ? ?Rhythm:regular Rate:Normal ? ? ?  ?Neuro/Psych ? Headaches, PSYCHIATRIC DISORDERS Anxiety   ? GI/Hepatic ?negative GI ROS, (+) Cirrhosis  ?  ?  ? , Hepatitis -, C  ?Endo/Other  ?negative endocrine ROSdiabetes, Type 2 ? Renal/GU ?negative Renal ROS  ?negative genitourinary ?  ?Musculoskeletal ? ? Abdominal ?  ?Peds ? Hematology ?negative hematology ROS ?(+)   ?Anesthesia Other Findings ? ? Reproductive/Obstetrics ?negative OB ROS ? ?  ? ? ? ? ? ? ? ? ? ? ? ? ? ?  ?  ? ? ? ? ? ? ? ? ?Anesthesia Physical ?Anesthesia Plan ? ?ASA: 3 ? ?Anesthesia Plan: General  ? ?Post-op Pain Management:   ? ?Induction:  ? ?PONV Risk Score and Plan: Propofol infusion ? ?Airway Management Planned:  ? ?Additional Equipment:  ? ?Intra-op Plan:  ? ?Post-operative Plan:  ? ?Informed Consent: I have reviewed the patients History and Physical, chart, labs and discussed the procedure including the risks, benefits and alternatives for the proposed anesthesia with the patient or authorized representative who has indicated his/her understanding and acceptance.  ? ? ? ?Dental Advisory Given ? ?Plan Discussed with: CRNA ? ?Anesthesia Plan Comments:   ? ? ? ? ? ? ?Anesthesia Quick Evaluation ? ?

## 2021-11-30 NOTE — Op Note (Signed)
[facility] ?Patient Name: Melissa Huynh ?Procedure Date: 11/30/2021 8:40 AM ?MRN: [id number] ?Date of Birth: Jul 11, 1961 ?Attending MD: Elon Alas. Abbey Chatters , DO ?CSN: [id number] ?Age: 61 ?Admit Type: Outpatient ?Procedure:                Upper GI endoscopy ?Indications:              Cirrhosis rule out esophageal varices ?Providers:                Elon Alas. Abbey Chatters, DO, Hughie Closs RN, RN, Tammy  ?                          Vaught, RN, Minette Headland L. Risa Grill, Technician ?Referring MD:              ?Medicines:                See the Anesthesia note for documentation of the  ?                          administered medications ?Complications:            No immediate complications. ?Estimated Blood Loss:     Estimated blood loss: none. ?Procedure:                Pre-Anesthesia Assessment: ?                          - The anesthesia plan was to use monitored  ?                          anesthesia care (MAC). ?                          After obtaining informed consent, the endoscope was  ?                          passed under direct vision. Throughout the  ?                          procedure, the patient's blood pressure, pulse, and  ?                          oxygen saturations were monitored continuously. The  ?                          [id number]-H190 ([id number]) scope was introduced through the  ?                          mouth, and advanced to the second part of duodenum.  ?                          The upper GI endoscopy was accomplished without  ?                          difficulty. The patient tolerated the procedure  ?  well. ?Scope In: 8:53:10 AM ?Scope Out: 8:56:19 AM ?Total Procedure Duration: 0 hours 3 minutes 9 seconds  ?Findings: ?     There is no endoscopic evidence of varices in the entire esophagus. ?     Mild portal hypertensive gastropathy was found in the gastric body. ?     The duodenal bulb, first portion of the duodenum and second portion of  ?     the duodenum were  normal. ?Impression:               - Portal hypertensive gastropathy. ?                          - Normal duodenal bulb, first portion of the  ?                          duodenum and second portion of the duodenum. ?                          - No specimens collected. ?Moderate Sedation: ?     Per Anesthesia Care ?Recommendation:           - Patient has a contact number available for  ?                          emergencies. The signs and symptoms of potential  ?                          delayed complications were discussed with the  ?                          patient. Return to normal activities tomorrow.  ?                          Written discharge instructions were provided to the  ?                          patient. ?                          - Resume previous diet. ?                          - Continue present medications. ?                          - Repeat upper endoscopy in 2 years for screening  ?                          purposes. ?                          - Return to GI clinic in 3 months. ?Procedure Code(s):        --- Professional --- ?                          [phone], Esophagogastroduodenoscopy, flexible,  ?  transoral; diagnostic, including collection of  ?                          specimen(s) by brushing or washing, when performed  ?                          (separate procedure) ?Diagnosis Code(s):        --- Professional --- ?                          K76.6, Portal hypertension ?                          K31.89, Other diseases of stomach and duodenum ?                          K74.60, Unspecified cirrhosis of liver ?CPT copyright 2019 American [employer] Association. All rights reserved. ?The codes documented in this report are preliminary and upon coder review may  ?be revised to meet current compliance requirements. ?Elon Alas. Abbey Chatters, DO ?Elon Alas. [place], DO ?11/30/2021 8:59:15 AM ?This report has been signed electronically. ?Number of Addenda: 0 ?

## 2021-11-30 NOTE — Discharge Instructions (Addendum)
EGD ?Discharge instructions ?Please read the instructions outlined below and refer to this sheet in the next few weeks. These discharge instructions provide you with general information on caring for yourself after you leave the hospital. Your doctor may also give you specific instructions. While your treatment has been planned according to the most current medical practices available, unavoidable complications occasionally occur. If you have any problems or questions after discharge, please call your doctor. ?ACTIVITY ?You may resume your regular activity but move at a slower pace for the next 24 hours.  ?Take frequent rest periods for the next 24 hours.  ?Walking will help expel (get rid of) the air and reduce the bloated feeling in your abdomen.  ?No driving for 24 hours (because of the anesthesia (medicine) used during the test).  ?You may shower.  ?Do not sign any important legal documents or operate any machinery for 24 hours (because of the anesthesia used during the test).  ?NUTRITION ?Drink plenty of fluids.  ?You may resume your normal diet.  ?Begin with a light meal and progress to your normal diet.  ?Avoid alcoholic beverages for 24 hours or as instructed by your caregiver.  ?MEDICATIONS ?You may resume your normal medications unless your caregiver tells you otherwise.  ?WHAT YOU CAN EXPECT TODAY ?You may experience abdominal discomfort such as a feeling of fullness or ?gas? pains.  ?FOLLOW-UP ?Your doctor will discuss the results of your test with you.  ?SEEK IMMEDIATE MEDICAL ATTENTION IF ANY OF THE FOLLOWING OCCUR: ?Excessive nausea (feeling sick to your stomach) and/or vomiting.  ?Severe abdominal pain and distention (swelling).  ?Trouble swallowing.  ?Temperature over 101? F (37.8? C).  ?Rectal bleeding or vomiting of blood.  ? ? ? ?Colonoscopy ?Discharge Instructions ? ?Read the instructions outlined below and refer to this sheet in the next few weeks. These discharge instructions provide you with  general information on caring for yourself after you leave the hospital. Your doctor may also give you specific instructions. While your treatment has been planned according to the most current medical practices available, unavoidable complications occasionally occur.  ? ?ACTIVITY ?You may resume your regular activity, but move at a slower pace for the next 24 hours.  ?Take frequent rest periods for the next 24 hours.  ?Walking will help get rid of the air and reduce the bloated feeling in your belly (abdomen).  ?No driving for 24 hours (because of the medicine (anesthesia) used during the test).   ?Do not sign any important legal documents or operate any machinery for 24 hours (because of the anesthesia used during the test).  ?NUTRITION ?Drink plenty of fluids.  ?You may resume your normal diet as instructed by your doctor.  ?Begin with a light meal and progress to your normal diet. Heavy or fried foods are harder to digest and may make you feel sick to your stomach (nauseated).  ?Avoid alcoholic beverages for 24 hours or as instructed.  ?MEDICATIONS ?You may resume your normal medications unless your doctor tells you otherwise.  ?WHAT YOU CAN EXPECT TODAY ?Some feelings of bloating in the abdomen.  ?Passage of more gas than usual.  ?Spotting of blood in your stool or on the toilet paper.  ?IF YOU HAD POLYPS REMOVED DURING THE COLONOSCOPY: ?No aspirin products for 7 days or as instructed.  ?No alcohol for 7 days or as instructed.  ?Eat a soft diet for the next 24 hours.  ?FINDING OUT THE RESULTS OF YOUR TEST ?Not all test results are  available during your visit. If your test results are not back during the visit, make an appointment with your caregiver to find out the results. Do not assume everything is normal if you have not heard from your caregiver or the medical facility. It is important for you to follow up on all of your test results.  ?SEEK IMMEDIATE MEDICAL ATTENTION IF: ?You have more than a spotting of  blood in your stool.  ?Your belly is swollen (abdominal distention).  ?You are nauseated or vomiting.  ?You have a temperature over 101.  ?You have abdominal pain or discomfort that is severe or gets worse throughout the day.  ? ?Your EGD was relatively unremarkable.  I did not see any evidence of esophageal varices.  We will repeat this in 2 years.  Your colonoscopy also looks good.  I did not find a polyps or evidence of colon cancer.  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  You have not had signs of anemia so that these did not need to be treated today. ? ?I sent in new prescriptions for your Lasix 40 mg and Aldactone 100 mg to your pharmacy.  Follow-up with GI as previously scheduled. ? ?I hope you have a great rest of your week! ? ?Hennie Duos. Marletta Lor, D.O. ?Gastroenterology and Hepatology ?[facility] ? ?

## 2021-11-30 NOTE — Interval H&P Note (Signed)
History and Physical Interval Note: ? ?11/30/2021 ?8:41 AM ? ?Melissa Huynh  has presented today for surgery, with the diagnosis of cirrhosis, hx hep c, colon cancer screening.  The various methods of treatment have been discussed with the patient and family. After consideration of risks, benefits and other options for treatment, the patient has consented to  Procedure(s) with comments: ?COLONOSCOPY WITH PROPOFOL (N/A) - 9:15am ?ESOPHAGOGASTRODUODENOSCOPY (EGD) WITH PROPOFOL (N/A) as a surgical intervention.  The patient's history has been reviewed, patient examined, no change in status, stable for surgery.  I have reviewed the patient's chart and labs.  Questions were answered to the patient's satisfaction.   ? ? ?[name] ? ? ?

## 2021-11-30 NOTE — Anesthesia Postprocedure Evaluation (Signed)
Anesthesia Post Note ? ?Patient: Melissa Huynh ? ?Procedure(s) Performed: COLONOSCOPY WITH PROPOFOL ?ESOPHAGOGASTRODUODENOSCOPY (EGD) WITH PROPOFOL ? ?Patient location during evaluation: Phase II ?Anesthesia Type: General ?Level of consciousness: awake ?Pain management: pain level controlled ?Vital Signs Assessment: post-procedure vital signs reviewed and stable ?Respiratory status: spontaneous breathing and respiratory function stable ?Cardiovascular status: blood pressure returned to baseline and stable ?Postop Assessment: no headache and no apparent nausea or vomiting ?Anesthetic complications: no ?Comments: Late entry ? ? ?No notable events documented. ? ? ?Last Vitals:  ?Vitals:  ? 11/30/21 0916 11/30/21 0921  ?BP:  (!) 101/55  ?Pulse: 84   ?Resp: 14   ?Temp: 36.5 ?C   ?SpO2: 99%   ?  ?Last Pain:  ?Vitals:  ? 11/30/21 0916  ?TempSrc: Oral  ?PainSc: 0-No pain  ? ? ?  ?  ?  ?  ?  ?  ? ?Louann Sjogren ? ? ? ? ?

## 2021-11-30 NOTE — Op Note (Signed)
[facility] ?Patient Name: Melissa Huynh ?Procedure Date: 11/30/2021 8:58 AM ?MRN: [id number] ?Date of Birth: 1961-02-12 ?Attending MD: Elon Alas. Abbey Chatters , DO ?CSN: [id number] ?Age: 61 ?Admit Type: Outpatient ?Procedure:                Colonoscopy ?Indications:              Screening for colorectal malignant neoplasm ?Providers:                Elon Alas. Abbey Chatters, DO, Hughie Closs RN, RN, Tammy  ?                          Vaught, RN, [employer], Technician ?Referring MD:              ?Medicines:                See the Anesthesia note for documentation of the  ?                          administered medications ?Complications:            No immediate complications. ?Estimated Blood Loss:     Estimated blood loss: none. ?Procedure:                Pre-Anesthesia Assessment: ?                          - The anesthesia plan was to use monitored  ?                          anesthesia care (MAC). ?                          After obtaining informed consent, the colonoscope  ?                          was passed under direct vision. Throughout the  ?                          procedure, the patient's blood pressure, pulse, and  ?                          oxygen saturations were monitored continuously. The  ?                          PCF-HQ190L ([id number]) scope was introduced through  ?                          the anus and advanced to the the cecum, identified  ?                          by appendiceal orifice and ileocecal valve. The  ?                          colonoscopy was performed without difficulty. The  ?                          patient tolerated  the procedure well. The quality  ?                          of the bowel preparation was evaluated using the  ?                          BBPS [facility] Bowel Preparation Scale) with scores  ?                          of: Right Colon = 3, Transverse Colon = 3 and Left  ?                          Colon = 3 (entire mucosa seen well with no residual  ?                           staining, small fragments of stool or opaque  ?                          liquid). The total BBPS score equals 9. ?Scope In: 9:00:11 AM ?Scope Out: 9:12:19 AM ?Scope Withdrawal Time: 0 hours 8 minutes 7 seconds  ?Total Procedure Duration: 0 hours 12 minutes 8 seconds  ?Findings: ?     The perianal and digital rectal examinations were normal. ?     Non-bleeding internal hemorrhoids were found during endoscopy. ?     Two medium-sized angiodysplastic lesions without bleeding were found in  ?     the cecum. ?     Multiple small angiodysplastic lesions without bleeding were found in  ?     the transverse colon and in the ascending colon. ?Impression:               - Non-bleeding internal hemorrhoids. ?                          - Two non-bleeding colonic angiodysplastic lesions. ?                          - Multiple non-bleeding colonic angiodysplastic  ?                          lesions. ?                          - No specimens collected. ?Moderate Sedation: ?     Per Anesthesia Care ?Recommendation:           - Patient has a contact number available for  ?                          emergencies. The signs and symptoms of potential  ?                          delayed complications were discussed with the  ?                          patient. Return to normal activities tomorrow.  ?  Written discharge instructions were provided to the  ?                          patient. ?                          - Resume previous diet. ?                          - Continue present medications. ?                          - Repeat colonoscopy in 10 years for screening  ?                          purposes. ?                          - Return to GI clinic in 3 months. ?Procedure Code(s):        --- Professional --- ?                          M7680, Colorectal cancer screening; colonoscopy on  ?                          individual not meeting criteria for high risk ?Diagnosis Code(s):        --- Professional --- ?                           Z12.11, Encounter for screening for malignant  ?                          neoplasm of colon ?                          K55.20, Angiodysplasia of colon without hemorrhage ?                          K64.8, Other hemorrhoids ?CPT copyright 2019 American Medical Association. All rights reserved. ?The codes documented in this report are preliminary and upon coder review may  ?be revised to meet current compliance requirements. ?Elon Alas. Abbey Chatters, DO ?Elon Alas. [place], DO ?11/30/2021 9:16:49 AM ?This report has been signed electronically. ?Number of Addenda: 0 ?

## 2021-12-03 LAB — CULTURE, BODY FLUID W GRAM STAIN -BOTTLE
Culture: NO GROWTH
Special Requests: ADEQUATE

## 2021-12-05 ENCOUNTER — Encounter (HOSPITAL_COMMUNITY): Payer: Self-pay | Admitting: Internal Medicine

## 2021-12-05 ENCOUNTER — Other Ambulatory Visit: Payer: Self-pay

## 2021-12-05 ENCOUNTER — Ambulatory Visit (HOSPITAL_COMMUNITY)
Admission: RE | Admit: 2021-12-05 | Discharge: 2021-12-05 | Disposition: A | Discharge status: Home / Self Care | Payer: Medicaid Other | Source: Ambulatory Visit | Attending: Gastroenterology | Admitting: Gastroenterology

## 2021-12-05 DIAGNOSIS — R188 Other ascites: Secondary | ICD-10-CM | POA: Insufficient documentation

## 2021-12-05 DIAGNOSIS — K7460 Unspecified cirrhosis of liver: Secondary | ICD-10-CM | POA: Insufficient documentation

## 2021-12-05 DIAGNOSIS — R772 Abnormality of alphafetoprotein: Secondary | ICD-10-CM | POA: Diagnosis present

## 2021-12-05 LAB — HEPATITIS B SURFACE ANTIGEN: Hepatitis B Surface Ag: NONREACTIVE

## 2021-12-05 LAB — IGG, IGA, IGM
IgG (Immunoglobin G), Serum: 2307 mg/dL — ABNORMAL HIGH (ref 600–1640)
IgM, Serum: 191 mg/dL (ref 50–300)
Immunoglobulin A: 397 mg/dL — ABNORMAL HIGH (ref 47–310)

## 2021-12-05 LAB — AFP TUMOR MARKER: AFP-Tumor Marker: 15.5 ng/mL — ABNORMAL HIGH

## 2021-12-05 LAB — ALPHA-1 ANTITRYPSIN PHENOTYPE: A-1 Antitrypsin, Ser: 247 mg/dL — ABNORMAL HIGH (ref 83–199)

## 2021-12-05 LAB — ANTI-NUCLEAR AB-TITER (ANA TITER): ANA Titer 1: 1:40 {titer} — ABNORMAL HIGH

## 2021-12-05 LAB — ANA: Anti Nuclear Antibody (ANA): POSITIVE — AB

## 2021-12-05 LAB — MITOCHONDRIAL ANTIBODIES: Mitochondrial M2 Ab, IgG: <=20 U (ref <=20.0)

## 2021-12-05 LAB — HEPATITIS A ANTIBODY, TOTAL: Hepatitis A AB,Total: REACTIVE — AB

## 2021-12-05 LAB — HEPATITIS B CORE ANTIBODY, TOTAL: Hep B Core Total Ab: NONREACTIVE

## 2021-12-05 LAB — HEPATITIS B SURFACE ANTIBODY,QUALITATIVE: Hep B S Ab: NONREACTIVE

## 2021-12-05 LAB — ANTI-SMOOTH MUSCLE ANTIBODY, IGG: Actin (Smooth Muscle) Antibody (IGG): <20 U (ref <20)

## 2021-12-05 LAB — CERULOPLASMIN: Ceruloplasmin: 30 mg/dL (ref 18–53)

## 2021-12-05 MED ORDER — GADOBUTROL 1 MMOL/ML IV SOLN
10.0000 mL | Freq: Once | INTRAVENOUS | Status: AC | PRN
  Administered 2021-12-05: 10 mL via INTRAVENOUS

## 2021-12-06 ENCOUNTER — Other Ambulatory Visit: Payer: Self-pay | Admitting: *Deleted

## 2021-12-06 ENCOUNTER — Telehealth: Payer: Self-pay | Admitting: *Deleted

## 2021-12-06 ENCOUNTER — Telehealth: Payer: Self-pay | Admitting: Gastroenterology

## 2021-12-06 DIAGNOSIS — R188 Other ascites: Secondary | ICD-10-CM

## 2021-12-06 NOTE — Telephone Encounter (Signed)
Spoke to [employer] at [facility] and she stated that she could add a BMP to the labs that were drawn yesterday and it would be a 24 to 48 hour turn around.  ?

## 2021-12-06 NOTE — Telephone Encounter (Signed)
Noted  

## 2021-12-06 NOTE — Telephone Encounter (Signed)
Spoke to pt, she informed me that she had labs drawn at [place]. She also asked about her Lasix 40mg  and Aldactone 100mg . She wants to know if she should still be taking both. She states her BP was low last week when she took it 99/68. I informed her that I would ask the provider.  ?

## 2021-12-06 NOTE — Telephone Encounter (Signed)
That is ok. Yes, we will continue her current dose of diuretics. If top number drops to 90 or below, please let me know.  ?

## 2021-12-06 NOTE — Telephone Encounter (Signed)
Patient called needing to speak to a nurse, please call when you are back from lunch  ?

## 2021-12-07 ENCOUNTER — Other Ambulatory Visit: Payer: Self-pay | Admitting: Gastroenterology

## 2021-12-07 ENCOUNTER — Ambulatory Visit: Payer: Medicaid Other | Admitting: Gastroenterology

## 2021-12-07 LAB — HEPATIC FUNCTION PANEL
AG Ratio: 0.9 (calc) — ABNORMAL LOW (ref 1.0–2.5)
ALT: 21 U/L (ref 6–29)
AST: 36 U/L — ABNORMAL HIGH (ref 10–35)
Albumin: 3.4 g/dL — ABNORMAL LOW (ref 3.6–5.1)
Alkaline phosphatase (APISO): 152 U/L (ref 37–153)
Bilirubin, Direct: 0.3 mg/dL — ABNORMAL HIGH (ref 0.0–0.2)
Globulin: 3.7 g/dL (calc) (ref 1.9–3.7)
Indirect Bilirubin: 0.6 mg/dL (calc) (ref 0.2–1.2)
Total Bilirubin: 0.9 mg/dL (ref 0.2–1.2)
Total Protein: 7.1 g/dL (ref 6.1–8.1)

## 2021-12-07 LAB — BASIC METABOLIC PANEL WITH GFR
BUN: 21 mg/dL (ref 7–25)
CO2: 26 mmol/L (ref 20–32)
Calcium: 9.1 mg/dL (ref 8.6–10.4)
Chloride: 102 mmol/L (ref 98–110)
Creat: 0.81 mg/dL (ref 0.50–1.03)
Glucose, Bld: 92 mg/dL (ref 65–99)
Potassium: 3.2 mmol/L — ABNORMAL LOW (ref 3.5–5.3)
Sodium: 143 mmol/L (ref 135–146)
eGFR: 84 mL/min/{1.73_m2} (ref >=60)

## 2021-12-07 LAB — TEST AUTHORIZATION

## 2021-12-07 NOTE — Telephone Encounter (Signed)
Spoke to pt, informed her to continue taking Lasix and Aldactone. Pt voiced understanding. Pt is inquiring about labs for next week. States she thinks she is supposed to have labs drawn every week for a while. ?

## 2021-12-07 NOTE — Telephone Encounter (Signed)
I will have further recommendations on this once I review her blood work completed this week.  ?

## 2021-12-07 NOTE — Telephone Encounter (Signed)
Noted  

## 2021-12-11 ENCOUNTER — Other Ambulatory Visit: Payer: Self-pay | Admitting: *Deleted

## 2021-12-11 ENCOUNTER — Telehealth: Payer: Self-pay | Admitting: Gastroenterology

## 2021-12-11 DIAGNOSIS — K7460 Unspecified cirrhosis of liver: Secondary | ICD-10-CM

## 2021-12-11 NOTE — Telephone Encounter (Signed)
Patient aware of PARA APPT. She will have lab work done same day. ? ?She wants to speak with Baxter Hire regarding her MRI results also. Please advise Baxter Hire thanks ?

## 2021-12-11 NOTE — Telephone Encounter (Signed)
We had previously discussed MRI results.  Spoke with patient again today.  She was wondering if MRI could tell how severe her liver damage was.  Again explained cirrhosis as permanent scarring of the liver.  We discussed her MELD and child Pugh score as well.  ?

## 2021-12-11 NOTE — Telephone Encounter (Signed)
Patient called and said that she is swelling again and needs fluid taken off  ?

## 2021-12-11 NOTE — Telephone Encounter (Signed)
We don't have standing orders for pt. Fowarding to [place] ?

## 2021-12-11 NOTE — Telephone Encounter (Signed)
Okay to arrange paracentesis. ?Max 8 L. ?Albumin per protocol. ?Labs: Body fluid cell count, culture, Gram stain ? ?Please also remind her to have repeat blood work completed this week. ?

## 2021-12-13 ENCOUNTER — Encounter (HOSPITAL_COMMUNITY): Payer: Self-pay

## 2021-12-13 ENCOUNTER — Other Ambulatory Visit: Payer: Self-pay

## 2021-12-13 ENCOUNTER — Ambulatory Visit (HOSPITAL_COMMUNITY)
Admission: RE | Admit: 2021-12-13 | Discharge: 2021-12-13 | Disposition: A | Discharge status: Home / Self Care | Payer: Medicaid Other | Source: Ambulatory Visit | Attending: Gastroenterology | Admitting: Gastroenterology

## 2021-12-13 DIAGNOSIS — R188 Other ascites: Secondary | ICD-10-CM | POA: Insufficient documentation

## 2021-12-13 DIAGNOSIS — K7460 Unspecified cirrhosis of liver: Secondary | ICD-10-CM | POA: Insufficient documentation

## 2021-12-13 LAB — GRAM STAIN: Gram Stain: NONE SEEN

## 2021-12-13 LAB — BODY FLUID CELL COUNT WITH DIFFERENTIAL
Eos, Fluid: 0 %
Lymphs, Fluid: 35 %
Monocyte-Macrophage-Serous Fluid: 59 % (ref 50–90)
Neutrophil Count, Fluid: 6 % (ref 0–25)
Total Nucleated Cell Count, Fluid: 156 cu mm (ref 0–1000)

## 2021-12-13 MED ORDER — SODIUM CHLORIDE FLUSH 0.9 % IV SOLN
INTRAVENOUS | Status: AC
  Administered 2021-12-13: 10 mL via INTRAVENOUS
  Filled 2021-12-13: qty 10

## 2021-12-13 MED ORDER — ALBUMIN HUMAN 25 % IV SOLN
INTRAVENOUS | Status: AC
  Administered 2021-12-13: 50 g via INTRAVENOUS
  Filled 2021-12-13: qty 200

## 2021-12-13 NOTE — Procedures (Addendum)
?   Cirrhosis/ previous Hep C;  Ascites ? ?PROCEDURE SUMMARY: ? ?Successful ultrasound guided paracentesis from the  Left lower quadrant.  ?Yielded 8 liters of yellow fluid.  ?Maximum per ordering MD Dr Gilford Raid ?No immediate complications.  ?The patient tolerated the procedure well.  ?50 G of IV albumin during procedure - per MD order ? ?Specimen was  sent for labs. ? ?EBL < 52mL ? ?The patient has required >/=2 paracenteses in a 30 day period and a formal evaluation by the [facility] has been arranged. ? ?Robet Leu PAC ? ?Marliss Coots, MD ?Pager: [phone] ?  ?

## 2021-12-13 NOTE — Progress Notes (Signed)
? ?  Portal Hypertension Clinic Screening Evaluation ? ? ?Indication for evaluation: ?Melissa Huynh is a 61 y.o. female undergoing preliminary evaluation in the [facility] Interventional Radiology Portal Hypertension Clinic due to recurrent ascites. ? ?Referring Physician/Established Gastroenterologist:  Ermalinda Memos, PA-C, Earnest Bailey, DO [facility]) ? ?Etiology of cirrhosis: Hepatitis C cirrhosis ?Initially diagnosed: January 2023 ?# of paracentesis in last month: 2 ?# of paracentesis in last 2 months: 4 ?History of hepatic hydrothorax:  No ?History of hepatic encephalopathy: None described ? ?Prior evaluation for liver transplant: No ?History of hepatocellular carcinoma: No ? ?Prior esophagogastroduodenoscopy/intervention: 11/30/21 (Dr. Marletta Lor) ?Current esophageal varices: None visualized ?Current gastric varices: None visualized ?History of hematemesis: No ? ?Current diuretic regimen: furosemide 40 mg QD, spironolactone 100 mg QD ?Current pharmacologic encephalopathy prophylaxis/treatment: none ? ?History of renal dysfunction: No ?History of hemodialysis: No ? ?History of cardiac dysfunction: No ? ?Other pertinent past medical history: Diabetes mellitus, hypercholesterolemia, hypertension ? ? ?Imaging: ?Prior cross sectional imaging of portal system: ?MRI Abdomen 12/05/21 ? ? ?Favorable right hepatic vein to right posterior portal vein window. ? ?Echocardiogram: None on record ? ? ?Labs: ?Creatinine: 0.81 ?Total Bilirubin: 0.6 ?INR: 1.3 ?Sodium: 143 ?Albumin: 2.7 ? ?Child-Pugh = 9 points, class B ?MELD = 9 (1.9% estimated 3 month mortality) ?[place] Index of Post-TIPS Survival (FIPS) = 1 month 97.2%, 3 months 91.6%, 6 months 88.0% ? ? ? ?Assessment: ?Melissa Huynh is a 61 y.o. female with history of hepatitis C cirrhosis and recurrent ascites (Child Pugh B, MELD 9).  After preliminary evaluation, this patient would be a candidate for TIPS creation. ? ?Recommendation: ?Formal  consult for consideration of TIPS could be considered.  The patient's Gastroenterologist, Dr. Marletta Lor, will be contacted for further discussion. ? ? ? ?Electronically Signed: ?Bennie Dallas, MD ?12/13/2021, 2:39 PM ? ? ? ?

## 2021-12-13 NOTE — Progress Notes (Signed)
PT tolerated left sided paracentesis and 50 G of IV albumin well today and 8 Liters of clear yellow fluid removed with labs collected and sent for processing. Pt verbalized understanding of discharge instructions and left via wheelchair with no acute distress noted.  ?

## 2021-12-14 ENCOUNTER — Other Ambulatory Visit: Payer: Self-pay | Admitting: *Deleted

## 2021-12-14 DIAGNOSIS — R772 Abnormality of alphafetoprotein: Secondary | ICD-10-CM

## 2021-12-14 DIAGNOSIS — K7460 Unspecified cirrhosis of liver: Secondary | ICD-10-CM

## 2021-12-14 LAB — BASIC METABOLIC PANEL
BUN: 13 mg/dL (ref 7–25)
CO2: 27 mmol/L (ref 20–32)
Calcium: 8.8 mg/dL (ref 8.6–10.4)
Chloride: 104 mmol/L (ref 98–110)
Creat: 0.54 mg/dL (ref 0.50–1.03)
Glucose, Bld: 105 mg/dL — ABNORMAL HIGH (ref 65–99)
Potassium: 3.6 mmol/L (ref 3.5–5.3)
Sodium: 140 mmol/L (ref 135–146)

## 2021-12-14 LAB — PATHOLOGIST SMEAR REVIEW

## 2021-12-18 ENCOUNTER — Other Ambulatory Visit: Payer: Self-pay | Admitting: *Deleted

## 2021-12-18 DIAGNOSIS — R772 Abnormality of alphafetoprotein: Secondary | ICD-10-CM

## 2021-12-18 DIAGNOSIS — R188 Other ascites: Secondary | ICD-10-CM

## 2021-12-18 LAB — CULTURE, BODY FLUID W GRAM STAIN -BOTTLE: Culture: NO GROWTH

## 2021-12-21 LAB — BASIC METABOLIC PANEL
BUN: 15 mg/dL (ref 7–25)
CO2: 26 mmol/L (ref 20–32)
Calcium: 9.2 mg/dL (ref 8.6–10.4)
Chloride: 103 mmol/L (ref 98–110)
Creat: 0.58 mg/dL (ref 0.50–1.03)
Glucose, Bld: 133 mg/dL — ABNORMAL HIGH (ref 65–99)
Potassium: 3.8 mmol/L (ref 3.5–5.3)
Sodium: 138 mmol/L (ref 135–146)

## 2021-12-28 ENCOUNTER — Ambulatory Visit: Payer: Medicaid Other | Admitting: Internal Medicine

## 2021-12-28 ENCOUNTER — Telehealth: Payer: Self-pay | Admitting: *Deleted

## 2021-12-28 ENCOUNTER — Other Ambulatory Visit: Payer: Self-pay

## 2021-12-28 DIAGNOSIS — R188 Other ascites: Secondary | ICD-10-CM

## 2021-12-28 NOTE — Telephone Encounter (Signed)
Noted.  We will arrange arrange for her to have a paracentesis.  Due to her requiring frequent paracentesis, we need to refer her for evaluation of TIPS as we previously discussed.  ? ?[facility] Clinical Pool:  ?Please arrange paracentesis ASAP.  ?Max 8L ?Albumin per protocol ?Labs: Body fluid count and culture, Gram stain ? ?Please also place referral to [facility] interventional radiology portal hypertension clinic for TIPS evaluation. Dx; Cirrhosis, recurrent ascites  ?

## 2021-12-28 NOTE — Telephone Encounter (Signed)
Noted  

## 2021-12-28 NOTE — Telephone Encounter (Signed)
Para scheduled for 12/29/21 at 10:30am, arrive at 10:15am. IR referral sent via Epic. ? ?Called pt, informed her of para appt and IR referral. ?

## 2021-12-28 NOTE — Addendum Note (Signed)
Addended by: Corrie Mckusick on: 12/28/2021 03:25 PM ? ? Modules accepted: Orders ? ?

## 2021-12-28 NOTE — Telephone Encounter (Signed)
Spoke to pt, she informed me that it looks like she has about 4 to 5 liters of fluid in her abdomen and it is hard to the touch.  ?

## 2021-12-28 NOTE — Telephone Encounter (Signed)
Spoke to pt, she informed me that she missed her appointment today. She states her abdomen is swelling. Her legs are not swollen. She wants advise as to what to do? ?

## 2021-12-28 NOTE — Telephone Encounter (Signed)
She really need to be seen in the office ASAP. It is unfortunate that she missed her appointment today. Does she feel like she needs another paracentesis?  ?

## 2021-12-29 ENCOUNTER — Ambulatory Visit (HOSPITAL_COMMUNITY)
Admission: RE | Admit: 2021-12-29 | Discharge: 2021-12-29 | Disposition: A | Discharge status: Home / Self Care | Payer: Medicaid Other | Source: Ambulatory Visit | Attending: Gastroenterology | Admitting: Gastroenterology

## 2021-12-29 ENCOUNTER — Encounter (HOSPITAL_COMMUNITY): Payer: Self-pay

## 2021-12-29 DIAGNOSIS — R188 Other ascites: Secondary | ICD-10-CM | POA: Insufficient documentation

## 2021-12-29 DIAGNOSIS — K7460 Unspecified cirrhosis of liver: Secondary | ICD-10-CM | POA: Diagnosis not present

## 2021-12-29 LAB — BODY FLUID CELL COUNT WITH DIFFERENTIAL
Eos, Fluid: 0 %
Lymphs, Fluid: 65 %
Monocyte-Macrophage-Serous Fluid: 31 % — ABNORMAL LOW (ref 50–90)
Neutrophil Count, Fluid: 4 % (ref 0–25)
Total Nucleated Cell Count, Fluid: 150 cu mm (ref 0–1000)

## 2021-12-29 LAB — GRAM STAIN

## 2021-12-29 MED ORDER — ALBUMIN HUMAN 25 % IV SOLN
INTRAVENOUS | Status: AC
  Administered 2021-12-29: 50 g via INTRAVENOUS
  Filled 2021-12-29: qty 200

## 2021-12-29 MED ORDER — SODIUM CHLORIDE FLUSH 0.9 % IV SOLN
INTRAVENOUS | Status: AC
  Administered 2021-12-29: 10 mL
  Filled 2021-12-29: qty 10

## 2021-12-29 MED ORDER — ALBUMIN HUMAN 25 % IV SOLN
0.0000 g | Freq: Once | INTRAVENOUS | Status: AC
  Filled 2021-12-29: qty 400

## 2021-12-29 NOTE — Progress Notes (Signed)
PT tolerated left side paracentesis procedure and 50G of IV albumin well today and 8 Liters of clear yellow fluid removed with labs collected and sent for processing. PT verbalized understanding of discharge instructions and left via wheelchair with husband with no acute distress noted.  ?

## 2021-12-29 NOTE — Procedures (Signed)
PreOperative Dx: Cirrhosis, ascites ?Postoperative Dx: Cirrhosis, ascites ?Procedure:   US guided paracentesis ?Radiologist:  Tyron Russell ?Anesthesia:  10 ml of1% lidocaine ?Specimen:  8 L of clear yellow ascitic fluid ?EBL:   < 1 ml ?Complications: None   ?

## 2022-01-02 LAB — PATHOLOGIST SMEAR REVIEW

## 2022-01-03 ENCOUNTER — Ambulatory Visit: Payer: Medicaid Other | Admitting: Gastroenterology

## 2022-01-03 LAB — CULTURE, BODY FLUID W GRAM STAIN -BOTTLE: Culture: NO GROWTH

## 2022-01-09 ENCOUNTER — Telehealth: Payer: Self-pay | Admitting: *Deleted

## 2022-01-09 NOTE — Telephone Encounter (Signed)
Spoke to pt, she informed me that she feels like she needs a paracentesis. She states that her belly is getting bigger and it hurts. She has not been eating much, but trying to stay hydrated. Would like to know what to do?  ?

## 2022-01-09 NOTE — Telephone Encounter (Signed)
We will arrange for her to have repeat paracentesis.  Has she heard from IR regarding her referral for TIPS evaluation? ? ?RGA clinical pool:  ?Please arrange paracentesis. ?Max 8 L. ?Albumin per protocol. ?Labs: Body fluid cell count culture, Gram stain ? ?Patient should hold diuretics the day of her paracentesis. ?

## 2022-01-10 ENCOUNTER — Ambulatory Visit
Admission: RE | Admit: 2022-01-10 | Discharge: 2022-01-10 | Disposition: A | Discharge status: Home / Self Care | Payer: Medicaid Other | Source: Ambulatory Visit | Attending: Gastroenterology | Admitting: Gastroenterology

## 2022-01-10 ENCOUNTER — Other Ambulatory Visit: Payer: Self-pay | Admitting: *Deleted

## 2022-01-10 ENCOUNTER — Encounter: Payer: Self-pay | Admitting: *Deleted

## 2022-01-10 DIAGNOSIS — K7460 Unspecified cirrhosis of liver: Secondary | ICD-10-CM

## 2022-01-10 HISTORY — PX: IR RADIOLOGIST EVAL & MGMT: IMG5224

## 2022-01-10 NOTE — Addendum Note (Signed)
Addended by: Armstead Peaks on: 01/10/2022 08:01 AM ? ? Modules accepted: Orders ? ?

## 2022-01-10 NOTE — Telephone Encounter (Signed)
Called pt, went straight to VM.  ?PARA scheduled for 4/21 Friday, arrival 9:30am ? ?Called vicki with [facility]. Original order placed for [facility]. Order changed to [place]. She will call pt to get scheduled ?

## 2022-01-10 NOTE — Telephone Encounter (Signed)
Called pt, went straight to VM ? ?PARA scheduled for 4/21, Arrival 9:30am ? ?Called Vicky at [place], order was placed for [facility]. New order placed for [facility]. She will call and get patient scheduled.  ?

## 2022-01-10 NOTE — Telephone Encounter (Signed)
Patient aware of PARA appt. She already had a VM from [facility]. Pt aware not take diuretics days of PARA. ?

## 2022-01-10 NOTE — Telephone Encounter (Signed)
Noted  

## 2022-01-11 NOTE — H&P (Signed)
? ?Portal Hypertension Clinic Visit ? ?Indication for evaluation: ? ?Melissa Huynh is a 61 y.o. female referred for evaluation in the [facility] Interventional Radiology Portal Hypertension Clinic due to HCV cirrhosis. ? ?Referring Physician/Established Gastroenterologist: ?Harper,Kristen S ? ?Virtual Visit via Telephone Note ?  ?I connected with on Mrs. Melissa Huynh at 15:00 EST by telephone and verified that I am speaking with the correct person using two identifiers. ?  ?I discussed the limitations, risks, security and privacy concerns of performing an evaluation and management service by telephone and the availability of in-person appointments. ? ? ?History of Present Illness: ? ?Melissa Huynh is a 61 y.o. female w Hx significant for HCV diagnosed in 2022, reportedly treated with (glecaprevir/pibrentasvir) Mayvret, who presented with decompensated cirrhosis in 10/2021. 1st paracentesis was 11/01/21 and Pt has been requiring q2wks paracenteses, most recently on 12/29/21 with 8L drawn. She had negative EGD + Colonoscopy on 11/30/21. She is closely followed by [facility]. She was started on diuretics (spironolactone 100mg  qD + furosemide 40mg  qD). She denies any variceal bleeding nor AMS. ? ?Review of Systems: A 12-point ROS discussed, and pertinent positives are indicated in the HPI above.  All other systems are negative.  ? ? ?Past Medical History:  ?Diagnosis Date  ? Anxiety   ? Back pain   ? Cirrhosis ([place])   ? Diabetes mellitus without complication ([place])   ? DJD (degenerative joint disease) 09/24/2010  ? Hepatitis C   ? patient reports completing Mavyret December 1st 2022. HCV RNA <05 October 2021.  ? High cholesterol   ? Hypertension   ? Migraine   ? ? ?Past Surgical History:  ?Procedure Laterality Date  ? CESAREAN SECTION    ? CHOLECYSTECTOMY    ? COLONOSCOPY WITH PROPOFOL N/A 11/30/2021  ? Procedure: COLONOSCOPY WITH PROPOFOL;  Surgeon: Eloise Harman, DO;  [facility]: [facility];   Service: Endoscopy;  Laterality: N/A;  9:15am  ? ESOPHAGOGASTRODUODENOSCOPY (EGD) WITH PROPOFOL N/A 11/30/2021  ? Procedure: ESOPHAGOGASTRODUODENOSCOPY (EGD) WITH PROPOFOL;  Surgeon: Eloise Harman, DO;  Location: [facility];  Service: Endoscopy;  Laterality: N/A;  ? IR RADIOLOGIST EVAL & MGMT  01/10/2022  ? TONSILLECTOMY    ? TUBAL LIGATION    ? ? ?Allergies: ?Baclofen, Flexeril [cyclobenzaprine], Other, Penicillins, Toradol [ketorolac tromethamine], and Tramadol ? ?Medications: ?Prior to Admission medications   ?Medication Sig Start Date End Date Taking? Authorizing Provider  ?acetaminophen (TYLENOL) 325 MG tablet Take 325 mg by mouth every 6 (six) hours as needed for moderate pain or headache.    [provider]  ?amitriptyline (ELAVIL) 50 MG tablet Take 50 mg by mouth at bedtime as needed for sleep.    [provider]  ?clonazePAM (KLONOPIN) 1 MG tablet Take 1 mg by mouth 3 (three) times daily. 10/23/21   [provider]  ?Cyanocobalamin (B-12 PO) Take 1 tablet by mouth daily.    [provider]  ?furosemide (LASIX) 40 MG tablet Take 1 tablet (40 mg total) by mouth daily. 11/30/21 05/29/22  Eloise Harman, DO  ?gabapentin (NEURONTIN) 300 MG capsule Take 300 mg by mouth at bedtime. 10/27/21   [provider]  ?glimepiride (AMARYL) 2 MG tablet Take 1 mg by mouth daily as needed (high blood sugar).    [provider]  ?metFORMIN (GLUCOPHAGE) 1000 MG tablet Take 500 mg by mouth 2 (two) times daily.    [provider]  ?Nutritional Supplements (NUTRITIONAL SHAKE HIGH PROTEIN) LIQD Take 1 Container by mouth  2 (two) times daily.    [provider]  ?spironolactone (ALDACTONE) 100 MG tablet Take 1 tablet (100 mg total) by mouth daily. 11/30/21 05/29/22  Eloise Harman, DO  ?  ? ?Family History  ?Problem Relation Age of Onset  ? Hypertension Mother   ? Cirrhosis Mother   ?     Unknown etiology s/p transplant x2  ? Hypertension Sister   ? Breast cancer  Sister   ? Lung cancer Sister   ? ? ?Social History  ? ?Socioeconomic History  ? Marital status: Married  ?  Spouse name: Not on file  ? Number of children: Not on file  ? Years of education: Not on file  ? Highest education level: Not on file  ?Occupational History  ? Not on file  ?Tobacco Use  ? Smoking status: Every Day  ?  Packs/day: 0.50  ?  Years: 30.00  ?  Pack years: 15.00  ?  Types: Cigarettes  ? Smokeless tobacco: Never  ?Vaping Use  ? Vaping Use: Never used  ?Substance and Sexual Activity  ? Alcohol use: Never  ? Drug use: Never  ? Sexual activity: Yes  ?  Birth control/protection: Surgical  ?Other Topics Concern  ? Not on file  ?Social History Narrative  ? Not on file  ? ?Social Determinants of Health  ? ?Financial Resource Strain: Not on file  ?Food Insecurity: Not on file  ?Transportation Needs: Not on file  ?Physical Activity: Not on file  ?Stress: Not on file  ?Social Connections: Not on file  ? ? ?Review of Systems ?As above ? ?Vital Signs: ?LMP 01/18/2014  ? ?Physical Exam ?Deferred secondary to virtual visit. ?  ? ?Imaging: ? ?MR Abdomen +C, 12/05/21 ?Independently reviewed demonstrating cirrhotic morphology of liver with large volume of ascites. ? ? ? ?US Paracentesis ? ?Result Date: 12/29/2021 ?INDICATION: Cirrhosis, ascites EXAM: ULTRASOUND GUIDED DIAGNOSTIC AND THERAPEUTIC PARACENTESIS MEDICATIONS: None. COMPLICATIONS: None immediate. PROCEDURE: Informed written consent was obtained from the patient after a discussion of the risks, benefits and alternatives to treatment. A timeout was performed prior to the initiation of the procedure. Initial ultrasound scanning demonstrates a large amount of ascites within the LEFT lower abdominal quadrant. The right lower abdomen was prepped and draped in the usual sterile fashion. 1% lidocaine was used for local anesthesia. Following this, a 5 [place] Yueh catheter was introduced. An ultrasound image was saved for documentation purposes. The paracentesis was  performed. The catheter was removed and a dressing was applied. The patient tolerated the procedure well without immediate post procedural complication. Patient received post-procedure intravenous albumin; see nursing notes for details. FINDINGS: A total of approximately 8 L of clear yellow ascitic fluid was removed. Samples were sent to the laboratory as requested by the clinical team. IMPRESSION: Successful ultrasound-guided paracentesis yielding 8 liters of peritoneal fluid. Electronically Signed   By: Lavonia Dana M.D.   On: 12/29/2021 12:11  ? ?US Paracentesis ? ?Result Date: 12/13/2021 ?INDICATION: Cirrhosis.  Ascites. EXAM: ULTRASOUND GUIDED LLQ PARACENTESIS MEDICATIONS: 10 cc 1% lidocaine. COMPLICATIONS: None immediate. PROCEDURE: Informed written consent was obtained from the patient after a discussion of the risks, benefits and alternatives to treatment. A timeout was performed prior to the initiation of the procedure. Initial ultrasound scanning demonstrates a large amount of ascites within the left lower abdominal quadrant. The left lower abdomen was prepped and draped in the usual sterile fashion. 1% lidocaine was used for local anesthesia. Following this, a Yueh catheter was  introduced. An ultrasound image was saved for documentation purposes. The paracentesis was performed. The catheter was removed and a dressing was applied. The patient tolerated the procedure well without immediate post procedural complication. Patient received post-procedure intravenous albumin; see nursing notes for details. FINDINGS: A total of approximately 8 liters of yellow fluid was removed. Maximum per ordering MD. Samples were sent to the laboratory as requested by the clinical team. IMPRESSION: Successful ultrasound-guided paracentesis yielding 8 liters of peritoneal fluid. PLAN: If the patient eventually requires >/=2 paracenteses in a 30 day period, candidacy for formal evaluation by the [place] [facility]  Portal Hypertension Clinic will be assessed. Read by Lavonia Drafts [facility] Electronically Signed   By: Titus Dubin M.D.   On: 12/13/2021 13:25  ? ?IR Radiologist Eval & Mgmt ? ?Result Date: 01/10/2022

## 2022-01-12 ENCOUNTER — Ambulatory Visit (HOSPITAL_COMMUNITY)
Admission: RE | Admit: 2022-01-12 | Discharge: 2022-01-12 | Disposition: A | Discharge status: Home / Self Care | Payer: Medicaid Other | Source: Ambulatory Visit | Attending: Gastroenterology | Admitting: Gastroenterology

## 2022-01-12 DIAGNOSIS — K7460 Unspecified cirrhosis of liver: Secondary | ICD-10-CM | POA: Diagnosis present

## 2022-01-12 DIAGNOSIS — R188 Other ascites: Secondary | ICD-10-CM | POA: Diagnosis present

## 2022-01-12 LAB — COMPREHENSIVE METABOLIC PANEL
ALT: 26 U/L (ref 0–44)
AST: 48 U/L — ABNORMAL HIGH (ref 15–41)
Albumin: 3.1 g/dL — ABNORMAL LOW (ref 3.5–5.0)
Alkaline Phosphatase: 153 U/L — ABNORMAL HIGH (ref 38–126)
Anion gap: 7 (ref 5–15)
BUN: 16 mg/dL (ref 6–20)
CO2: 26 mmol/L (ref 22–32)
Calcium: 8.8 mg/dL — ABNORMAL LOW (ref 8.9–10.3)
Chloride: 105 mmol/L (ref 98–111)
Creatinine, Ser: 0.64 mg/dL (ref 0.44–1.00)
GFR, Estimated: >60 mL/min (ref >60)
Glucose, Bld: 106 mg/dL — ABNORMAL HIGH (ref 70–99)
Potassium: 3.2 mmol/L — ABNORMAL LOW (ref 3.5–5.1)
Sodium: 138 mmol/L (ref 135–145)
Total Bilirubin: 0.7 mg/dL (ref 0.3–1.2)
Total Protein: 7.2 g/dL (ref 6.5–8.1)

## 2022-01-12 LAB — GRAM STAIN

## 2022-01-12 LAB — CBC WITH DIFFERENTIAL/PLATELET
Abs Immature Granulocytes: 0.03 10*3/uL (ref 0.00–0.07)
Basophils Absolute: 0.1 10*3/uL (ref 0.0–0.1)
Basophils Relative: 1 %
Eosinophils Absolute: 0.2 10*3/uL (ref 0.0–0.5)
Eosinophils Relative: 3 %
HCT: 39.6 % (ref 36.0–46.0)
Hemoglobin: 13 g/dL (ref 12.0–15.0)
Immature Granulocytes: 0 %
Lymphocytes Relative: 31 %
Lymphs Abs: 2.7 10*3/uL (ref 0.7–4.0)
MCH: 28.6 pg (ref 26.0–34.0)
MCHC: 32.8 g/dL (ref 30.0–36.0)
MCV: 87 fL (ref 80.0–100.0)
Monocytes Absolute: 0.9 10*3/uL (ref 0.1–1.0)
Monocytes Relative: 11 %
Neutro Abs: 4.7 10*3/uL (ref 1.7–7.7)
Neutrophils Relative %: 54 %
Platelets: 206 10*3/uL (ref 150–400)
RBC: 4.55 MIL/uL (ref 3.87–5.11)
RDW: 14.5 % (ref 11.5–15.5)
WBC: 8.6 10*3/uL (ref 4.0–10.5)
nRBC: 0 % (ref 0.0–0.2)

## 2022-01-12 LAB — BODY FLUID CELL COUNT WITH DIFFERENTIAL
Eos, Fluid: 0 %
Lymphs, Fluid: 81 %
Monocyte-Macrophage-Serous Fluid: 14 % — ABNORMAL LOW (ref 50–90)
Neutrophil Count, Fluid: 5 % (ref 0–25)
Total Nucleated Cell Count, Fluid: 181 cu mm (ref 0–1000)

## 2022-01-12 LAB — PROTIME-INR
INR: 1.2 (ref 0.8–1.2)
Prothrombin Time: 15.1 seconds (ref 11.4–15.2)

## 2022-01-12 MED ORDER — ALBUMIN HUMAN 25 % IV SOLN
INTRAVENOUS | Status: AC
  Administered 2022-01-12: 50 g via INTRAVENOUS
  Filled 2022-01-12: qty 50

## 2022-01-12 MED ORDER — ALBUMIN HUMAN 25 % IV SOLN: 50.0000 g | Freq: Once | INTRAVENOUS | Status: AC

## 2022-01-12 NOTE — Progress Notes (Signed)
PT tolerated left side paracentesis procedure and 50G of IV albumin well today and 6.7 Liters of clear yellow fluid removed with labs collected and sent for processing. PT verbalized understanding of discharge instructions and was transported to car to meet her husband via wheelchair.  ?

## 2022-01-15 LAB — PATHOLOGIST SMEAR REVIEW

## 2022-01-17 ENCOUNTER — Other Ambulatory Visit (HOSPITAL_COMMUNITY): Payer: Self-pay | Admitting: Interventional Radiology

## 2022-01-17 ENCOUNTER — Telehealth (HOSPITAL_COMMUNITY): Payer: Self-pay | Admitting: Radiology

## 2022-01-17 DIAGNOSIS — K7031 Alcoholic cirrhosis of liver with ascites: Secondary | ICD-10-CM

## 2022-01-17 LAB — CULTURE, BODY FLUID W GRAM STAIN -BOTTLE: Culture: NO GROWTH

## 2022-01-17 NOTE — Telephone Encounter (Signed)
Called pt, left VM for her to call me back to schedule her TIPS procedure at [facility] on 02/09/22 with Dr. Milford Cage. JM ?

## 2022-01-22 ENCOUNTER — Telehealth: Payer: Self-pay | Admitting: Internal Medicine

## 2022-01-22 ENCOUNTER — Other Ambulatory Visit: Payer: Self-pay

## 2022-01-22 DIAGNOSIS — R188 Other ascites: Secondary | ICD-10-CM

## 2022-01-22 NOTE — Telephone Encounter (Signed)
Spoke to pt, she informed me that she was in need of another paracentesis. Her stomach has started to swell and hurt. She has surgery May 19 and would like it don't done before then.  ?

## 2022-01-22 NOTE — Telephone Encounter (Signed)
RGA Clinical Pool:  ?Please arrange paracentesis.  ?Max 8L ?Albumin per protocol ?Labs: Body fluid cell count, culture, Gram stain. ?

## 2022-01-22 NOTE — Telephone Encounter (Signed)
Paracentesis scheduled for 01/26/22 at 11:30am, arrive at 11:00am.  ? ?Called pt, informed her of para appt. ?

## 2022-01-22 NOTE — Telephone Encounter (Signed)
Pt asking to speak with Bethann Berkshire, CMA. She said she called this morning and was transferred to Tammy's VM and I told her some of the phone extensions have changed and I would let Toni Amend know that she had called. [phone] ?

## 2022-01-25 ENCOUNTER — Ambulatory Visit (HOSPITAL_COMMUNITY)
Admission: RE | Admit: 2022-01-25 | Discharge: 2022-01-25 | Disposition: A | Discharge status: Home / Self Care | Payer: Medicaid Other | Source: Ambulatory Visit | Attending: Gastroenterology | Admitting: Gastroenterology

## 2022-01-25 ENCOUNTER — Ambulatory Visit: Payer: Medicaid Other | Admitting: Gastroenterology

## 2022-01-25 ENCOUNTER — Encounter (HOSPITAL_COMMUNITY): Payer: Self-pay

## 2022-01-25 DIAGNOSIS — R188 Other ascites: Secondary | ICD-10-CM | POA: Diagnosis present

## 2022-01-25 DIAGNOSIS — K7460 Unspecified cirrhosis of liver: Secondary | ICD-10-CM | POA: Insufficient documentation

## 2022-01-25 LAB — BODY FLUID CELL COUNT WITH DIFFERENTIAL
Lymphs, Fluid: 50 %
Monocyte-Macrophage-Serous Fluid: 48 % — ABNORMAL LOW (ref 50–90)
Neutrophil Count, Fluid: 2 % (ref 0–25)
Total Nucleated Cell Count, Fluid: 191 cu mm (ref 0–1000)

## 2022-01-25 LAB — GRAM STAIN

## 2022-01-25 MED ORDER — SODIUM CHLORIDE FLUSH 0.9 % IV SOLN
INTRAVENOUS | Status: AC
  Administered 2022-01-25: 10 mL
  Filled 2022-01-25: qty 10

## 2022-01-25 MED ORDER — ALBUMIN HUMAN 25 % IV SOLN: 0.0000 g | Freq: Once | INTRAVENOUS | Status: AC

## 2022-01-25 MED ORDER — ALBUMIN HUMAN 25 % IV SOLN
INTRAVENOUS | Status: AC
  Administered 2022-01-25: 50 g via INTRAVENOUS
  Filled 2022-01-25: qty 200

## 2022-01-25 NOTE — Procedures (Signed)
? ?  US guided RLQ paracentesis ? ?6.7 Liters clear yellow fluid ?Sent for labs per MD ? ?Tolerated well ? ?EBL: less than 1 cc ? ?Pt is scheduled for TIPs with Dr Milford Cage 02/09/22 ?

## 2022-01-25 NOTE — Progress Notes (Signed)
PT tolerated right sided paracentesis and 50G of IV albumin well today and 6.7 Liters of clear yellow fluid removed. PT verbalized understanding of discharge instructions and left via wheelchair with husband with no acute distress ordered. Labs collected and sent for processing.  ?

## 2022-01-26 ENCOUNTER — Ambulatory Visit (HOSPITAL_COMMUNITY): Admission: RE | Admit: 2022-01-26 | Payer: Medicaid Other | Source: Ambulatory Visit

## 2022-01-26 LAB — PATHOLOGIST SMEAR REVIEW

## 2022-01-30 LAB — CULTURE, BODY FLUID W GRAM STAIN -BOTTLE: Culture: NO GROWTH

## 2022-01-31 ENCOUNTER — Encounter: Payer: Self-pay | Admitting: Internal Medicine

## 2022-01-31 ENCOUNTER — Other Ambulatory Visit: Payer: Self-pay | Admitting: Gastroenterology

## 2022-01-31 ENCOUNTER — Ambulatory Visit (INDEPENDENT_AMBULATORY_CARE_PROVIDER_SITE_OTHER): Payer: Medicaid Other | Admitting: Internal Medicine

## 2022-01-31 ENCOUNTER — Other Ambulatory Visit: Payer: Self-pay | Admitting: *Deleted

## 2022-01-31 VITALS — BP 130/60 | HR 88 | Temp 97.6°F | Ht 69.0 in | Wt 168.4 lb

## 2022-01-31 DIAGNOSIS — B1920 Unspecified viral hepatitis C without hepatic coma: Secondary | ICD-10-CM

## 2022-01-31 DIAGNOSIS — R188 Other ascites: Secondary | ICD-10-CM

## 2022-01-31 DIAGNOSIS — K7460 Unspecified cirrhosis of liver: Secondary | ICD-10-CM | POA: Diagnosis not present

## 2022-01-31 NOTE — Progress Notes (Signed)
? ? ?Referring Provider: [facility] ?Primary Care Physician:  [facility] ?Primary GI:  Dr. Abbey Chatters ? ?Chief complaint-abdominal distention ? ? ?HPI:   ?Melissa Huynh is a 61 y.o. female who presents to the clinic today for follow-up visit.  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 ? ?Has had issues with portal hypertension with ascites, currently on Lasix 40 mg daily, spironolactone 100 mg daily.  Have been unable to titrate up in the past.  Has met with IR and was scheduled TIPS 02/09/2022.  Continues to require large-volume paracenteses every 10 to 14 days. ? ?EGD 11/30/2021 with portal hypertensive gastropathy.  No esophageal varices.  Repeat in 2 years. ? ?Colonoscopy 11/30/2021 without polyps.  10-year recall for screening purposes.  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 ? ?History of hepatic encephalopathy. ? ?MRI liver 12/05/2021 due to elevated AFP showed cirrhotic morphology, portal hypertension with ascites, no hepatoma. ? ?She does have history of hep C and reports being treated with Mavyret x8 weeks by Dr. Grandville Silos with [facility].  She finished her treatment 08/2021.  Not sure how she contracted hep C and was just diagnosed last year. ? ?Serological work-up of her cirrhosis did show elevated IgG 2307, ANA slightly positive, ASMA negative.  Ceruloplasmin normal, immune to hepatitis A, no immunity to hepatitis B, AFP elevated 15.5. AMA negative, A1A elevated. ? ?Alcohol: Never. ?Illicit drug use: Never ?Has tattoos: Has a couple that were completed professionally. ?Blood transfusion: None.  ?  ?Mother had 2 liver transplants. Unknown etiology of cirrhosis.  ?Oldest sister has history of breast and lung cancer.  ?No Fhx of colon cancer.  ?No personal or Fhx of autoimmune conditions.  ? ? ? ?Past Medical History:  ?Diagnosis Date  ? Anxiety   ? Back pain   ? Cirrhosis ([place])   ?  Diabetes mellitus without complication ([place])   ? DJD (degenerative joint disease) 09/24/2010  ? Hepatitis C   ? patient reports completing Mavyret December 1st 2022. HCV RNA <05 October 2021.  ? High cholesterol   ? Hypertension   ? Migraine   ? ? ?Past Surgical History:  ?Procedure Laterality Date  ? CESAREAN SECTION    ? CHOLECYSTECTOMY    ? COLONOSCOPY WITH PROPOFOL N/A 11/30/2021  ? Procedure: COLONOSCOPY WITH PROPOFOL;  Surgeon: Eloise Harman, DO;  [facility]: [facility];  Service: Endoscopy;  Laterality: N/A;  9:15am  ? ESOPHAGOGASTRODUODENOSCOPY (EGD) WITH PROPOFOL N/A 11/30/2021  ? Procedure: ESOPHAGOGASTRODUODENOSCOPY (EGD) WITH PROPOFOL;  Surgeon: Eloise Harman, DO;  Location: [facility];  Service: Endoscopy;  Laterality: N/A;  ? IR RADIOLOGIST EVAL & MGMT  01/10/2022  ? TONSILLECTOMY    ? TUBAL LIGATION    ? ? ?Current Outpatient Medications  ?Medication Sig Dispense Refill  ? acetaminophen (TYLENOL) 325 MG tablet Take 325 mg by mouth every 6 (six) hours as needed for moderate pain or headache.    ? amitriptyline (ELAVIL) 50 MG tablet Take 50 mg by mouth at bedtime as needed for sleep.    ? clonazePAM (KLONOPIN) 1 MG tablet Take 1 mg by mouth 3 (three) times daily.    ? Cyanocobalamin (B-12 PO) Take 1 tablet by mouth daily.    ? furosemide (LASIX) 40 MG tablet Take 1 tablet (40 mg total) by mouth daily. 30 tablet 5  ? gabapentin (NEURONTIN) 300 MG capsule Take 300 mg by mouth at  bedtime.    ? glimepiride (AMARYL) 2 MG tablet Take 1 mg by mouth daily as needed (high blood sugar).    ? metFORMIN (GLUCOPHAGE) 1000 MG tablet Take 500 mg by mouth 2 (two) times daily.    ? Nutritional Supplements (NUTRITIONAL SHAKE HIGH PROTEIN) LIQD Take 1 Container by mouth 2 (two) times daily.    ? spironolactone (ALDACTONE) 100 MG tablet Take 1 tablet (100 mg total) by mouth daily. 30 tablet 5  ? ?No current facility-administered medications for this visit.  ? ? ?Allergies as of 01/31/2022 - Review Complete  01/25/2022  ?Allergen Reaction Noted  ? Baclofen  11/22/2021  ? Flexeril [cyclobenzaprine] Other (See Comments) 02/11/2013  ? Other  05/15/2016  ? Penicillins Hives 02/11/2013  ? Toradol [ketorolac tromethamine] Rash 02/11/2013  ? Tramadol Rash 02/11/2013  ? ? ?Family History  ?Problem Relation Age of Onset  ? Hypertension Mother   ? Cirrhosis Mother   ?     Unknown etiology s/p transplant x2  ? Hypertension Sister   ? Breast cancer Sister   ? Lung cancer Sister   ? ? ?Social History  ? ?Socioeconomic History  ? Marital status: Married  ?  Spouse name: Not on file  ? Number of children: Not on file  ? Years of education: Not on file  ? Highest education level: Not on file  ?Occupational History  ? Not on file  ?Tobacco Use  ? Smoking status: Every Day  ?  Packs/day: 0.50  ?  Years: 30.00  ?  Pack years: 15.00  ?  Types: Cigarettes  ? Smokeless tobacco: Never  ?Vaping Use  ? Vaping Use: Never used  ?Substance and Sexual Activity  ? Alcohol use: Never  ? Drug use: Never  ? Sexual activity: Yes  ?  Birth control/protection: Surgical  ?Other Topics Concern  ? Not on file  ?Social History Narrative  ? Not on file  ? ?Social Determinants of Health  ? ?Financial Resource Strain: Not on file  ?Food Insecurity: Not on file  ?Transportation Needs: Not on file  ?Physical Activity: Not on file  ?Stress: Not on file  ?Social Connections: Not on file  ? ? ?Subjective: ?Review of Systems  ?Constitutional:  Negative for chills and fever.  ?HENT:  Negative for congestion and hearing loss.   ?Eyes:  Negative for blurred vision and double vision.  ?Respiratory:  Negative for cough and shortness of breath.   ?Cardiovascular:  Negative for chest pain and palpitations.  ?Gastrointestinal:  Negative for abdominal pain, blood in stool, constipation, diarrhea, heartburn, melena and vomiting.  ?Genitourinary:  Negative for dysuria and urgency.  ?Musculoskeletal:  Negative for joint pain and myalgias.  ?Skin:  Negative for itching and rash.   ?Neurological:  Negative for dizziness and headaches.  ?Psychiatric/Behavioral:  Negative for depression. The patient is not nervous/anxious.   ? ? ?Objective: ?LMP 01/18/2014  ?Physical Exam ?Constitutional:   ?   Appearance: Normal appearance.  ?HENT:  ?   Head: Normocephalic and atraumatic.  ?Eyes:  ?   Extraocular Movements: Extraocular movements intact.  ?   Conjunctiva/sclera: Conjunctivae normal.  ?Cardiovascular:  ?   Rate and Rhythm: Normal rate and regular rhythm.  ?Pulmonary:  ?   Effort: Pulmonary effort is normal.  ?   Breath sounds: Normal breath sounds.  ?Abdominal:  ?   General: Bowel sounds are normal.  ?   Palpations: Abdomen is soft.  ?Musculoskeletal:     ?   General:  No swelling. Normal range of motion.  ?   Cervical back: Normal range of motion and neck supple.  ?Skin: ?   General: Skin is warm and dry.  ?   Coloration: Skin is not jaundiced.  ?Neurological:  ?   General: No focal deficit present.  ?   Mental Status: She is alert and oriented to person, place, and time.  ?Psychiatric:     ?   Mood and Affect: Mood normal.     ?   Behavior: Behavior normal.  ? ? ? ?Assessment: ?*Decompensated cirrhosis-combination of HCV and NAFLD ?*Portal hypertension with ascites ?*Chronic hepatitis C status posttreatment with Mavyret x8 weeks ? ?Plan: ?Discussed cirrhosis in depth with patient today.  Likely etiology combination of chronic hepatitis C and nonalcoholic fatty liver disease. ? ?Serological work-up did show slightly positive ANA and elevated IgG of 2300.  Family history of multiple transplants in her mother of unknown etiology.  Most recent aminotransferases WNL. ? ?Can consider liver biopsy at time of TIPS.  Will discuss with IR. ? ?Continue on Lasix and Aldactone 40 mg and 100 mg respectively.  We will plan on paracentesis 1 to 2 days prior to TIPS procedure on 02/09/2022. ? ?Blood work today including MELD labs as well as hepatitis C viral load to ensure eradication. ? ?EGD for variceal  screening due March 2025. ? ?Colonoscopy recall 2033. ? ?Hepatoma screening with ultrasound due September 2023. ? ?Follow-up in 3 months. ? ? ? ?01/31/2022 11:01 AM ? ? ?Disclaimer: This note was dictated with voice recogni

## 2022-01-31 NOTE — Patient Instructions (Signed)
We will order paracentesis to be performed next week prior to your TIPS procedure. ? ?Continue on furosemide and spironolactone.  Continue to monitor daily weights. ? ?I will check blood work today including your kidneys, liver numbers, electrolytes, as well as to ensure eradication of your hepatitis C. ? ?We will plan a repeat EGD 2025, repeat colonoscopy in 10 years. ? ?Follow-up with GI in 2 to 3 months. ? ?It was very nice seeing you again today. ? ?Dr. Marletta Lor ? ?At [facility] we value your feedback. You may receive a survey about your visit today. Please share your experience as we strive to create trusting relationships with our patients to provide genuine, compassionate, quality care. ? ?We appreciate your understanding and patience as we review any laboratory studies, imaging, and other diagnostic tests that are ordered as we care for you. Our office policy is 5 business days for review of these results, and any emergent or urgent results are addressed in a timely manner for your best interest. If you do not hear from our office in 1 week, please contact us.  ? ?We also encourage the use of MyChart, which contains your medical information for your review as well. If you are not enrolled in this feature, an access code is on this after visit summary for your convenience. Thank you for allowing [place] to be involved in your care. ? ?It was great to see you today!  I hope you have a great rest of your Spring! ? ? ? ?Hennie Duos. Marletta Lor, D.O. ?Gastroenterology and Hepatology ?[facility] ? ?

## 2022-02-02 LAB — COMPLETE METABOLIC PANEL WITH GFR
AG Ratio: 1.2 (calc) (ref 1.0–2.5)
ALT: 25 U/L (ref 6–29)
AST: 51 U/L — ABNORMAL HIGH (ref 10–35)
Albumin: 3.6 g/dL (ref 3.6–5.1)
Alkaline phosphatase (APISO): 180 U/L — ABNORMAL HIGH (ref 37–153)
BUN: 13 mg/dL (ref 7–25)
CO2: 28 mmol/L (ref 20–32)
Calcium: 8.8 mg/dL (ref 8.6–10.4)
Chloride: 106 mmol/L (ref 98–110)
Creat: 0.59 mg/dL (ref 0.50–1.03)
Globulin: 3.1 g/dL (calc) (ref 1.9–3.7)
Glucose, Bld: 122 mg/dL — ABNORMAL HIGH (ref 65–99)
Potassium: 4.7 mmol/L (ref 3.5–5.3)
Sodium: 139 mmol/L (ref 135–146)
Total Bilirubin: 0.7 mg/dL (ref 0.2–1.2)
Total Protein: 6.7 g/dL (ref 6.1–8.1)
eGFR: 104 mL/min/{1.73_m2} (ref >=60)

## 2022-02-02 LAB — HEPATITIS C RNA QUANTITATIVE
HCV Quantitative Log: <1.18 log IU/mL
HCV RNA, PCR, QN: <15 IU/mL

## 2022-02-02 LAB — PROTIME-INR
INR: 1.1
Prothrombin Time: 11.8 s — ABNORMAL HIGH (ref 9.0–11.5)

## 2022-02-05 NOTE — Progress Notes (Signed)
Surgical Instructions ? ? ? Your procedure is scheduled on 02/09/22. ? Report to [facility] Main Entrance "A" at 6:30 A.M., then check in with the Admitting office. ? Call this number if you have problems the morning of surgery: ? [phone] ? ? If you have any questions prior to your surgery date call [phone]: Open Monday-Friday 8am-4pm ? ? ? Remember: ? Do not eat after midnight the night before your surgery ? ?You may drink clear liquids until 5:30am the morning of your surgery.   ?Clear liquids allowed are: Water, Non-Citrus Juices (without pulp), Carbonated Beverages, Clear Tea, Black Coffee ONLY (NO MILK, CREAM OR POWDERED CREAMER of any kind), and Gatorade ?  ? Take these medicines the morning of surgery with A SIP OF WATER:  ?clonazePAM (KLONOPIN)  ? ?IF NEEDED: ?acetaminophen (TYLENOL)  ?gabapentin (NEURONTIN)  ? ?As of today, STOP taking any Aspirin (unless otherwise instructed by your surgeon) Aleve, Naproxen, Ibuprofen, Motrin, Advil, Goody's, BC's, all herbal medications, fish oil, and all vitamins. ? ?WHAT DO I DO ABOUT MY DIABETES MEDICATION? ? ? ?Do not take oral diabetes medicines (pills) the morning of surgery. ? ?THE MORNING OF SURGERY, do not take metFORMIN (GLUCOPHAGE). ? ?The day of surgery, do not take other diabetes injectables, including Byetta (exenatide), Bydureon (exenatide ER), Victoza (liraglutide), or Trulicity (dulaglutide). ? ?If your CBG is greater than 220 mg/dL, you may take ? of your sliding scale (correction) dose of insulin. ? ? ?HOW TO MANAGE YOUR DIABETES ?BEFORE AND AFTER SURGERY ? ?Why is it important to control my blood sugar before and after surgery? ?Improving blood sugar levels before and after surgery helps healing and can limit problems. ?A way of improving blood sugar control is eating a healthy diet by: ? Eating less sugar and carbohydrates ? Increasing activity/exercise ? Talking with your doctor about reaching your blood sugar goals ?High blood sugars  (greater than 180 mg/dL) can raise your risk of infections and slow your recovery, so you will need to focus on controlling your diabetes during the weeks before surgery. ?Make sure that the doctor who takes care of your diabetes knows about your planned surgery including the date and location. ? ?How do I manage my blood sugar before surgery? ?Check your blood sugar at least 4 times a day, starting 2 days before surgery, to make sure that the level is not too high or low. ? ?Check your blood sugar the morning of your surgery when you wake up and every 2 hours until you get to the Short Stay unit. ? ?If your blood sugar is less than 70 mg/dL, you will need to treat for low blood sugar: ?Do not take insulin. ?Treat a low blood sugar (less than 70 mg/dL) with ? cup of clear juice (cranberry or apple), 4 glucose tablets, OR glucose gel. ?Recheck blood sugar in 15 minutes after treatment (to make sure it is greater than 70 mg/dL). If your blood sugar is not greater than 70 mg/dL on recheck, call [phone] for further instructions. ?Report your blood sugar to the short stay nurse when you get to Short Stay. ? ?If you are admitted to the hospital after surgery: ?Your blood sugar will be checked by the staff and you will probably be given insulin after surgery (instead of oral diabetes medicines) to make sure you have good blood sugar levels. ?The goal for blood sugar control after surgery is 80-180 mg/dL. ? ?         ?Do not wear  jewelry or makeup ?Do not wear lotions, powders, perfumes/colognes, or deodorant. ?Do not shave 48 hours prior to surgery.   ?Do not bring valuables to the hospital. ?Do not wear nail polish, gel polish, artificial nails, or any other type of covering on natural nails (fingers and toes) ?If you have artificial nails or gel coating that need to be removed by a nail salon, please have this removed prior to surgery. Artificial nails or gel coating may interfere with anesthesia's ability to  adequately monitor your vital signs. ? ?[facility] is not responsible for any belongings or valuables. .  ? ?Do NOT Smoke (Tobacco/Vaping)  24 hours prior to your procedure ? ?If you use a CPAP at night, you may bring your mask for your overnight stay. ?  ?Contacts, glasses, hearing aids, dentures or partials may not be worn into surgery, please bring cases for these belongings ?  ?For patients admitted to the hospital, discharge time will be determined by your treatment team. ?  ?Patients discharged the day of surgery will not be allowed to drive home, and someone needs to stay with them for 24 hours. ? ? ?SURGICAL WAITING ROOM VISITATION ?Patients having surgery or a procedure in a hospital may have two support people. ?Children under the age of 60 must have an adult with them who is not the patient. ?They may stay in the waiting area during the procedure and may switch out with other visitors. If the patient needs to stay at the hospital during part of their recovery, the visitor guidelines for inpatient rooms apply. ? ?Please refer to the [facility] website for the visitor guidelines for Inpatients (after your surgery is over and you are in a regular room).  ? ? ? ? ? ?Special instructions:   ? ?Oral Hygiene is also important to reduce your risk of infection.  Remember - BRUSH YOUR TEETH THE MORNING OF SURGERY WITH YOUR REGULAR TOOTHPASTE ? ? ?- Preparing For Surgery ? ?Before surgery, you can play an important role. Because skin is not sterile, your skin needs to be as free of germs as possible. You can reduce the number of germs on your skin by washing with CHG (chlorahexidine gluconate) Soap before surgery.  CHG is an antiseptic cleaner which kills germs and bonds with the skin to continue killing germs even after washing.   ? ? ?Please do not use if you have an allergy to CHG or antibacterial soaps. If your skin becomes reddened/irritated stop using the CHG.  ?Do not shave (including legs and  underarms) for at least 48 hours prior to first CHG shower. It is OK to shave your face. ? ?Please follow these instructions carefully. ?  ? ? Shower the NIGHT BEFORE SURGERY and the MORNING OF SURGERY with CHG Soap.  ? If you chose to wash your hair, wash your hair first as usual with your normal shampoo. After you shampoo, rinse your hair and body thoroughly to remove the shampoo.  Then Nucor CorporationWash Face and genitals (private parts) with your normal soap and rinse thoroughly to remove soap. ? ?After that Use CHG Soap as you would any other liquid soap. You can apply CHG directly to the skin and wash gently with a scrungie or a clean washcloth.  ? ?Apply the CHG Soap to your body ONLY FROM THE NECK DOWN.  Do not use on open wounds or open sores. Avoid contact with your eyes, ears, mouth and genitals (private parts). Wash Face and genitals (private  parts)  with your normal soap.  ? ?Wash thoroughly, paying special attention to the area where your surgery will be performed. ? ?Thoroughly rinse your body with warm water from the neck down. ? ?DO NOT shower/wash with your normal soap after using and rinsing off the CHG Soap. ? ?Pat yourself dry with a CLEAN TOWEL. ? ?Wear CLEAN PAJAMAS to bed the night before surgery ? ?Place CLEAN SHEETS on your bed the night before your surgery ? ?DO NOT SLEEP WITH PETS. ? ? ?Day of Surgery: ?Take a shower with CHG soap. ?Wear Clean/Comfortable clothing the morning of surgery ?Do not apply any deodorants/lotions.   ?Remember to brush your teeth WITH YOUR REGULAR TOOTHPASTE. ? ? ? ?If you received a COVID test during your pre-op visit, it is requested that you wear a mask when out in public, stay away from anyone that may not be feeling well, and notify your surgeon if you develop symptoms. If you have been in contact with anyone that has tested positive in the last 10 days, please notify your surgeon. ? ?  ?Please read over the following fact sheets that you were given.  ? ?

## 2022-02-06 ENCOUNTER — Encounter (HOSPITAL_COMMUNITY): Payer: Self-pay | Admitting: Physician Assistant

## 2022-02-06 ENCOUNTER — Encounter (HOSPITAL_COMMUNITY)
Admission: RE | Admit: 2022-02-06 | Discharge: 2022-02-06 | Disposition: A | Discharge status: Home / Self Care | Payer: Medicaid Other | Source: Ambulatory Visit | Attending: Interventional Radiology | Admitting: Interventional Radiology

## 2022-02-06 ENCOUNTER — Encounter (HOSPITAL_COMMUNITY): Payer: Self-pay

## 2022-02-06 ENCOUNTER — Other Ambulatory Visit: Payer: Self-pay

## 2022-02-06 VITALS — BP 172/86 | HR 100 | Temp 98.2°F | Resp 17

## 2022-02-06 DIAGNOSIS — E119 Type 2 diabetes mellitus without complications: Secondary | ICD-10-CM | POA: Insufficient documentation

## 2022-02-06 DIAGNOSIS — Z01812 Encounter for preprocedural laboratory examination: Secondary | ICD-10-CM | POA: Diagnosis not present

## 2022-02-06 DIAGNOSIS — Z01818 Encounter for other preprocedural examination: Secondary | ICD-10-CM

## 2022-02-06 LAB — GLUCOSE, CAPILLARY: Glucose-Capillary: 126 mg/dL — ABNORMAL HIGH (ref 70–99)

## 2022-02-06 LAB — SURGICAL PCR SCREEN
MRSA, PCR: NEGATIVE
Staphylococcus aureus: POSITIVE — AB

## 2022-02-06 NOTE — Progress Notes (Signed)
PCP - Roma Kayser, PA-C at  [facility] ?Cardiologist - denies. Patient states she saw a cardiologist once over 10 years ago. Does not remember name of cardiologist.  ? ?PPM/ICD - n/a ? ?Chest x-ray - n/a ?EKG - 11/28/21 ?Stress Test - denies ?ECHO - denies ?Cardiac Cath - denies ? ?Sleep Study - denies ? ? ?CBG today- 126 ?Checks Blood Sugar two times a day ?Range 100-120 per patient ? ?Blood Thinner Instructions: n/a ?Aspirin Instructions: n/a ? ?ERAS Protcol - Clear liquids until 0530 am day of surgery ?PRE-SURGERY Ensure or G2- n/a ? ?COVID TEST- n/a ?No orders. Spoke to Antionette Poles, PA and Jaynie Collins, PA. Ok to obtain T&S. Other labs done within 30 days of surgery date. ? ? ?Anesthesia review: Yes. EKG Review ? ?Patient denies shortness of breath, fever, cough and chest pain at PAT appointment ? ? ?All instructions explained to the patient, with a verbal understanding of the material. Patient agrees to go over the instructions while at home for a better understanding. The opportunity to ask questions was provided. ? ? ?

## 2022-02-07 ENCOUNTER — Encounter (HOSPITAL_COMMUNITY): Payer: Self-pay

## 2022-02-07 ENCOUNTER — Telehealth: Payer: Self-pay | Admitting: *Deleted

## 2022-02-07 ENCOUNTER — Ambulatory Visit (HOSPITAL_COMMUNITY)
Admission: RE | Admit: 2022-02-07 | Discharge: 2022-02-07 | Disposition: A | Discharge status: Home / Self Care | Payer: Medicaid Other | Source: Ambulatory Visit | Attending: Internal Medicine | Admitting: Internal Medicine

## 2022-02-07 ENCOUNTER — Other Ambulatory Visit: Payer: Self-pay | Admitting: Interventional Radiology

## 2022-02-07 DIAGNOSIS — R188 Other ascites: Secondary | ICD-10-CM | POA: Insufficient documentation

## 2022-02-07 DIAGNOSIS — K7460 Unspecified cirrhosis of liver: Secondary | ICD-10-CM | POA: Diagnosis present

## 2022-02-07 DIAGNOSIS — Z01818 Encounter for other preprocedural examination: Secondary | ICD-10-CM

## 2022-02-07 LAB — BODY FLUID CELL COUNT WITH DIFFERENTIAL
Eos, Fluid: 0 %
Lymphs, Fluid: 40 %
Monocyte-Macrophage-Serous Fluid: 59 % (ref 50–90)
Neutrophil Count, Fluid: 1 % (ref 0–25)
Total Nucleated Cell Count, Fluid: 123 cu mm (ref 0–1000)

## 2022-02-07 LAB — GRAM STAIN

## 2022-02-07 MED ORDER — ALBUMIN HUMAN 25 % IV SOLN
0.0000 g | Freq: Once | INTRAVENOUS | Status: AC
  Filled 2022-02-07: qty 400

## 2022-02-07 MED ORDER — ALBUMIN HUMAN 25 % IV SOLN
INTRAVENOUS | Status: AC
  Administered 2022-02-07: 50 g via INTRAVENOUS
  Filled 2022-02-07: qty 200

## 2022-02-07 NOTE — Telephone Encounter (Signed)
Patient having surgery on 5/19 [facility]IPS at [facility]. States she saw on mychart MRSA test was positive. States she is not having any symptoms and at the time of the MRSA test she was told even if it was positive they would do procedure and give her antibiotics. Patient had concern if she should get antibiotics today or wait til her surgery.  ? ?[phone] ?

## 2022-02-07 NOTE — Procedures (Signed)
INDICATION: ?Ascites  ?EXAM: ?ULTRASOUND GUIDED LEFT PARACENTESIS ?GRIP-IR: ?Category: Fluids ? ?Subcategory: Paracentesis ? ?Follow-Up: None ? ?MEDICATIONS: ?None. ?COMPLICATIONS: ?None immediate. ?PROCEDURE: ?The procedure was performed by Sheliah Plane, PA, and supervised by Gaylyn Rong, MD.  ? ?Informed written consent was obtained from the patient after a discussion of the risks, benefits and alternatives to treatment. A timeout was performed prior to the initiation of the procedure. ? ?Initial ultrasound scanning demonstrates a large amount of ascites within the left lower abdominal quadrant. The left lower abdomen was prepped and draped in the usual sterile fashion. 1% lidocaine  was used for local anesthesia.  Ultrasound was utilized for targeting.  ? ?Following this, a catheter was introduced.  The paracentesis was performed. The catheter was removed and a dressing was applied. The patient tolerated the procedure well without immediate post procedural complication.  ? ?FINDINGS: ?A total of approximately 7.5 L of straw-colored fluid was removed. Samples were sent to the laboratory as requested by the clinical team. ?IMPRESSION:  ?Successful ultrasound-guided paracentesis yielding 7.5 liters of peritoneal fluid. ? ?

## 2022-02-07 NOTE — Telephone Encounter (Signed)
Called and discussed with patient per [place] like her MRSA test was negative, but Staphylococcus aureus was positive.  She will need to follow-up with whoever ordered these test for recommendations ?Patient verbalized understanding.  ?

## 2022-02-07 NOTE — Telephone Encounter (Signed)
Looks like her MRSA test was negative, but Staphylococcus aureus was positive.  She will need to follow-up with whoever ordered these test for recommendations.  ?

## 2022-02-07 NOTE — Progress Notes (Signed)
PT tolerated left sided paracentesis procedure and 50G of IV albumin well today and 7.5 Liters of clear yellow fluid removed with labs collected and sent for processing. PT verbalized understanding of discharge instructions and left via wheelchair with no acute distress noted.  ?

## 2022-02-08 ENCOUNTER — Ambulatory Visit (HOSPITAL_COMMUNITY)
Admission: RE | Admit: 2022-02-08 | Discharge: 2022-02-08 | Disposition: A | Discharge status: Home / Self Care | Payer: Medicaid Other | Source: Ambulatory Visit | Attending: Interventional Radiology | Admitting: Interventional Radiology

## 2022-02-08 ENCOUNTER — Other Ambulatory Visit: Payer: Self-pay | Admitting: Radiology

## 2022-02-08 DIAGNOSIS — Z01818 Encounter for other preprocedural examination: Secondary | ICD-10-CM | POA: Insufficient documentation

## 2022-02-08 DIAGNOSIS — K7460 Unspecified cirrhosis of liver: Secondary | ICD-10-CM

## 2022-02-08 DIAGNOSIS — Z0189 Encounter for other specified special examinations: Secondary | ICD-10-CM | POA: Diagnosis not present

## 2022-02-08 LAB — ECHOCARDIOGRAM COMPLETE
Area-P 1/2: 2.41 cm2
Calc EF: 68.8 %
MV VTI: 2.72 cm2
S' Lateral: 2.9 cm
Single Plane A2C EF: 62.6 %
Single Plane A4C EF: 72.8 %

## 2022-02-08 NOTE — Anesthesia Preprocedure Evaluation (Addendum)
Anesthesia Evaluation  Patient identified by MRN, date of birth, ID band Patient awake    Reviewed: Allergy & Precautions, NPO status , Patient's Chart, lab work & pertinent test results  Airway Mallampati: I  TM Distance: >3 FB Neck ROM: Full    Dental  (+) Edentulous Upper, Edentulous Lower, Dental Advisory Given   Pulmonary Current SmokerPatient did not abstain from smoking.,    Pulmonary exam normal breath sounds clear to auscultation       Cardiovascular hypertension, Normal cardiovascular exam Rhythm:Regular Rate:Normal  TTE 2023 1. Left ventricular ejection fraction, by estimation, is 60 to 65%. The  left ventricle has normal function. The left ventricle has no regional  wall motion abnormalities. Left ventricular diastolic parameters were  normal.  2. Right ventricular systolic function is normal. The right ventricular  size is normal. There is normal pulmonary artery systolic pressure.  3. The mitral valve is normal in structure. Mild mitral valve  regurgitation.  4. The aortic valve is tricuspid. Aortic valve regurgitation is not  visualized.  5. The inferior vena cava is normal in size with greater than 50%  respiratory variability, suggesting right atrial pressure of 3 mmHg.    Neuro/Psych  Headaches, PSYCHIATRIC DISORDERS Anxiety    GI/Hepatic negative GI ROS, (+) Cirrhosis   ascites    , Hepatitis -, C  Endo/Other  diabetes, Oral Hypoglycemic Agents  Renal/GU negative Renal ROS  negative genitourinary   Musculoskeletal negative musculoskeletal ROS (+)   Abdominal   Peds  Hematology negative hematology ROS (+)   Anesthesia Other Findings   Reproductive/Obstetrics                            Anesthesia Physical Anesthesia Plan  ASA: 3  Anesthesia Plan: General   Post-op Pain Management: Minimal or no pain anticipated   Induction: Intravenous  PONV Risk Score  and Plan: 2 and Midazolam, Dexamethasone and Ondansetron  Airway Management Planned: Oral ETT  Additional Equipment: Arterial line  Intra-op Plan:   Post-operative Plan: Extubation in OR  Informed Consent: I have reviewed the patients History and Physical, chart, labs and discussed the procedure including the risks, benefits and alternatives for the proposed anesthesia with the patient or authorized representative who has indicated his/her understanding and acceptance.     Dental advisory given  Plan Discussed with: CRNA  Anesthesia Plan Comments:         Anesthesia Quick Evaluation

## 2022-02-08 NOTE — Progress Notes (Signed)
*  PRELIMINARY RESULTS* Echocardiogram 2D Echocardiogram has been performed.  Carolyne Fiscal 02/08/2022, 9:26 AM

## 2022-02-09 ENCOUNTER — Encounter (HOSPITAL_COMMUNITY): Payer: Self-pay | Admitting: Interventional Radiology

## 2022-02-09 ENCOUNTER — Ambulatory Visit (HOSPITAL_COMMUNITY): Payer: Medicaid Other | Admitting: Physician Assistant

## 2022-02-09 ENCOUNTER — Ambulatory Visit (HOSPITAL_BASED_OUTPATIENT_CLINIC_OR_DEPARTMENT_OTHER): Payer: Medicaid Other | Admitting: Certified Registered Nurse Anesthetist

## 2022-02-09 ENCOUNTER — Observation Stay (HOSPITAL_COMMUNITY)
Admission: RE | Admit: 2022-02-09 | Discharge: 2022-02-10 | Disposition: A | Discharge status: Home / Self Care | Payer: Medicaid Other | Attending: Interventional Radiology | Admitting: Interventional Radiology

## 2022-02-09 ENCOUNTER — Observation Stay (HOSPITAL_COMMUNITY)
Admission: RE | Admit: 2022-02-09 | Discharge: 2022-02-09 | Disposition: A | Discharge status: Home / Self Care | Payer: Medicaid Other | Source: Ambulatory Visit | Attending: Interventional Radiology | Admitting: Interventional Radiology

## 2022-02-09 ENCOUNTER — Other Ambulatory Visit: Payer: Self-pay

## 2022-02-09 ENCOUNTER — Encounter (HOSPITAL_COMMUNITY)
Admission: RE | Disposition: A | Discharge status: Home / Self Care | Payer: Self-pay | Source: Home / Self Care | Attending: Interventional Radiology

## 2022-02-09 DIAGNOSIS — R188 Other ascites: Secondary | ICD-10-CM | POA: Insufficient documentation

## 2022-02-09 DIAGNOSIS — K7031 Alcoholic cirrhosis of liver with ascites: Principal | ICD-10-CM

## 2022-02-09 DIAGNOSIS — K7460 Unspecified cirrhosis of liver: Secondary | ICD-10-CM | POA: Diagnosis present

## 2022-02-09 DIAGNOSIS — I272 Pulmonary hypertension, unspecified: Secondary | ICD-10-CM | POA: Diagnosis not present

## 2022-02-09 DIAGNOSIS — B1920 Unspecified viral hepatitis C without hepatic coma: Secondary | ICD-10-CM

## 2022-02-09 DIAGNOSIS — F17210 Nicotine dependence, cigarettes, uncomplicated: Secondary | ICD-10-CM | POA: Insufficient documentation

## 2022-02-09 DIAGNOSIS — Z7984 Long term (current) use of oral hypoglycemic drugs: Secondary | ICD-10-CM | POA: Diagnosis not present

## 2022-02-09 DIAGNOSIS — E119 Type 2 diabetes mellitus without complications: Secondary | ICD-10-CM

## 2022-02-09 DIAGNOSIS — I10 Essential (primary) hypertension: Secondary | ICD-10-CM | POA: Diagnosis not present

## 2022-02-09 DIAGNOSIS — Z79899 Other long term (current) drug therapy: Secondary | ICD-10-CM | POA: Insufficient documentation

## 2022-02-09 DIAGNOSIS — K766 Portal hypertension: Secondary | ICD-10-CM | POA: Insufficient documentation

## 2022-02-09 HISTORY — PX: IR IVUS EACH ADDITIONAL NON CORONARY VESSEL: IMG6086

## 2022-02-09 HISTORY — PX: IR TIPS: IMG2295

## 2022-02-09 HISTORY — PX: IR PARACENTESIS: IMG2679

## 2022-02-09 HISTORY — PX: RADIOLOGY WITH ANESTHESIA: SHX6223

## 2022-02-09 HISTORY — PX: IR US GUIDE VASC ACCESS RIGHT: IMG2390

## 2022-02-09 LAB — GLUCOSE, CAPILLARY
Glucose-Capillary: 186 mg/dL — ABNORMAL HIGH (ref 70–99)
Glucose-Capillary: 90 mg/dL (ref 70–99)
Glucose-Capillary: 200 mg/dL — ABNORMAL HIGH (ref 70–99)
Glucose-Capillary: 246 mg/dL — ABNORMAL HIGH (ref 70–99)

## 2022-02-09 LAB — COMPREHENSIVE METABOLIC PANEL
ALT: 25 U/L (ref 0–44)
AST: 41 U/L (ref 15–41)
Albumin: 3.2 g/dL — ABNORMAL LOW (ref 3.5–5.0)
Alkaline Phosphatase: 132 U/L — ABNORMAL HIGH (ref 38–126)
Anion gap: 6 (ref 5–15)
BUN: 13 mg/dL (ref 6–20)
CO2: 23 mmol/L (ref 22–32)
Calcium: 9.1 mg/dL (ref 8.9–10.3)
Chloride: 108 mmol/L (ref 98–111)
Creatinine, Ser: 0.76 mg/dL (ref 0.44–1.00)
GFR, Estimated: >60 mL/min (ref >60)
Glucose, Bld: 185 mg/dL — ABNORMAL HIGH (ref 70–99)
Potassium: 3.7 mmol/L (ref 3.5–5.1)
Sodium: 137 mmol/L (ref 135–145)
Total Bilirubin: 0.4 mg/dL (ref 0.3–1.2)
Total Protein: 6.2 g/dL — ABNORMAL LOW (ref 6.5–8.1)

## 2022-02-09 LAB — CBC
HCT: 35.2 % — ABNORMAL LOW (ref 36.0–46.0)
Hemoglobin: 11.9 g/dL — ABNORMAL LOW (ref 12.0–15.0)
MCH: 29.9 pg (ref 26.0–34.0)
MCHC: 33.8 g/dL (ref 30.0–36.0)
MCV: 88.4 fL (ref 80.0–100.0)
Platelets: 149 10*3/uL — ABNORMAL LOW (ref 150–400)
RBC: 3.98 MIL/uL (ref 3.87–5.11)
RDW: 14.5 % (ref 11.5–15.5)
WBC: 7 10*3/uL (ref 4.0–10.5)
nRBC: 0 % (ref 0.0–0.2)

## 2022-02-09 LAB — PREPARE RBC (CROSSMATCH)

## 2022-02-09 LAB — ABO/RH: ABO/RH(D): AB POS

## 2022-02-09 LAB — PROTIME-INR
INR: 1.2 (ref 0.8–1.2)
Prothrombin Time: 14.9 seconds (ref 11.4–15.2)

## 2022-02-09 SURGERY — IR WITH ANESTHESIA
Anesthesia: General

## 2022-02-09 MED ORDER — INSULIN ASPART 100 UNIT/ML IJ SOLN
0.0000 [IU] | INTRAMUSCULAR | Status: DC | PRN
  Administered 2022-02-09: 4 [IU] via SUBCUTANEOUS

## 2022-02-09 MED ORDER — EPHEDRINE SULFATE-NACL 50-0.9 MG/10ML-% IV SOSY
PREFILLED_SYRINGE | INTRAVENOUS | Status: DC | PRN
  Administered 2022-02-09: 5 mg via INTRAVENOUS

## 2022-02-09 MED ORDER — PHENYLEPHRINE HCL-NACL 20-0.9 MG/250ML-% IV SOLN
INTRAVENOUS | Status: DC | PRN
  Administered 2022-02-09: 25 ug/min via INTRAVENOUS

## 2022-02-09 MED ORDER — PROPOFOL 10 MG/ML IV BOLUS
INTRAVENOUS | Status: DC | PRN
Start: 2022-02-09 — End: 2022-02-09
  Administered 2022-02-09: 120 mg via INTRAVENOUS

## 2022-02-09 MED ORDER — INSULIN ASPART 100 UNIT/ML IJ SOLN
0.0000 [IU] | Freq: Every day | INTRAMUSCULAR | Status: DC
  Administered 2022-02-09: 2 [IU] via SUBCUTANEOUS

## 2022-02-09 MED ORDER — MIDAZOLAM HCL 2 MG/2ML IJ SOLN
INTRAMUSCULAR | Status: DC | PRN
  Administered 2022-02-09: 2 mg via INTRAVENOUS

## 2022-02-09 MED ORDER — LACTATED RINGERS IV SOLN: INTRAVENOUS | Status: DC

## 2022-02-09 MED ORDER — ROCURONIUM BROMIDE 10 MG/ML (PF) SYRINGE
PREFILLED_SYRINGE | INTRAVENOUS | Status: DC | PRN
  Administered 2022-02-09: 30 mg via INTRAVENOUS
  Administered 2022-02-09: 20 mg via INTRAVENOUS
  Administered 2022-02-09: 70 mg via INTRAVENOUS

## 2022-02-09 MED ORDER — LIDOCAINE HCL 1 % IJ SOLN
INTRAMUSCULAR | Status: DC | PRN
  Administered 2022-02-09: 10 mL

## 2022-02-09 MED ORDER — FENTANYL CITRATE (PF) 100 MCG/2ML IJ SOLN
INTRAMUSCULAR | Status: DC | PRN
  Administered 2022-02-09: 100 ug via INTRAVENOUS

## 2022-02-09 MED ORDER — FENTANYL CITRATE (PF) 100 MCG/2ML IJ SOLN
25.0000 ug | INTRAMUSCULAR | Status: DC | PRN
  Administered 2022-02-09: 25 ug via INTRAVENOUS

## 2022-02-09 MED ORDER — CHLORHEXIDINE GLUCONATE 0.12 % MT SOLN
15.0000 mL | Freq: Once | OROMUCOSAL | Status: AC
  Administered 2022-02-09: 15 mL via OROMUCOSAL
  Filled 2022-02-09: qty 15

## 2022-02-09 MED ORDER — OXYCODONE-ACETAMINOPHEN 5-325 MG PO TABS
1.0000 | ORAL_TABLET | ORAL | Status: AC | PRN
  Administered 2022-02-09 – 2022-02-10 (×4): 1 via ORAL
  Filled 2022-02-09 (×4): qty 1

## 2022-02-09 MED ORDER — ORAL CARE MOUTH RINSE: 15.0000 mL | Freq: Once | OROMUCOSAL | Status: AC

## 2022-02-09 MED ORDER — DEXAMETHASONE SODIUM PHOSPHATE 10 MG/ML IJ SOLN
INTRAMUSCULAR | Status: DC | PRN
  Administered 2022-02-09: 10 mg via INTRAVENOUS

## 2022-02-09 MED ORDER — SUGAMMADEX SODIUM 200 MG/2ML IV SOLN
INTRAVENOUS | Status: DC | PRN
  Administered 2022-02-09: 200 mg via INTRAVENOUS

## 2022-02-09 MED ORDER — LEVOFLOXACIN IN D5W 500 MG/100ML IV SOLN
500.0000 mg | INTRAVENOUS | Status: AC
  Administered 2022-02-09: 500 mg via INTRAVENOUS
  Filled 2022-02-09: qty 100

## 2022-02-09 MED ORDER — ONDANSETRON HCL 4 MG/2ML IJ SOLN
INTRAMUSCULAR | Status: DC | PRN
  Administered 2022-02-09: 4 mg via INTRAVENOUS

## 2022-02-09 MED ORDER — INSULIN ASPART 100 UNIT/ML IJ SOLN
0.0000 [IU] | Freq: Three times a day (TID) | INTRAMUSCULAR | Status: DC
  Administered 2022-02-10: 3 [IU] via SUBCUTANEOUS

## 2022-02-09 MED ORDER — LACTATED RINGERS IV SOLN: INTRAVENOUS | Status: DC | PRN

## 2022-02-09 MED ORDER — PHENYLEPHRINE 80 MCG/ML (10ML) SYRINGE FOR IV PUSH (FOR BLOOD PRESSURE SUPPORT)
PREFILLED_SYRINGE | INTRAVENOUS | Status: DC | PRN
  Administered 2022-02-09: 160 ug via INTRAVENOUS
  Administered 2022-02-09: 80 ug via INTRAVENOUS

## 2022-02-09 MED ORDER — SODIUM CHLORIDE 0.9 % IV SOLN: INTRAVENOUS | Status: DC

## 2022-02-09 MED ORDER — LIDOCAINE HCL 1 % IJ SOLN
INTRAMUSCULAR | Status: AC
  Filled 2022-02-09: qty 20

## 2022-02-09 MED ORDER — LACTULOSE 10 GM/15ML PO SOLN
10.0000 g | Freq: Three times a day (TID) | ORAL | Status: DC
Start: 2022-02-09 — End: 2022-02-10
  Administered 2022-02-09 – 2022-02-10 (×3): 10 g via ORAL
  Filled 2022-02-09 (×3): qty 15

## 2022-02-09 MED ORDER — LIDOCAINE 2% (20 MG/ML) 5 ML SYRINGE
INTRAMUSCULAR | Status: DC | PRN
  Administered 2022-02-09: 60 mg via INTRAVENOUS

## 2022-02-09 MED ORDER — FENTANYL CITRATE (PF) 100 MCG/2ML IJ SOLN
INTRAMUSCULAR | Status: AC
  Filled 2022-02-09: qty 2

## 2022-02-09 MED ORDER — ALBUMIN HUMAN 5 % IV SOLN: INTRAVENOUS | Status: DC | PRN

## 2022-02-09 MED ORDER — IOHEXOL 300 MG/ML  SOLN
100.0000 mL | Freq: Once | INTRAMUSCULAR | Status: AC | PRN
  Administered 2022-02-09: 100 mL via INTRAVENOUS

## 2022-02-09 MED ORDER — INSULIN ASPART 100 UNIT/ML IJ SOLN
INTRAMUSCULAR | Status: AC
  Filled 2022-02-09: qty 1

## 2022-02-09 MED ORDER — CLONAZEPAM 0.5 MG PO TABS
1.0000 mg | ORAL_TABLET | Freq: Three times a day (TID) | ORAL | Status: DC
  Administered 2022-02-09 – 2022-02-10 (×2): 1 mg via ORAL
  Filled 2022-02-09 (×2): qty 2

## 2022-02-09 NOTE — Sedation Documentation (Signed)
Pre TIPS portal vein pressure 36

## 2022-02-09 NOTE — Anesthesia Procedure Notes (Signed)
Arterial Line Insertion Start/End5/19/2023 7:40 AM, 02/09/2022 7:50 AM Performed by: Nils Pyle, CRNA, CRNA  Patient location: Pre-op. Preanesthetic checklist: patient identified, IV checked, site marked, risks and benefits discussed, surgical consent, monitors and equipment checked, pre-op evaluation and anesthesia consent Lidocaine 1% used for infiltration Left, radial was placed Catheter size: 20 G Hand hygiene performed  and maximum sterile barriers used   Attempts: 1 Procedure performed without using ultrasound guided technique. Ultrasound Notes:anatomy identified, needle tip was noted to be adjacent to the nerve/plexus identified and no ultrasound evidence of intravascular and/or intraneural injection Following insertion, dressing applied and Biopatch. Post procedure assessment: normal and unchanged  Patient tolerated the procedure well with no immediate complications.

## 2022-02-09 NOTE — Sedation Documentation (Signed)
Transferred to PACU and brief report given to PACU RN by self. Detailed report given by Shannan Harper CRNA. Dressings checked with PACU RN and self and right neck, right abdomen upper lateral and right groin dressings clean dry and intact.

## 2022-02-09 NOTE — Sedation Documentation (Signed)
Pressures post TIPS RV 15 and Portal vein was 26

## 2022-02-09 NOTE — Transfer of Care (Signed)
Immediate Anesthesia Transfer of Care Note  Patient: Melissa Huynh  Procedure(s) Performed: TIPS  Patient Location: PACU  Anesthesia Type:General  Level of Consciousness: awake, alert  and oriented  Airway & Oxygen Therapy: Patient Spontanous Breathing  Post-op Assessment: Report given to RN, Post -op Vital signs reviewed and stable and Patient moving all extremities X 4  Post vital signs: Reviewed and stable  Last Vitals:  Vitals Value Taken Time  BP 138/67 02/09/22 1148  Temp    Pulse 97 02/09/22 1151  Resp 12 02/09/22 1151  SpO2 97 % 02/09/22 1151  Vitals shown include unvalidated device data.  Last Pain:  Vitals:   02/09/22 0715  TempSrc:   PainSc: 9       Patients Stated Pain Goal: 2 (02/09/22 0715)  Complications: No notable events documented.

## 2022-02-09 NOTE — Plan of Care (Signed)

## 2022-02-09 NOTE — Procedures (Signed)
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 - Successful ICE-guided TIPS with placement of 7 cm Viatorr stent - Reduction of portal HTN with portosystemic gradient from 18 to 11 - Therapeutic paracentesis with 3.4 L of serous ascites removed.  Plan: - Admit to VIR service. - Bedrest x2 hrs and RLE straight. No HOB restrictions. - Advance diet as tolerated and resume Home medications. - Lactulose TID and monitor for hepatic encephalopathy. - NH3 in AM.   Final report to follow once all images are reviewed and compared with previous studies.  See detailed dictation with images in PACS. The patient tolerated the procedure well without incident or complication and was returned to PACU in stable condition.    Roanna Banning, MD [facility]   Pager. [phone] Clinic. [phone]

## 2022-02-09 NOTE — Anesthesia Procedure Notes (Signed)
Procedure Name: Intubation Date/Time: 02/09/2022 8:56 AM Performed by: Harden Mo, CRNA Pre-anesthesia Checklist: Patient identified, Emergency Drugs available, Suction available and Patient being monitored Patient Re-evaluated:Patient Re-evaluated prior to induction Oxygen Delivery Method: Circle System Utilized Preoxygenation: Pre-oxygenation with 100% oxygen Induction Type: IV induction Ventilation: Mask ventilation without difficulty Laryngoscope Size: Miller and 2 Grade View: Grade I Tube type: Oral Tube size: 7.0 mm Number of attempts: 1 Airway Equipment and Method: Stylet and Oral airway Placement Confirmation: ETT inserted through vocal cords under direct vision, positive ETCO2 and breath sounds checked- equal and bilateral Secured at: 22 cm Tube secured with: Tape Dental Injury: Teeth and Oropharynx as per pre-operative assessment

## 2022-02-09 NOTE — Sedation Documentation (Signed)
This procedure is an anesthesia case.

## 2022-02-09 NOTE — H&P (Signed)
Chief Complaint: Patient was seen in consultation today for Transjugular intrahepatic portal system shunt placement at the request of Dr Gilford Raid   Supervising Physician: Roanna Banning  Patient Status: [facility] - Out-pt  History of Present Illness: Melissa Huynh is a 61 y.o. female   Portal hypertension Hepatic cirrhosis with recurrent ascites Seen in consultation with Dr Milford Cage 01/10/22 for TIPS procedure Serial paracentesis Most recent 5/17: 7.5 L Doing well today No complaints Denies N/V Denies pain Denies SOB  HCV diagnosed in 2022, reportedly treated with (glecaprevir/pibrentasvir) Mayvret, who presented with decompensated cirrhosis in 10/2021. 1st paracentesis was 11/01/21 and Pt has been requiring q2wks paracenteses, most recently on 12/29/21 with 8L drawn. She had negative EGD + Colonoscopy on 11/30/21. She is closely followed by [facility]. She was started on diuretics (spironolactone  qD + furosemide  qD). She denies any variceal bleeding nor AMS. MELD 9 (11/01/21). Child-Pugh class B, 8 pts - moderately impared hepatic function. [place] Index of Post-TIPS Survival (FIPS) = 1 month / 98.3%, 3 Months / 94.8%, 6 Months / 92.6%. Favorable. TTE *Pending* Hepatic encephalopathy, subjective grading as described, Grade 0  Risks and benefits of TIPS were discussed with the patient and/or the patient's family including, but not limited to, infection, bleeding, damage to adjacent structures, worsening hepatic and/or cardiac function, worsening and/or the development of altered mental status/encephalopathy and death. The procedure has been fully reviewed with the patient/patient's authorized representative. She has consented to the procedure.  Scheduled today for TIPS in IR Plan for admit 24 hr observation post procedure   Past Medical History:  Diagnosis Date   Anxiety    Back pain    Cirrhosis (HCC)    Diabetes mellitus without complication (HCC)     DJD (degenerative joint disease) 09/24/2010   Hepatitis C    patient reports completing Mavyret December 1st 2022. HCV RNA <05 October 2021.   High cholesterol    Hypertension    Migraine     Past Surgical History:  Procedure Laterality Date   CESAREAN SECTION     CHOLECYSTECTOMY     COLONOSCOPY WITH PROPOFOL N/A 11/30/2021   Procedure: COLONOSCOPY WITH PROPOFOL;  Surgeon: [name], DO;  Location: [facility];  Service: Endoscopy;  Laterality: N/A;  9:15am   ESOPHAGOGASTRODUODENOSCOPY (EGD) WITH PROPOFOL N/A 11/30/2021   Procedure: ESOPHAGOGASTRODUODENOSCOPY (EGD) WITH PROPOFOL;  Surgeon: [name], DO;  Location: [facility];  Service: Endoscopy;  Laterality: N/A;   IR RADIOLOGIST EVAL & MGMT  01/10/2022   TONSILLECTOMY     TUBAL LIGATION      Allergies: Baclofen, Flexeril [cyclobenzaprine], Other, Penicillins, Toradol [ketorolac tromethamine], and Tramadol  Medications: Prior to Admission medications   Medication Sig Start Date End Date Taking? Authorizing Provider  acetaminophen (TYLENOL) 325 MG tablet Take 325 mg by mouth every 6 (six) hours as needed for moderate pain or headache.   Yes [provider]  amitriptyline (ELAVIL) 50 MG tablet Take 50 mg by mouth at bedtime as needed for sleep.   Yes [provider]  clonazePAM (KLONOPIN) 1 MG tablet Take 1 mg by mouth 3 (three) times daily. 10/23/21  Yes [provider]  Cyanocobalamin (B-12 PO) Take 1 tablet by mouth daily.   Yes [provider]  furosemide (LASIX) 40 MG tablet Take 1 tablet (40 mg total) by mouth daily. 11/30/21 05/29/22 Yes Carver, Hennie Duos, DO  gabapentin (NEURONTIN) 300 MG capsule Take 300 mg by mouth at bedtime as  needed (Burning feet). 10/27/21  Yes [provider]  metFORMIN (GLUCOPHAGE) 500 MG tablet Take 500 mg by mouth 2 (two) times daily.   Yes [provider]  Nutritional Supplements (NUTRITIONAL SHAKE HIGH PROTEIN) LIQD Take 1  Container by mouth 2 (two) times daily.   Yes [provider]  spironolactone (ALDACTONE) 100 MG tablet Take 1 tablet (100 mg total) by mouth daily. Patient taking differently: Take 25 mg by mouth daily. 11/30/21 05/29/22 Yes [name], DO     Family History  Problem Relation Age of Onset   Hypertension Mother    Cirrhosis Mother        Unknown etiology s/p transplant x2   Hypertension Sister    Breast cancer Sister    Lung cancer Sister     Social History   Socioeconomic History   Marital status: Married    Spouse name: Not on file   Number of children: Not on file   Years of education: Not on file   Highest education level: Not on file  Occupational History   Not on file  Tobacco Use   Smoking status: Every Day    Packs/day: 0.25    Years: 30.00    Pack years: 7.50    Types: Cigarettes   Smokeless tobacco: Never  Vaping Use   Vaping Use: Some days  Substance and Sexual Activity   Alcohol use: Never   Drug use: Never   Sexual activity: Yes    Birth control/protection: Surgical  Other Topics Concern   Not on file  Social History Narrative   Not on file   Social Determinants of Health   Financial Resource Strain: Not on file  Food Insecurity: Not on file  Transportation Needs: Not on file  Physical Activity: Not on file  Stress: Not on file  Social Connections: Not on file    Review of Systems: A 12 point ROS discussed and pertinent positives are indicated in the HPI above.  All other systems are negative.  Review of Systems  Constitutional:  Negative for activity change, fatigue and fever.  Respiratory:  Negative for cough and shortness of breath.   Cardiovascular:  Negative for chest pain.  Gastrointestinal:  Positive for abdominal distention. Negative for abdominal pain and nausea.  Musculoskeletal:  Negative for back pain.  Psychiatric/Behavioral:  Negative for behavioral problems and confusion.    Vital Signs: BP 128/74   Temp 97.7 F  (36.5 C) (Oral)   Resp 17   Ht 5\' 9"  (1.753 m)   Wt 168 lb 6.4 oz (76.4 kg)   LMP 01/18/2014   SpO2 96%   BMI 24.87 kg/m   Physical Exam Vitals reviewed.  HENT:     Mouth/Throat:     Mouth: Mucous membranes are moist.  Cardiovascular:     Rate and Rhythm: Normal rate and regular rhythm.     Heart sounds: Normal heart sounds.  Pulmonary:     Effort: Pulmonary effort is normal.     Breath sounds: Normal breath sounds.  Abdominal:     Palpations: Abdomen is soft.  Musculoskeletal:        General: Normal range of motion.  Skin:    General: Skin is warm.  Neurological:     Mental Status: She is alert and oriented to person, place, and time.  Psychiatric:        Mood and Affect: Mood normal.        Behavior: Behavior normal.  Thought Content: Thought content normal.        Judgment: Judgment normal.    Imaging: US Paracentesis  Result Date: 02/07/2022 INDICATION: Ascites EXAM: ULTRASOUND GUIDED LEFT PARACENTESIS MEDICATIONS: None. COMPLICATIONS: None immediate. PROCEDURE: The procedure was performed by Sheliah Plane, PA, and supervised by Gaylyn Rong, MD. Informed written consent was obtained from the patient after a discussion of the risks, benefits and alternatives to treatment. A timeout was performed prior to the initiation of the procedure. Initial ultrasound scanning demonstrates a large amount of ascites within the left lower abdominal quadrant. The left lower abdomen was prepped and draped in the usual sterile fashion. 1% lidocaine was used for local anesthesia. Ultrasound was utilized for targeting. Following this, a catheter was introduced. The paracentesis was performed. The catheter was removed and a dressing was applied. The patient tolerated the procedure well without immediate post procedural complication. FINDINGS: A total of approximately 7.5 L of straw-colored fluid was removed. Samples were sent to the laboratory as requested by the clinical team.  IMPRESSION: Successful ultrasound-guided paracentesis yielding 7.5 liters of peritoneal fluid. Electronically Signed   By: Gaylyn Rong M.D.   On: 02/07/2022 12:30   US Paracentesis  Result Date: 01/25/2022 INDICATION: Recurrent ascites; cirrhosis EXAM: ULTRASOUND GUIDED RLQ PARACENTESIS MEDICATIONS: 10 cc 1% lidocaine. COMPLICATIONS: None immediate. PROCEDURE: Informed written consent was obtained from the patient after a discussion of the risks, benefits and alternatives to treatment. A timeout was performed prior to the initiation of the procedure. Initial ultrasound scanning demonstrates a large amount of ascites within the right lower abdominal quadrant. The right lower abdomen was prepped and draped in the usual sterile fashion. 1% lidocaine was used for local anesthesia. Following this, a Yueh catheter was introduced. An ultrasound image was saved for documentation purposes. The paracentesis was performed. The catheter was removed and a dressing was applied. The patient tolerated the procedure well without immediate post procedural complication. Patient received post-procedure intravenous albumin; see nursing notes for details. FINDINGS: A total of approximately 6.7 liters of clear yellow fluid was removed. Samples were sent to the laboratory as requested by the clinical team. IMPRESSION: Successful ultrasound-guided paracentesis yielding 6.7 liters of peritoneal fluid. PLAN: The patient has previously been formally evaluated by the [facility] and is being actively followed for potential future intervention. She is scheduled for TIPs procedure with Dr Milford Cage 02/09/22 Read by Robet Leu [facility] Electronically Signed   By: Ulyses Southward M.D.   On: 01/25/2022 12:39   US Paracentesis  Result Date: 01/12/2022 INDICATION: Cirrhosis.  Ascites. EXAM: ULTRASOUND GUIDED DIAGNOSTIC AND THERAPEUTIC PARACENTESIS MEDICATIONS: None. COMPLICATIONS: None  immediate. PROCEDURE: Informed written consent was obtained from the patient after a discussion of the risks, benefits and alternatives to treatment. A timeout was performed prior to the initiation of the procedure. Initial ultrasound scanning demonstrates a large amount of ascites within the left lower abdominal quadrant. The left lower abdomen was prepped and draped in the usual sterile fashion. 1% lidocaine was used for local anesthesia. Following this, a Yueh catheter was introduced. An ultrasound image was saved for documentation purposes. The paracentesis was performed. The catheter was removed and a dressing was applied. The patient tolerated the procedure well without immediate post procedural complication. Patient received post-procedure intravenous albumin; see nursing notes for details. FINDINGS: A total of approximately 6.7 of clear yellow fluid was removed. Samples were sent to the laboratory as requested by the clinical team. IMPRESSION: 1. Successful ultrasound-guided paracentesis  yielding 6.7 liters of peritoneal fluid. Electronically Signed   By: Obie Dredge M.D.   On: 01/12/2022 12:16   ECHOCARDIOGRAM COMPLETE  Result Date: 02/08/2022    ECHOCARDIOGRAM REPORT   Patient Name:   Jelesa Mcquary Date of Exam: 02/08/2022 Medical Rec #:  [id number]    Height:       69.0 in Accession #:    [id number]   Weight:       168.4 lb Date of Birth:  Jul 17, 1961     BSA:          1.920 m Patient Age:    60 years     BP:           146/83 mmHg Patient Gender: F            HR:           75 bpm. [facility] Procedure: 2D Echo, Cardiac Doppler and Color Doppler Indications:    Preoperative evaluation  History:        Patient has no prior history of Echocardiogram examinations.                 Cirrhosis of Liver with ascites, Hep C.  Sonographer:    Mikki Harbor Referring Phys: [id number] JON MUGWERU IMPRESSIONS  1. Left ventricular ejection fraction, by estimation, is 60 to 65%. The left ventricle has  normal function. The left ventricle has no regional wall motion abnormalities. Left ventricular diastolic parameters were normal.  2. Right ventricular systolic function is normal. The right ventricular size is normal. There is normal pulmonary artery systolic pressure.  3. The mitral valve is normal in structure. Mild mitral valve regurgitation.  4. The aortic valve is tricuspid. Aortic valve regurgitation is not visualized.  5. The inferior vena cava is normal in size with greater than 50% respiratory variability, suggesting right atrial pressure of 3 mmHg. FINDINGS  Left Ventricle: Left ventricular ejection fraction, by estimation, is 60 to 65%. The left ventricle has normal function. The left ventricle has no regional wall motion abnormalities. The left ventricular internal cavity size was normal in size. There is  no left ventricular hypertrophy. Left ventricular diastolic parameters were normal. Right Ventricle: The right ventricular size is normal. Right vetricular wall thickness was not assessed. Right ventricular systolic function is normal. There is normal pulmonary artery systolic pressure. The tricuspid regurgitant velocity is 1.91 m/s, and with an assumed right atrial pressure of 3 mmHg, the estimated right ventricular systolic pressure is 17.6 mmHg. Left Atrium: Left atrial size was normal in size. Right Atrium: Right atrial size was normal in size. Pericardium: There is no evidence of pericardial effusion. Mitral Valve: The mitral valve is normal in structure. Mild mitral valve regurgitation. MV peak gradient, 3.2 mmHg. The mean mitral valve gradient is 2.0 mmHg. Tricuspid Valve: The tricuspid valve is normal in structure. Tricuspid valve regurgitation is trivial. Aortic Valve: The aortic valve is tricuspid. Aortic valve regurgitation is not visualized. Pulmonic Valve: The pulmonic valve was grossly normal. Pulmonic valve regurgitation is not visualized. Aorta: The aortic root and ascending aorta are  structurally normal, with no evidence of dilitation. Venous: The inferior vena cava is normal in size with greater than 50% respiratory variability, suggesting right atrial pressure of 3 mmHg. IAS/Shunts: The interatrial septum was not assessed.  LEFT VENTRICLE PLAX 2D LVIDd:         4.40 cm     Diastology LVIDs:  2.90 cm     LV e' medial:    10.90 cm/s LV PW:         1.00 cm     LV E/e' medial:  6.8 LV IVS:        1.10 cm     LV e' lateral:   12.60 cm/s LVOT diam:     2.00 cm     LV E/e' lateral: 5.9 LV SV:         75 LV SV Index:   39 LVOT Area:     3.14 cm  LV Volumes (MOD) LV vol d, MOD A2C: 52.4 ml LV vol d, MOD A4C: 54.7 ml LV vol s, MOD A2C: 19.6 ml LV vol s, MOD A4C: 14.9 ml LV SV MOD A2C:     32.8 ml LV SV MOD A4C:     54.7 ml LV SV MOD BP:      39.8 ml RIGHT VENTRICLE RV Basal diam:  3.05 cm RV Mid diam:    2.60 cm RV S prime:     12.30 cm/s TAPSE (M-mode): 2.8 cm LEFT ATRIUM             Index        RIGHT ATRIUM           Index LA diam:        3.70 cm 1.93 cm/m   RA Area:     11.90 cm LA Vol (A2C):   58.4 ml 30.41 ml/m  RA Volume:   25.00 ml  13.02 ml/m LA Vol (A4C):   34.7 ml 18.07 ml/m LA Biplane Vol: 47.6 ml 24.79 ml/m  AORTIC VALVE             PULMONIC VALVE LVOT Vmax:   112.00 cm/s PV Vmax:       0.96 m/s LVOT Vmean:  67.700 cm/s PV Peak grad:  3.7 mmHg LVOT VTI:    0.240 m  AORTA Ao Root diam: 2.80 cm MITRAL VALVE               TRICUSPID VALVE MV Area (PHT): 2.41 cm    TR Peak grad:   14.6 mmHg MV Area VTI:   2.72 cm    TR Vmax:        191.00 cm/s MV Peak grad:  3.2 mmHg MV Mean grad:  2.0 mmHg    SHUNTS MV Vmax:       0.90 m/s    Systemic VTI:  0.24 m MV Vmean:      60.5 cm/s   Systemic Diam: 2.00 cm MV Decel Time: 315 msec MV E velocity: 74.40 cm/s MV A velocity: 81.60 cm/s MV E/A ratio:  0.91 Dietrich Pates MD Electronically signed by Dietrich Pates MD Signature Date/Time: 02/08/2022/3:58:10 PM    Final    IR Radiologist Eval & Mgmt  Result Date: 01/10/2022 Please refer to notes tab  for details about interventional procedure. (Op Note)   Labs:  CBC: Recent Labs    11/01/21 2132 01/12/22 0900 02/09/22 0650  WBC 10.2 8.6 7.0  HGB 12.2 13.0 11.9*  HCT 37.0 39.6 35.2*  PLT 239 206 149*    COAGS: Recent Labs    11/01/21 2132 01/12/22 0900 01/31/22 1149 02/09/22 0650  INR 1.3* 1.2 1.1 1.2    BMP: Recent Labs    11/01/21 2132 11/28/21 1339 12/20/21 1051 01/12/22 0900 01/31/22 1149 02/09/22 0650  NA 135   < > 138 138 139 137  K 3.8   < >  3.8 3.2* 4.7 3.7  CL 104   < > 103 105 106 108  CO2 23   < > GLUCOSE 124*   < > 133* 106* 122* 185*  BUN 15   < > CALCIUM 8.6*   < > 9.2 8.8* 8.8 9.1  CREATININE 0.74   < > 0.58 0.64 0.59 0.76  GFRNONAA >60  --   --  >60  --  >60   < > = values in this interval not displayed.    LIVER FUNCTION TESTS: Recent Labs    11/01/21 2132 11/28/21 1339 12/05/21 0827 01/12/22 0900 01/31/22 1149 02/09/22 0650  BILITOT 0.8   < > 0.9 0.7 0.7 0.4  AST 47*   < > 36* 48* 51* 41  ALT 21   < > ALKPHOS 128*  --   --  153*  --  132*  PROT 7.1   < > 7.1 7.2 6.7 6.2*  ALBUMIN 2.7*  --   --  3.1*  --  3.2*   < > = values in this interval not displayed.    TUMOR MARKERS: Recent Labs    11/15/21 1046  AFPTM 15.5*    Assessment and Plan:  Portal HTN; recurrent ascites Scheduled for Transjugular intrahepatic portal system shunt placement today Risks and benefits of TIPS, BRTO and/or additional variceal embolization were discussed with the patient and/or the patient's family including, but not limited to, infection, bleeding, damage to adjacent structures, worsening hepatic and/or cardiac function, worsening and/or the development of altered mental status/encephalopathy, non-target embolization and death.   This interventional procedure involves the use of X-rays and because of the nature of the planned procedure, it is possible that we will have prolonged use of X-ray  fluoroscopy.  Potential radiation risks to you include (but are not limited to) the following: - A slightly elevated risk for cancer  several years later in life. This risk is typically less than 0.5% percent. This risk is low in comparison to the normal incidence of human cancer, which is 33% for women and 50% for men according to the [employer] Society. - Radiation induced injury can include skin redness, resembling a rash, tissue breakdown / ulcers and hair loss (which can be temporary or permanent).   The likelihood of either of these occurring depends on the difficulty of the procedure and whether you are sensitive to radiation due to previous procedures, disease, or genetic conditions.   IF your procedure requires a prolonged use of radiation, you will be notified and given written instructions for further action.  It is your responsibility to monitor the irradiated area for the 2 weeks following the procedure and to notify your physician if you are concerned that you have suffered a radiation induced injury.    All of the patient's questions were answered, patient is agreeable to proceed.  Consent signed and in chart.  Pt is aware when will be admitted overnight observation - plan for DC in am  She is agreeable  Thank you for this interesting consult.  I greatly enjoyed meeting Melissa Huynh and look forward to participating in their care.  A copy of this report was sent to the requesting provider on this date.  Electronically Signed: Robet Leu, PA-C 02/09/2022, 8:03 AM   I spent a total of  30 Minutes   in face to face in clinical consultation, greater than 50% of  which was counseling/coordinating care for TIPS

## 2022-02-09 NOTE — Progress Notes (Signed)
Patient requested for me to remove foley cath because it was hurting and very uncomfortable. I removed it.

## 2022-02-09 NOTE — Progress Notes (Signed)
Patient arrived to room. Alert and oriented. Husband in room. Dressing on right groin CDI. Bed alarm on call light within reach. Patient resting.

## 2022-02-09 NOTE — Sedation Documentation (Signed)
Pre TIPS RA pressure 18

## 2022-02-09 NOTE — Progress Notes (Signed)
Interventional Radiology Brief Note:  Patient assessed this afternoon alongside Dr. Milford Cage.  She is stable after TIPS procedure this AM.  She has been able to eat with tolerance. She complains of back pain which she has at baseline, however was exacerbated by procedure/lying flat today.   Patient procedure sites assessed.  Sites are clean, dry, intact.   Plan for ammonia in the AM.  Patient to start lactulose.    Melissa Dys, MS RD PA-C

## 2022-02-10 DIAGNOSIS — K7031 Alcoholic cirrhosis of liver with ascites: Secondary | ICD-10-CM | POA: Diagnosis not present

## 2022-02-10 LAB — TYPE AND SCREEN
ABO/RH(D): AB POS
Antibody Screen: NEGATIVE
Unit division: 0
Unit division: 0

## 2022-02-10 LAB — POCT I-STAT, CHEM 8
BUN: 12 mg/dL (ref 6–20)
Calcium, Ion: 1.19 mmol/L (ref 1.15–1.40)
Chloride: 105 mmol/L (ref 98–111)
Creatinine, Ser: 0.6 mg/dL (ref 0.44–1.00)
Glucose, Bld: 95 mg/dL (ref 70–99)
HCT: 35 % — ABNORMAL LOW (ref 36.0–46.0)
Hemoglobin: 11.9 g/dL — ABNORMAL LOW (ref 12.0–15.0)
Potassium: 4.1 mmol/L (ref 3.5–5.1)
Sodium: 140 mmol/L (ref 135–145)
TCO2: 25 mmol/L (ref 22–32)

## 2022-02-10 LAB — BPAM RBC
Blood Product Expiration Date: 202306052359
Blood Product Expiration Date: 202306152359
Unit Type and Rh: 8400
Unit Type and Rh: 8400

## 2022-02-10 LAB — AMMONIA: Ammonia: 96 umol/L — ABNORMAL HIGH (ref 9–35)

## 2022-02-10 MED ORDER — OXYCODONE HCL 5 MG PO TABS: 5.0000 mg | ORAL_TABLET | Freq: Four times a day (QID) | ORAL | 0 refills | Status: AC | PRN

## 2022-02-10 MED ORDER — OXYCODONE-ACETAMINOPHEN 7.5-325 MG PO TABS
1.0000 | ORAL_TABLET | Freq: Four times a day (QID) | ORAL | 0 refills | Status: AC | PRN
Start: 2022-02-10 — End: ?

## 2022-02-10 MED ORDER — LACTULOSE 10 GM/15ML PO SOLN: 10.0000 g | Freq: Three times a day (TID) | ORAL | 0 refills | Status: DC

## 2022-02-10 NOTE — Plan of Care (Signed)
  Problem: Nutrition: Goal: Adequate nutrition will be maintained Outcome: Progressing   Problem: Pain Managment: Goal: General experience of comfort will improve Outcome: Progressing   Problem: Safety: Goal: Ability to remain free from injury will improve Outcome: Progressing   

## 2022-02-10 NOTE — Discharge Instructions (Signed)
May resume home medications, stay well-hydrated, avoid driving after taking narcotic agents, take narcotic agents with food if used, avoid strenuous activity for the next 3 to 4 days

## 2022-02-10 NOTE — Discharge Summary (Addendum)
Patient ID: Melissa Huynh MRN: [id number] DOB/AGE: [birth year] 61 y.o.  Admit date: 02/09/2022 Discharge date: 02/10/2022  Supervising Physician: Roanna Banning  Patient Status: [facility] - In-pt  Admission Diagnoses: HCV cirrhosis, portal hypertension, refractory ascites  Discharge Diagnoses: HCV cirrhosis, portal hypertension, refractory ascites, status post paracentesis and TIPS creation on 02/09/2022  Principal Problem:   Cirrhosis of liver [facility])  Past Medical History:  Diagnosis Date   Anxiety    Back pain    Cirrhosis (HCC)    Diabetes mellitus without complication (HCC)    DJD (degenerative joint disease) 09/24/2010   Hepatitis C    patient reports completing Mavyret December 1st 2022. HCV RNA <05 October 2021.   High cholesterol    Hypertension    Migraine    Past Surgical History:  Procedure Laterality Date   CESAREAN SECTION     CHOLECYSTECTOMY     COLONOSCOPY WITH PROPOFOL N/A 11/30/2021   Procedure: COLONOSCOPY WITH PROPOFOL;  Surgeon: [name], DO;  [facility];  Service: Endoscopy;  Laterality: N/A;  9:15am   ESOPHAGOGASTRODUODENOSCOPY (EGD) WITH PROPOFOL N/A 11/30/2021   Procedure: ESOPHAGOGASTRODUODENOSCOPY (EGD) WITH PROPOFOL;  Surgeon: [name], DO;  [facility];  Service: Endoscopy;  Laterality: N/A;   IR IVUS EACH ADDITIONAL NON CORONARY VESSEL  02/09/2022   IR PARACENTESIS  02/09/2022   IR RADIOLOGIST EVAL & MGMT  01/10/2022   IR TIPS  02/09/2022   IR US GUIDE VASC ACCESS RIGHT  02/09/2022   TONSILLECTOMY     TUBAL LIGATION       Discharged Condition: good  Hospital Course: Melissa Huynh is a 61 year old female with past medical history of chronic back pain, HCV cirrhosis, portal hypertension, recurrent ascites, diabetes, DJD, hypercholesterolemia, and hypertension who underwent therapeutic paracentesis and TIPS creation on 02/09/2022 at [facility] by Dr. Milford Cage. She had been previously seen in consultation on 01/10/2022 by Dr.  Milford Cage.  The above procedure was performed with general anesthesia and she was subsequently admitted to the hospital for overnight observation.  Overnight she did well with exception of some minor discomfort at access sites right neck and right groin regions and some mild chronic back pain. On the morning of discharge she was awake, alert.  She denied fever, headache, chest pain, dyspnea, cough, worsening abdominal pain, nausea, vomiting or bleeding.  She was able to tolerate her diet and void without difficulty.  Follow-up ammonia was 96.  Vital signs were stable.  Findings were discussed with Dr. Milford Cage and she was deemed stable for discharge home at this time.  She  was instructed to resume her home medications.  Electronic prescriptions were sent into patient's pharmacy for percocet (pharmacy out of oxycodone) and lactulose.  Our team will follow up with her within 1 month. She was  told to contact our service with any additional questions or concerns.  Consults: anesthesi  Significant Diagnostic Studies:  Results for orders placed or performed during the hospital encounter of 02/09/22  CBC  Result Value Ref Range   WBC 7.0 4.0 - 10.5 K/uL   RBC 3.98 3.87 - 5.11 MIL/uL   Hemoglobin 11.9 (L) 12.0 - 15.0 g/dL   HCT 46.9 (L) 62.9 - 52.8 %   MCV 88.4 80.0 - 100.0 fL   MCH 29.9 26.0 - 34.0 pg   MCHC 33.8 30.0 - 36.0 g/dL   RDW 41.3 24.4 - 01.0 %   Platelets 149 (L) 150 - 400 K/uL   nRBC 0.0 0.0 -  0.2 %  Protime-INR  Result Value Ref Range   Prothrombin Time 14.9 11.4 - 15.2 seconds   INR 1.2 0.8 - 1.2  Comprehensive metabolic panel  Result Value Ref Range   Sodium 137 135 - 145 mmol/L   Potassium 3.7 3.5 - 5.1 mmol/L   Chloride 108 98 - 111 mmol/L   CO2 23 22 - 32 mmol/L   Glucose, Bld 185 (H) 70 - 99 mg/dL   BUN 13 6 - 20 mg/dL   Creatinine, Ser 0.86 0.44 - 1.00 mg/dL   Calcium 9.1 8.9 - 57.8 mg/dL   Total Protein 6.2 (L) 6.5 - 8.1 g/dL   Albumin 3.2 (L) 3.5 - 5.0 g/dL   AST 41 15  - 41 U/L   ALT 25 0 - 44 U/L   Alkaline Phosphatase 132 (H) 38 - 126 U/L   Total Bilirubin 0.4 0.3 - 1.2 mg/dL   GFR, Estimated >46 >96 mL/min   Anion gap 6 5 - 15  Glucose, capillary  Result Value Ref Range   Glucose-Capillary 186 (H) 70 - 99 mg/dL  Glucose, capillary  Result Value Ref Range   Glucose-Capillary 200 (H) 70 - 99 mg/dL   Comment 1 Notify RN   Ammonia  Result Value Ref Range   Ammonia 96 (H) 9 - 35 umol/L  Glucose, capillary  Result Value Ref Range   Glucose-Capillary 246 (H) 70 - 99 mg/dL  ABO/Rh  Result Value Ref Range   ABO/RH(D)      AB POS Performed at [facility], [address], [place], [place] [address]   Prepare RBC (crossmatch)  Result Value Ref Range   Order Confirmation      ORDER PROCESSED BY BLOOD BANK Performed at [facility], [address] [place], [place] [address]      Treatments: Therapeutic paracentesis and TIPS creation via general anesthesia on 02/09/2022  Discharge Exam: Blood pressure (!) 108/54, pulse 69, temperature (!) 97.5 F (36.4 C), temperature source Axillary, resp. rate 19, height 5\' 9"  (1.753 m), weight 168 lb 6.4 oz (76.4 kg), last menstrual period 01/18/2014, SpO2 97 %. Patient awake, alert.  Chest clear to auscultation bilaterally.  Heart with regular rate and rhythm.  Abdomen soft, mildly distended, positive bowel sounds, nontender.  No significant lower extremity edema.  Puncture sites right IJ, right lateral abdominal and right  groin regions clean, dry, no hematomas  Disposition: Discharge disposition: 01-Home or Self Care       Discharge Instructions     Call MD for:  difficulty breathing, headache or visual disturbances   Complete by: As directed    Call MD for:  extreme fatigue   Complete by: As directed    Call MD for:  hives   Complete by: As directed    Call MD for:  persistant dizziness or light-headedness   Complete by: As directed    Call MD for:  persistant nausea and vomiting    Complete by: As directed    Call MD for:  redness, tenderness, or signs of infection (pain, swelling, redness, odor or green/yellow discharge around incision site)   Complete by: As directed    Call MD for:  severe uncontrolled pain   Complete by: As directed    Call MD for:  temperature >100.4   Complete by: As directed    Change dressing (specify)   Complete by: As directed    Monitor previous puncture sites in the neck and groin  for any possible swelling or bleeding.  Wash sites with soap and water.   Diet - low sodium heart healthy   Complete by: As directed    Continue with your diabetic diet   Discharge instructions   Complete by: As directed    May resume home medications, stay well-hydrated, avoid driving after taking narcotic agents, avoid strenuous activity for the next 3 to 4 days   Driving Restrictions   Complete by: As directed    No driving for the next 24 hours or after taking narcotic agents   Increase activity slowly   Complete by: As directed    Lifting restrictions   Complete by: As directed    No heavy lifting for the next 3 to 4 days   May shower / Bathe   Complete by: As directed    May walk up steps   Complete by: As directed       Allergies as of 02/10/2022       Reactions   Baclofen    Heart flutters    Flexeril [cyclobenzaprine] Other (See Comments)   Tachycardia   Other    Pt says she doesn't like steroids because it makes her sugar elevated.   Penicillins Hives   Has patient had a PCN reaction causing immediate rash, facial/tongue/throat swelling, SOB or lightheadedness with hypotension: Yes Has patient had a PCN reaction causing severe rash involving mucus membranes or skin necrosis: No Has patient had a PCN reaction that required hospitalization No Has patient had a PCN reaction occurring within the last 10 years: No If all of the above answers are "NO", then may proceed with Cephalosporin use.   Toradol [ketorolac Tromethamine] Rash    Tramadol Rash        Medication List     TAKE these medications    acetaminophen 325 MG tablet Commonly known as: TYLENOL Take 325 mg by mouth every 6 (six) hours as needed for moderate pain or headache.   amitriptyline 50 MG tablet Commonly known as: ELAVIL Take 50 mg by mouth at bedtime as needed for sleep.   B-12 PO Take 1 tablet by mouth daily.   clonazePAM 1 MG tablet Commonly known as: KLONOPIN Take 1 mg by mouth 3 (three) times daily.   furosemide 40 MG tablet Commonly known as: Lasix Take 1 tablet (40 mg total) by mouth daily.   gabapentin 300 MG capsule Commonly known as: NEURONTIN Take 300 mg by mouth at bedtime as needed (Burning feet).   lactulose 10 GM/15ML solution Commonly known as: CHRONULAC Take 15 mLs (10 g total) by mouth 3 (three) times daily.   metFORMIN 500 MG tablet Commonly known as: GLUCOPHAGE Take 500 mg by mouth 2 (two) times daily.   Nutritional Shake High Protein Liqd Take 1 Container by mouth 2 (two) times daily.   oxyCODONE 5 MG immediate release tablet Commonly known as: Roxicodone Take 1 tablet (5 mg total) by mouth every 6 (six) hours as needed for severe pain or moderate pain.   spironolactone 100 MG tablet Commonly known as: Aldactone Take 1 tablet (100 mg total) by mouth daily. What changed: how much to take               Discharge Care Instructions  (From admission, onward)           Start     Ordered   02/10/22 0000  Change dressing (specify)       Comments: Monitor previous puncture sites in  the neck and groin for any possible swelling or bleeding.  Wash sites with soap and water.   02/10/22 2353            Follow-up Information     Mugweru, Cletis Athens, MD Follow up.   Specialties: Interventional Radiology, Diagnostic Radiology, Radiology Why: Radiology team will be following up with you in the next month.  Call [phone] or [phone] with any questions Contact information: [address] [place] [address] [phone]         Jenel Lucks, MD Follow up.   Specialty: Gastroenterology Why: Follow-up with Dr. Tomasa Rand as scheduled Contact information: [address] [place] [address] [phone]                  Electronically Signed: D. Jeananne Rama, PA-C 02/10/2022, 9:55 AM   I have spent Less Than 30 Minutes discharging Melissa Huynh.

## 2022-02-10 NOTE — Progress Notes (Signed)
Patient's husband smoked in patient's bathroom. I informed him not to smoke in the bathroom, charge nurse made aware.

## 2022-02-11 ENCOUNTER — Encounter (HOSPITAL_COMMUNITY): Payer: Self-pay | Admitting: Interventional Radiology

## 2022-02-12 LAB — GLUCOSE, CAPILLARY
Glucose-Capillary: 119 mg/dL — ABNORMAL HIGH (ref 70–99)
Glucose-Capillary: 192 mg/dL — ABNORMAL HIGH (ref 70–99)
Glucose-Capillary: 201 mg/dL — ABNORMAL HIGH (ref 70–99)

## 2022-02-12 LAB — CULTURE, BODY FLUID W GRAM STAIN -BOTTLE: Culture: NO GROWTH

## 2022-02-12 LAB — PATHOLOGIST SMEAR REVIEW

## 2022-02-12 NOTE — Anesthesia Postprocedure Evaluation (Signed)
Anesthesia Post Note  Patient: Melissa Huynh  Procedure(s) Performed: TIPS     Patient location during evaluation: PACU Anesthesia Type: General Level of consciousness: awake and alert Pain management: pain level controlled Vital Signs Assessment: post-procedure vital signs reviewed and stable Respiratory status: spontaneous breathing, nonlabored ventilation, respiratory function stable and patient connected to nasal cannula oxygen Cardiovascular status: blood pressure returned to baseline and stable Postop Assessment: no apparent nausea or vomiting Anesthetic complications: no   No notable events documented.  Last Vitals:  Vitals:   02/10/22 0451 02/10/22 0739  BP: (!) 102/49 (!) 108/54  Pulse: 72 69  Resp: 17 19  Temp: 36.6 C (!) 36.4 C  SpO2: 98% 97%    Last Pain:  Vitals:   02/10/22 0853  TempSrc:   PainSc: 0-No pain                 Akasia Ahmad L Iceis Knab

## 2022-02-13 ENCOUNTER — Other Ambulatory Visit: Payer: Self-pay | Admitting: Interventional Radiology

## 2022-02-13 DIAGNOSIS — K7031 Alcoholic cirrhosis of liver with ascites: Secondary | ICD-10-CM

## 2022-02-14 ENCOUNTER — Telehealth: Payer: Self-pay | Admitting: Gastroenterology

## 2022-02-14 NOTE — Telephone Encounter (Signed)
Patient called asking when she is supposed to come back to see kristen for an appointment

## 2022-02-15 NOTE — Telephone Encounter (Signed)
She is on recall to see Dr. Marletta Lor in 3 months. We can try to get her back in about 8 weeks to see Dr. Marletta Lor to follow-up s/p TIPS.   Stacey: Please arrange f/u with Dr. Marletta Lor in 8 weeks.

## 2022-02-15 NOTE — Telephone Encounter (Signed)
Spoke to pt, she informed me that her surgery went well. Informed her to call if she needs [place].

## 2022-02-16 NOTE — Telephone Encounter (Signed)
Noted  

## 2022-02-20 ENCOUNTER — Telehealth: Payer: Self-pay | Admitting: Student

## 2022-02-20 ENCOUNTER — Encounter: Payer: Self-pay | Admitting: Internal Medicine

## 2022-02-20 MED ORDER — LACTULOSE 10 GM/15ML PO SOLN: 10.0000 g | Freq: Three times a day (TID) | ORAL | Status: AC

## 2022-02-20 NOTE — Telephone Encounter (Signed)
   [facility] TIPS post-procedure phone call follow-up   Melissa Huynh is a 61 y.o. female who underwent TIPS creation at [facility] on 02/09/22 by Dr. Maryelizabeth Kaufmann. She was discharged home 02/10/22  Patient is 11 days from procedure.   # of paracentesis in last month: 3 (one para during TIPS) # of paracentesis in last 2 months: 5  Current diuretic regimen: Spironolactone 100 mg daily Current pharmacologic encephalopathy prophylaxis/treatment: Lactulose 10 grams TID  Post-TIPS Imaging: None   Labs: 02/10/22 Creatinine: 1.6 Total Bilirubin: 0.4 INR: 1.2 Sodium: 137 Albumin: 3.2 Ammonia: 96  Assessment: Melissa Huynh is a 61 y.o. female with history of cirrhosis. Patient is 11 days weeks from TIPS creation and states she feels really well. She has some days where she is tired but all in all feels great, has a better appetite and is using the bathroom regularly/without difficulty. She has been taking her medications as directed but is almost out of lactulose. A refill has been e-prescribed to her pharmacy in [place]. Three week's worth of lactulose has been e-prescribed and she has been encouraged to call her GI doctor to set up a follow up appointment.   Recommendation:  Continue current treatment plan with next Clinic telephone follow up scheduled for 02/27/22.  Electronically Signed: Theresa Duty, NP 02/20/2022, 3:57 PM

## 2022-02-22 ENCOUNTER — Telehealth: Payer: Self-pay | Admitting: Student

## 2022-02-22 MED ORDER — LACTULOSE 10 GM/15ML PO SOLN
10.0000 g | Freq: Three times a day (TID) | ORAL | Status: AC
Start: 2022-02-22 — End: 2022-03-15

## 2022-02-22 NOTE — Telephone Encounter (Signed)
Lactulose prescription was not successfully e-prescribed. It was called in to the pharmacy today.  Alwyn Ren, [place] [phone] 02/22/2022, 11:46 AM

## 2022-03-07 ENCOUNTER — Ambulatory Visit
Admission: RE | Admit: 2022-03-07 | Discharge: 2022-03-07 | Disposition: A | Discharge status: Home / Self Care | Payer: Medicaid Other | Source: Ambulatory Visit | Attending: Radiology | Admitting: Radiology

## 2022-03-07 ENCOUNTER — Encounter: Payer: Self-pay | Admitting: *Deleted

## 2022-03-07 DIAGNOSIS — K7031 Alcoholic cirrhosis of liver with ascites: Secondary | ICD-10-CM

## 2022-03-07 HISTORY — PX: IR RADIOLOGIST EVAL & MGMT: IMG5224

## 2022-03-07 NOTE — Progress Notes (Addendum)
Reason for visit: The patient is seen in follow up today s/p TIPS placement  Care Team(s): Primary Care: Practice, [facility]: Letta Median PA and Dr. Marletta Lor  Virtual Visit via Telephone Note   I connected with Mrs. Melissa Huynh at 1400hrs EST by telephone and verified that I am speaking with the correct person using two identifiers.   I discussed the limitations, risks, security and privacy concerns of performing an evaluation and management service by telephone and the availability of in-person appointments.  She is joined in the visit by her husband, Maisie Fus.   History of present illness:  61 y.o. female w Hx significant for HCV diagnosed in 2022, reportedly treated with (glecaprevir/pibrentasvir) Mayvret, who presented with decompensated cirrhosis in 10/2021. Pt suffered from refractory ascites requiring serial LVPs q2wks despite diuretics.  She is closely followed by [facility].  She underwent ICE-guided TIPS and LVP (3.4L) on 02/09/22. Pre-TIPS MELD was 9. She is taking lactulose 10mg  TID and same dose of diuretics  diuretics (spironolactone 100mg  qD + furosemide 40mg  qD). She denies any concern on todays visit and reports feeling "100% better". She has not required additional paracentesis since the procedure. No H/E, bleeding concern, N/V/D.   Review of Systems: A 12-point ROS discussed, and pertinent positives are indicated in the HPI above.  All other systems are negative.   Past Medical History:  Diagnosis Date   Anxiety    Back pain    Cirrhosis (HCC)    Diabetes mellitus without complication (HCC)    DJD (degenerative joint disease) 09/24/2010   Hepatitis C    patient reports completing Mavyret December 1st 2022. HCV RNA <05 October 2021.   High cholesterol    Hypertension    Migraine     Past Surgical History:  Procedure Laterality Date   CESAREAN SECTION     CHOLECYSTECTOMY     COLONOSCOPY WITH PROPOFOL  N/A 11/30/2021   Procedure: COLONOSCOPY WITH PROPOFOL;  Surgeon: 12-13-1969, DO;  [facility];  Service: Endoscopy;  Laterality: N/A;  9:15am   ESOPHAGOGASTRODUODENOSCOPY (EGD) WITH PROPOFOL N/A 11/30/2021   Procedure: ESOPHAGOGASTRODUODENOSCOPY (EGD) WITH PROPOFOL;  Surgeon: 01/30/2022, DO;  [facility];  Service: Endoscopy;  Laterality: N/A;   IR IVUS EACH ADDITIONAL NON CORONARY VESSEL  02/09/2022   IR PARACENTESIS  02/09/2022   IR RADIOLOGIST EVAL & MGMT  01/10/2022   IR RADIOLOGIST EVAL & MGMT  03/07/2022   IR TIPS  02/09/2022   IR 01/12/2022 GUIDE VASC ACCESS RIGHT  02/09/2022   RADIOLOGY WITH ANESTHESIA N/A 02/09/2022   Procedure: TIPS;  Surgeon: [place], MD;  Location: [facility] OR;  Service: Radiology;  Laterality: N/A;   TONSILLECTOMY     TUBAL LIGATION      Allergies: Baclofen, Flexeril [cyclobenzaprine], Other, Penicillins, Toradol [ketorolac tromethamine], and Tramadol  Medications: Prior to Admission medications   Medication Sig Start Date End Date Taking? Authorizing Provider  acetaminophen (TYLENOL) 325 MG tablet Take 325 mg by mouth every 6 (six) hours as needed for moderate pain or headache.    [provider]  amitriptyline (ELAVIL) 50 MG tablet Take 50 mg by mouth at bedtime as needed for sleep.    [provider]  clonazePAM (KLONOPIN) 1 MG tablet Take 1 mg by mouth 3 (three) times daily. 10/23/21   [provider]  Cyanocobalamin (B-12 PO) Take 1 tablet by mouth daily.    [provider]  furosemide (LASIX) 40  MG tablet Take 1 tablet (40 mg total) by mouth daily. 11/30/21 05/29/22  Lanelle Balarver, Charles K, DO  gabapentin (NEURONTIN) 300 MG capsule Take 300 mg by mouth at bedtime as needed (Burning feet). 10/27/21   [provider]  lactulose (CHRONULAC) 10 GM/15ML solution Take 15 mLs (10 g total) by mouth 3 (three) times daily. 02/10/22   Allred, Darrell K, PA-C  metFORMIN (GLUCOPHAGE) 500 MG tablet Take 500 mg by  mouth 2 (two) times daily.    [provider]  Nutritional Supplements (NUTRITIONAL SHAKE HIGH PROTEIN) LIQD Take 1 Container by mouth 2 (two) times daily.    [provider]  oxyCODONE (ROXICODONE) 5 MG immediate release tablet Take 1 tablet (5 mg total) by mouth every 6 (six) hours as needed for severe pain or moderate pain. 02/10/22   Allred, Rosalita Levanarrell K, PA-C  oxyCODONE-acetaminophen (PERCOCET) 7.5-325 MG tablet Take 1 tablet by mouth every 6 (six) hours as needed for severe pain. 02/10/22   Allred, Rosalita Levanarrell K, PA-C  spironolactone (ALDACTONE) 100 MG tablet Take 1 tablet (100 mg total) by mouth daily. Patient taking differently: Take 25 mg by mouth daily. 11/30/21 05/29/22  Lanelle Balarver, Charles K, DO     Family History  Problem Relation Age of Onset   Hypertension Mother    Cirrhosis Mother        Unknown etiology s/p transplant x2   Hypertension Sister    Breast cancer Sister    Lung cancer Sister     Social History   Socioeconomic History   Marital status: Married    Spouse name: Not on file   Number of children: Not on file   Years of education: Not on file   Highest education level: Not on file  Occupational History   Not on file  Tobacco Use   Smoking status: Every Day    Packs/day: 0.25    Years: 30.00    Total pack years: 7.50    Types: Cigarettes   Smokeless tobacco: Never  Vaping Use   Vaping Use: Some days  Substance and Sexual Activity   Alcohol use: Never   Drug use: Never   Sexual activity: Yes    Birth control/protection: Surgical  Other Topics Concern   Not on file  Social History Narrative   Not on file   Social Determinants of Health   Financial Resource Strain: Not on file  Food Insecurity: Not on file  Transportation Needs: Not on file  Physical Activity: Not on file  Stress: Not on file  Social Connections: Not on file    Vital Signs: LMP 01/18/2014   Physical Exam  Deferred secondary to virtual visit.  Imaging:  TIPS,  02/09/22 Independently reviewed, demonstrating well positioned TIPS stent  Labs:  CBC: Recent Labs    11/01/21 2132 01/12/22 0900 02/09/22 0650 02/09/22 1034  WBC 10.2 8.6 7.0  --   HGB 12.2 13.0 11.9* 11.9*  HCT 37.0 39.6 35.2* 35.0*  PLT 239 206 149*  --     COAGS: Recent Labs    11/01/21 2132 01/12/22 0900 01/31/22 1149 02/09/22 0650  INR 1.3* 1.2 1.1 1.2    BMP: Recent Labs    11/01/21 2132 11/28/21 1339 12/20/21 1051 01/12/22 0900 01/31/22 1149 02/09/22 0650 02/09/22 1034  NA 135   < > 138 138 139 137 140  K 3.8   < > 3.8 3.2* 4.7 3.7 4.1  CL 104   < > 103 105 106 108 105  CO2 23   < >  26 26 28 23   --   GLUCOSE 124*   < > 133* 106* 122* 185* 95  BUN 15   < > 15 16 13 13 12   CALCIUM 8.6*   < > 9.2 8.8* 8.8 9.1  --   CREATININE 0.74   < > 0.58 0.64 0.59 0.76 0.60  GFRNONAA >60  --   --  >60  --  >60  --    < > = values in this interval not displayed.    LIVER FUNCTION TESTS: Recent Labs    11/01/21 2132 11/28/21 1339 12/05/21 0827 01/12/22 0900 01/31/22 1149 02/09/22 0650  BILITOT 0.8   < > 0.9 0.7 0.7 0.4  AST 47*   < > 36* 48* 51* 41  ALT 21   < > 21 26 25 25   ALKPHOS 128*  --   --  153*  --  132*  PROT 7.1   < > 7.1 7.2 6.7 6.2*  ALBUMIN 2.7*  --   --  3.1*  --  3.2*   < > = values in this interval not displayed.    Assessment and Plan:  60 y/o F w Hx of recently diagnosed HCV cirrhosis (decompensated) w portal HTN and refractory ascites, requiring serial therapeutic LVPs.    Successful ICE-guided TIPS and LVP (3.4L) on 02/09/22. No additional paracentesis since procedure. Pre TIPS MELD 9 (11/01/21) and H/E Grade 0. Post TIPS MELD 8 (02/09/22) and H/E Grade 0.  Current diuretic regimen: spironolactone 100mg  qD + furosemide 40mg  qD Current pharmacologic encephalopathy prophylaxis/treatment: lactulose 10mg  TID  *No concern at this time. *continue diuretic mgmt and lactulose as prescribed *Follow up with GI scheduled within the next  month. *TIPS Doppler in 6 months, this can be performed in [place]. *Return to [facility] Clinic PRN   Thank you for allowing 02/11/22 to participate in the care of this Patient. Signed:  12/30/21, MD [facility] Radiology  Pager. [phone] Clinic. [phone]     I spent a total of 25 Minutes of non-face-to-face time in clinical consultation, greater than 50% of which was counseling/coordinating care for Mrs. evaluation for portal hypertension management and TIPS follow up.

## 2022-04-04 ENCOUNTER — Encounter: Payer: Self-pay | Admitting: Internal Medicine

## 2022-04-04 ENCOUNTER — Ambulatory Visit: Payer: Medicaid Other | Admitting: Internal Medicine

## 2022-04-09 ENCOUNTER — Encounter: Payer: Self-pay | Admitting: Internal Medicine

## 2022-05-08 ENCOUNTER — Telehealth: Payer: Self-pay | Admitting: Internal Medicine

## 2022-05-08 NOTE — Telephone Encounter (Signed)
Pt has [employer] [employer] medicaid until the end of this month and then she'll have [employer] medicaid (we don't accept). She is aware of her OV for next Wednesday and has question about taking a certain medication or not. Please call [phone]

## 2022-05-08 NOTE — Telephone Encounter (Signed)
Spoke to pt, she would like a refill of lactulose sent to [facility] in [place].

## 2022-05-11 ENCOUNTER — Other Ambulatory Visit: Payer: Self-pay | Admitting: Internal Medicine

## 2022-05-11 MED ORDER — LACTULOSE 10 GM/15ML PO SOLN
10.0000 g | Freq: Three times a day (TID) | ORAL | 11 refills | Status: AC
Start: 2022-05-11 — End: 2023-05-11

## 2022-05-11 NOTE — Telephone Encounter (Signed)
Refill sent to pharmacy. Thank you.

## 2022-05-14 NOTE — Telephone Encounter (Signed)
Noted  

## 2022-05-15 NOTE — Progress Notes (Deleted)
Referring Provider: [facility] Primary Care Physician:  [facility] Primary GI Physician: Dr. Abbey Chatters  No chief complaint on file.   HPI:   Melissa Huynh is a 61 y.o. female presenting today for follow-up.  She has history of decompensated cirrhosis related to chronic hep C s/p treatment with Mavyret with PCP achieving SVR, and nonalcoholic fatty liver disease.  Cirrhosis has been complicated by portal hypertension with ascites requiring frequent paracentesis despite diuretics and ultimately underwent TIPS on 02/09/2022.  Also history of hepatic encephalopathy, managed with lactulose.  MRI in March due to elevated AFP (15.5) showed cirrhotic morphology, portal hypertension with ascites, no hepatoma.   EGD 11/30/2021 with portal hypertensive gastropathy.  No varices.  Repeat in 2 years.  Colonoscopy 11/30/2021 without polyps.  On 10-year recall for screening purposes.  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  Serologic work-up for cirrhosis did show elevated IgG  2307, ANA slightly positive with a 1:40 titer and cytoplasmic pattern, ASMA negative.  Ceruloplasmin normal, immune to hepatitis A, no immunity to hepatitis B, AFP elevated 15.5. AMA negative, no alpha 1 antitrypsin deficiency. Had recommended considering liver biopsy at the time of TIPS, but this was not completed.   We have not seen patient since 01/31/22, prior to TIPS.  She had follow-up with IR on 6/14 and was doing very well at that time, continuing on spironolactone 100 mg and furosemide 40 mg daily.  No symptoms of hepatic encephalopathy on lactulose.  Recommended TIPS Doppler in 6 months.  Today:     She has had mild AST and alk phos elevation. AST normalized 5/19.  Alk phos 132.   MELD Na 8 in May.  Update labs now post tips.  [place] in September.   Alcohol: Never. Illicit drug use: Never  Mother had 2 liver transplants. Unknown etiology of cirrhosis.   No personal or Fhx of autoimmune conditions.   Past Medical History:  Diagnosis Date   Anxiety    Back pain    Cirrhosis ([place])    Diabetes mellitus without complication ([place])    DJD (degenerative joint disease) 09/24/2010   Hepatitis C    patient reports completing Mavyret December 1st 2022. HCV RNA <05 October 2021.   High cholesterol    Hypertension    Migraine     Past Surgical History:  Procedure Laterality Date   CESAREAN SECTION     CHOLECYSTECTOMY     COLONOSCOPY WITH PROPOFOL N/A 11/30/2021   Procedure: COLONOSCOPY WITH PROPOFOL;  Surgeon: Eloise Harman, DO;  [facility];  Service: Endoscopy;  Laterality: N/A;  9:15am   ESOPHAGOGASTRODUODENOSCOPY (EGD) WITH PROPOFOL N/A 11/30/2021   Procedure: ESOPHAGOGASTRODUODENOSCOPY (EGD) WITH PROPOFOL;  Surgeon: Eloise Harman, DO;  [facility];  Service: Endoscopy;  Laterality: N/A;   IR IVUS EACH ADDITIONAL NON CORONARY VESSEL  02/09/2022   IR PARACENTESIS  02/09/2022   IR RADIOLOGIST EVAL & MGMT  01/10/2022   IR RADIOLOGIST EVAL & MGMT  03/07/2022   IR TIPS  02/09/2022   IR US GUIDE VASC ACCESS RIGHT  02/09/2022   RADIOLOGY WITH ANESTHESIA N/A 02/09/2022   Procedure: TIPS;  Surgeon: Michaelle Birks, MD;  Location: [place];  Service: Radiology;  Laterality: N/A;   TONSILLECTOMY     TUBAL LIGATION      Current Outpatient Medications  Medication Sig Dispense Refill   acetaminophen (TYLENOL) 325 MG tablet Take 325 mg by  mouth every 6 (six) hours as needed for moderate pain or headache.     amitriptyline (ELAVIL) 50 MG tablet Take 50 mg by mouth at bedtime as needed for sleep.     clonazePAM (KLONOPIN) 1 MG tablet Take 1 mg by mouth 3 (three) times daily.     Cyanocobalamin (B-12 PO) Take 1 tablet by mouth daily.     furosemide (LASIX) 40 MG tablet Take 1 tablet (40 mg total) by mouth daily. 30 tablet 5   gabapentin (NEURONTIN) 300 MG capsule Take 300 mg by mouth at bedtime as needed (Burning feet).      lactulose (CHRONULAC) 10 GM/15ML solution Take 15 mLs (10 g total) by mouth 3 (three) times daily. 1350 mL 11   metFORMIN (GLUCOPHAGE) 500 MG tablet Take 500 mg by mouth 2 (two) times daily.     Nutritional Supplements (NUTRITIONAL SHAKE HIGH PROTEIN) LIQD Take 1 Container by mouth 2 (two) times daily.     oxyCODONE (ROXICODONE) 5 MG immediate release tablet Take 1 tablet (5 mg total) by mouth every 6 (six) hours as needed for severe pain or moderate pain. 15 tablet 0   oxyCODONE-acetaminophen (PERCOCET) 7.5-325 MG tablet Take 1 tablet by mouth every 6 (six) hours as needed for severe pain. 15 tablet 0   spironolactone (ALDACTONE) 100 MG tablet Take 1 tablet (100 mg total) by mouth daily. (Patient taking differently: Take 25 mg by mouth daily.) 30 tablet 5   No current facility-administered medications for this visit.    Allergies as of 05/16/2022 - Review Complete 02/09/2022  Allergen Reaction Noted   Baclofen  11/22/2021   Flexeril [cyclobenzaprine] Other (See Comments) 02/11/2013   Other  05/15/2016   Penicillins Hives 02/11/2013   Toradol [ketorolac tromethamine] Rash 02/11/2013   Tramadol Rash 02/11/2013    Family History  Problem Relation Age of Onset   Hypertension Mother    Cirrhosis Mother        Unknown etiology s/p transplant x2   Hypertension Sister    Breast cancer Sister    Lung cancer Sister     Social History   Socioeconomic History   Marital status: Married    Spouse name: Not on file   Number of children: Not on file   Years of education: Not on file   Highest education level: Not on file  Occupational History   Not on file  Tobacco Use   Smoking status: Every Day    Packs/day: 0.25    Years: 30.00    Total pack years: 7.50    Types: Cigarettes   Smokeless tobacco: Never  Vaping Use   Vaping Use: Some days  Substance and Sexual Activity   Alcohol use: Never   Drug use: Never   Sexual activity: Yes    Birth control/protection: Surgical  Other  Topics Concern   Not on file  Social History Narrative   Not on file   Social Determinants of Health   Financial Resource Strain: Not on file  Food Insecurity: Not on file  Transportation Needs: Not on file  Physical Activity: Not on file  Stress: Not on file  Social Connections: Not on file    Review of Systems: Gen: Denies fever, chills, cold or flu like symptoms, pre-syncope, or syncope.  CV: Denies chest pain, palpitations. Resp: Denies dyspnea, cough. GI: See HPI Heme: See HPI  Physical Exam: LMP 01/18/2014  General:   Alert and oriented. No distress noted. Pleasant and cooperative.  Head:  Normocephalic  and atraumatic. Eyes:  Conjuctiva clear without scleral icterus. Heart:  S1, S2 present without murmurs appreciated. Lungs:  Clear to auscultation bilaterally. No wheezes, rales, or rhonchi. No distress.  Abdomen:  +BS, soft, non-tender and non-distended. No rebound or guarding. No HSM or masses noted. Msk:  Symmetrical without gross deformities. Normal posture. Extremities:  Without edema. Neurologic:  Alert and  oriented x4 Psych:  Normal mood and affect.    Assessment:     Plan:  ***   Aliene Altes, PA-C [facility] 05/16/2022

## 2022-05-16 ENCOUNTER — Ambulatory Visit: Payer: Medicaid Other | Admitting: Gastroenterology

## 2022-05-18 ENCOUNTER — Telehealth: Payer: Medicaid Other | Admitting: Gastroenterology

## 2022-05-31 ENCOUNTER — Telehealth: Payer: Self-pay | Admitting: Gastroenterology

## 2022-05-31 NOTE — Telephone Encounter (Signed)
Pt left our office a message about her 9/18 appt. Asking [place] to call her back.  I have tried twice but the mailbox is full.

## 2022-06-09 NOTE — Progress Notes (Deleted)
Referring Provider: [facility] Primary Care Physician:  [facility] Primary GI Physician: Dr. Abbey Chatters  No chief complaint on file.   HPI:   Melissa Huynh is a 61 y.o. female presenting today for follow-up.   She has history of decompensated cirrhosis related to chronic hep C s/p treatment with Mavyret with PCP achieving SVR, and nonalcoholic fatty liver disease.  Cirrhosis has been complicated by portal hypertension with ascites requiring frequent paracentesis despite diuretics and ultimately underwent TIPS on 02/09/2022.  Also history of hepatic encephalopathy, managed with lactulose.  MRI in March due to elevated AFP (15.5) showed cirrhotic morphology, portal hypertension with ascites, no hepatoma.    EGD 11/30/2021 with portal hypertensive gastropathy.  No varices.  Repeat in 2 years.   Colonoscopy 11/30/2021 without polyps.  On 10-year recall for screening purposes.  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  Serologic work-up for cirrhosis did show elevated IgG  2307, ANA slightly positive with a 1:40 titer and cytoplasmic pattern, ASMA negative, AMA negative.  Ceruloplasmin normal, immune to hepatitis A, no immunity to hepatitis B, AFP elevated 15.5. No alpha 1 antitrypsin deficiency. Had recommended considering liver biopsy at the time of TIPS, but this was not completed.   We have not seen patient since 01/31/22, prior to TIPS.  She had follow-up with IR on 6/14 and was doing very well at that time, continuing on spironolactone 100 mg and furosemide 40 mg daily.  No symptoms of hepatic encephalopathy on lactulose.  Recommended TIPS Doppler in 6 months.  Today:    She has had mild AST and alk phos elevation. AST normalized 5/19.  Alk phos 132.    MELD Na 8 in May.  Update labs and AFP now post tips.  [place] vs MRI now    Alcohol: Never. Illicit drug use: Never   Mother had 2 liver transplants. Unknown etiology of  cirrhosis.  No personal or Fhx of autoimmune conditions.     Past Medical History:  Diagnosis Date   Anxiety    Back pain    Cirrhosis ([place])    Diabetes mellitus without complication ([place])    DJD (degenerative joint disease) 09/24/2010   Hepatitis C    patient reports completing Mavyret December 1st 2022. HCV RNA <05 October 2021.   High cholesterol    Hypertension    Migraine     Past Surgical History:  Procedure Laterality Date   CESAREAN SECTION     CHOLECYSTECTOMY     COLONOSCOPY WITH PROPOFOL N/A 11/30/2021   Procedure: COLONOSCOPY WITH PROPOFOL;  Surgeon: Eloise Harman, DO;  [facility];  Service: Endoscopy;  Laterality: N/A;  9:15am   ESOPHAGOGASTRODUODENOSCOPY (EGD) WITH PROPOFOL N/A 11/30/2021   Procedure: ESOPHAGOGASTRODUODENOSCOPY (EGD) WITH PROPOFOL;  Surgeon: Eloise Harman, DO;  [facility];  Service: Endoscopy;  Laterality: N/A;   IR IVUS EACH ADDITIONAL NON CORONARY VESSEL  02/09/2022   IR PARACENTESIS  02/09/2022   IR RADIOLOGIST EVAL & MGMT  01/10/2022   IR RADIOLOGIST EVAL & MGMT  03/07/2022   IR TIPS  02/09/2022   IR US GUIDE VASC ACCESS RIGHT  02/09/2022   RADIOLOGY WITH ANESTHESIA N/A 02/09/2022   Procedure: TIPS;  Surgeon: Michaelle Birks, MD;  Location: [place];  Service: Radiology;  Laterality: N/A;   TONSILLECTOMY     TUBAL LIGATION      Current Outpatient Medications  Medication Sig Dispense Refill  acetaminophen (TYLENOL) 325 MG tablet Take 325 mg by mouth every 6 (six) hours as needed for moderate pain or headache.     amitriptyline (ELAVIL) 50 MG tablet Take 50 mg by mouth at bedtime as needed for sleep.     clonazePAM (KLONOPIN) 1 MG tablet Take 1 mg by mouth 3 (three) times daily.     Cyanocobalamin (B-12 PO) Take 1 tablet by mouth daily.     furosemide (LASIX) 40 MG tablet Take 1 tablet (40 mg total) by mouth daily. 30 tablet 5   gabapentin (NEURONTIN) 300 MG capsule Take 300 mg by mouth at bedtime as needed (Burning  feet).     lactulose (CHRONULAC) 10 GM/15ML solution Take 15 mLs (10 g total) by mouth 3 (three) times daily. 1350 mL 11   metFORMIN (GLUCOPHAGE) 500 MG tablet Take 500 mg by mouth 2 (two) times daily.     Nutritional Supplements (NUTRITIONAL SHAKE HIGH PROTEIN) LIQD Take 1 Container by mouth 2 (two) times daily.     oxyCODONE (ROXICODONE) 5 MG immediate release tablet Take 1 tablet (5 mg total) by mouth every 6 (six) hours as needed for severe pain or moderate pain. 15 tablet 0   oxyCODONE-acetaminophen (PERCOCET) 7.5-325 MG tablet Take 1 tablet by mouth every 6 (six) hours as needed for severe pain. 15 tablet 0   spironolactone (ALDACTONE) 100 MG tablet Take 1 tablet (100 mg total) by mouth daily. (Patient taking differently: Take 25 mg by mouth daily.) 30 tablet 5   No current facility-administered medications for this visit.    Allergies as of 06/11/2022 - Review Complete 02/09/2022  Allergen Reaction Noted   Baclofen  11/22/2021   Flexeril [cyclobenzaprine] Other (See Comments) 02/11/2013   Other  05/15/2016   Penicillins Hives 02/11/2013   Toradol [ketorolac tromethamine] Rash 02/11/2013   Tramadol Rash 02/11/2013    Family History  Problem Relation Age of Onset   Hypertension Mother    Cirrhosis Mother        Unknown etiology s/p transplant x2   Hypertension Sister    Breast cancer Sister    Lung cancer Sister     Social History   Socioeconomic History   Marital status: Married    Spouse name: Not on file   Number of children: Not on file   Years of education: Not on file   Highest education level: Not on file  Occupational History   Not on file  Tobacco Use   Smoking status: Every Day    Packs/day: 0.25    Years: 30.00    Total pack years: 7.50    Types: Cigarettes   Smokeless tobacco: Never  Vaping Use   Vaping Use: Some days  Substance and Sexual Activity   Alcohol use: Never   Drug use: Never   Sexual activity: Yes    Birth control/protection: Surgical   Other Topics Concern   Not on file  Social History Narrative   Not on file   Social Determinants of Health   Financial Resource Strain: Not on file  Food Insecurity: Not on file  Transportation Needs: Not on file  Physical Activity: Not on file  Stress: Not on file  Social Connections: Not on file    Review of Systems: Gen: Denies fever, chills, cold or flulike symptoms, presyncope, syncope. CV: Denies chest pain, palpitations. Resp: Denies dyspnea, cough.  GI: See HPI Derm: Denies rash. Psych: Denies depression, anxiety. Heme: See HPI  Physical Exam: LMP 01/18/2014  General:  Alert and oriented. No distress noted. Pleasant and cooperative.  Head:  Normocephalic and atraumatic. Eyes:  Conjuctiva clear without scleral icterus. Heart:  S1, S2 present without murmurs appreciated. Lungs:  Clear to auscultation bilaterally. No wheezes, rales, or rhonchi. No distress.  Abdomen:  +BS, soft, non-tender and non-distended. No rebound or guarding. No HSM or masses noted. Msk:  Symmetrical without gross deformities. Normal posture. Extremities:  Without edema. Neurologic:  Alert and  oriented x4 Psych:  Normal mood and affect.    Assessment:     Plan:  ***   Aliene Altes, PA-C [facility] 06/11/2022

## 2022-06-11 ENCOUNTER — Encounter: Payer: Self-pay | Admitting: Gastroenterology

## 2022-06-11 ENCOUNTER — Ambulatory Visit: Payer: Medicaid Other | Admitting: Gastroenterology

## 2022-08-01 ENCOUNTER — Other Ambulatory Visit: Payer: Self-pay | Admitting: Interventional Radiology

## 2022-08-01 DIAGNOSIS — K7031 Alcoholic cirrhosis of liver with ascites: Secondary | ICD-10-CM

## 2022-10-16 ENCOUNTER — Encounter: Payer: Self-pay | Admitting: Interventional Radiology

## 2022-10-18 ENCOUNTER — Encounter: Payer: Self-pay | Admitting: Interventional Radiology

## 2022-10-18 ENCOUNTER — Other Ambulatory Visit: Payer: Self-pay | Admitting: Interventional Radiology

## 2022-11-09 ENCOUNTER — Telehealth: Payer: Self-pay

## 2022-11-09 NOTE — Telephone Encounter (Signed)
Attempted a PA for the pt's Lasix. No updated insurance information in the system. Phoned the pt and her phone just continuously rang with no answer

## 2022-11-22 ENCOUNTER — Encounter: Payer: Self-pay | Admitting: Radiology

## 2023-03-21 ENCOUNTER — Other Ambulatory Visit (HOSPITAL_COMMUNITY): Payer: Self-pay | Admitting: Internal Medicine

## 2023-03-21 DIAGNOSIS — R772 Abnormality of alphafetoprotein: Secondary | ICD-10-CM

## 2023-03-21 DIAGNOSIS — K7460 Unspecified cirrhosis of liver: Secondary | ICD-10-CM

## 2023-03-21 DIAGNOSIS — R7989 Other specified abnormal findings of blood chemistry: Secondary | ICD-10-CM

## 2023-04-16 ENCOUNTER — Ambulatory Visit (HOSPITAL_COMMUNITY)
Admission: RE | Admit: 2023-04-16 | Discharge: 2023-04-16 | Disposition: A | Discharge status: Home / Self Care | Payer: Medicaid - Out of State | Source: Ambulatory Visit | Attending: Internal Medicine | Admitting: Internal Medicine

## 2023-04-16 DIAGNOSIS — R772 Abnormality of alphafetoprotein: Secondary | ICD-10-CM | POA: Insufficient documentation

## 2023-04-16 DIAGNOSIS — K7460 Unspecified cirrhosis of liver: Secondary | ICD-10-CM | POA: Insufficient documentation

## 2023-04-16 DIAGNOSIS — R7989 Other specified abnormal findings of blood chemistry: Secondary | ICD-10-CM | POA: Insufficient documentation

## 2023-07-30 ENCOUNTER — Other Ambulatory Visit (HOSPITAL_COMMUNITY): Payer: Self-pay | Admitting: Interventional Radiology

## 2023-07-30 DIAGNOSIS — K7031 Alcoholic cirrhosis of liver with ascites: Secondary | ICD-10-CM

## 2023-11-29 ENCOUNTER — Encounter: Payer: Self-pay | Admitting: *Deleted
